# Patient Record
Sex: Male | Born: 1995 | Race: White | Hispanic: No | Marital: Single | Smoking: Former smoker
Health system: Southern US, Community
[De-identification: ages and names within clinical notes are randomized; demographics above are authoritative.]

## PROBLEM LIST (undated history)

## (undated) DIAGNOSIS — J45909 Unspecified asthma, uncomplicated: Secondary | ICD-10-CM

## (undated) HISTORY — PX: LEG SURGERY: SHX1003

---
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
## 2020-12-06 ENCOUNTER — Emergency Department (HOSPITAL_COMMUNITY): Payer: Medicaid Other

## 2020-12-06 ENCOUNTER — Emergency Department (HOSPITAL_COMMUNITY)
Admission: EM | Admit: 2020-12-06 | Discharge: 2020-12-07 | Disposition: A | Discharge status: Home / Self Care | Payer: Medicaid Other | Attending: Emergency Medicine | Admitting: Emergency Medicine

## 2020-12-06 DIAGNOSIS — R52 Pain, unspecified: Secondary | ICD-10-CM

## 2020-12-06 DIAGNOSIS — W1789XA Other fall from one level to another, initial encounter: Secondary | ICD-10-CM | POA: Insufficient documentation

## 2020-12-06 DIAGNOSIS — S93401A Sprain of unspecified ligament of right ankle, initial encounter: Secondary | ICD-10-CM | POA: Insufficient documentation

## 2020-12-06 DIAGNOSIS — S93402A Sprain of unspecified ligament of left ankle, initial encounter: Secondary | ICD-10-CM | POA: Insufficient documentation

## 2020-12-06 MED ORDER — IBUPROFEN 400 MG PO TABS
600.0000 mg | ORAL_TABLET | Freq: Once | ORAL | Status: AC
  Administered 2020-12-06: 600 mg via ORAL

## 2020-12-06 NOTE — ED Provider Notes (Signed)
Emergency Medicine Provider Triage Evaluation Note  [name] , a 25 y.o. male  was evaluated in triage.  Pt complains of who presents with concern for bilateral ankle swelling and pain after he tripped off of the porch and heard "popping sound" from both ankles.  Immediate pain and swelling to both ankles as well as the left foot, denies numbness, tingling, able to wiggle toes.  Not on any medications every day.  Review of Systems  Positive: Ankle swelling bilaterally, pain, left foot pain Negative: Numbness, tingling  Physical Exam  BP 140/90 (BP Location: Right Arm)   Pulse 91   Temp 98.7 F (37.1 C) (Oral)   Resp 18   SpO2 97%  Gen:   Awake, visibly uncomfortable due to ankle pain Resp:  Normal effort  MSK:   Moves extremities without difficulty  Other:  Swelling over bilateral lateral malleoli and left medial malleoli, tenderness palpation of her left distal, lateral metatarsals, normal cap refill in all 10 digits, 2+ pedal pulses bilaterally.  Medical Decision Making  Medically screening exam initiated at 11:28 PM.  Appropriate orders placed.  Christo Hain was informed that the remainder of the evaluation will be completed by another provider, this initial triage assessment does not replace that evaluation, and the importance of remaining in the ED until their evaluation is complete.  This chart was dictated using voice recognition software, Dragon. Despite the best efforts of this provider to proofread and correct errors, errors may still occur which can change documentation meaning.    Sherrilee Gilles 12/06/20 2329    Koleen Distance, MD 12/07/20 1539

## 2020-12-06 NOTE — ED Triage Notes (Signed)
Pt here via [facility], missed step & "twisted both ankles." Pulses, sensation intact distally.

## 2020-12-07 MED ORDER — KETOROLAC TROMETHAMINE 60 MG/2ML IM SOLN
60.0000 mg | Freq: Once | INTRAMUSCULAR | Status: AC
  Administered 2020-12-07: 60 mg via INTRAMUSCULAR
  Filled 2020-12-07: qty 2

## 2020-12-07 NOTE — ED Notes (Signed)
Ortho was called, and is coming

## 2020-12-07 NOTE — Progress Notes (Signed)
Orthopedic Tech Progress Note Patient Details:  [name] [birth year] [id number]  Ortho Devices Type of Ortho Device: ASO Ortho Device/Splint Location: bi-lateral Ortho Device/Splint Interventions: Ordered, Application, Adjustment   Post Interventions Patient Tolerated: Well Instructions Provided: Care of device, Adjustment of device  Trinna Post 12/07/2020, 5:08 AM

## 2020-12-07 NOTE — Discharge Instructions (Addendum)
1. Medications: alternate ibuprofen and tylenol for pain control, usual home medications 2. Treatment: rest, ice, elevate and use brace, drink plenty of fluids, gentle stretching 3. Follow Up: Please followup with orthopedics as directed or your PCP in 1 week if no improvement for discussion of your diagnoses and further evaluation after today's visit; if you do not have a primary care doctor use the resource guide provided to find one; Please return to the ER for worsening symptoms or other concerns  

## 2020-12-07 NOTE — ED Provider Notes (Signed)
[facility] EMERGENCY DEPARTMENT Provider Note   CSN: [id number] Arrival date & time: 12/06/20  2322     History Chief Complaint  Patient presents with   Ankle Pain    [name] is a 25 y.o. male presents to the Emergency Department complaining of acute, persistent bilateral ankle pain.  Patient reports he stumbled and fell off of approximately 1 foot high deck landing on his feet.  He reports that when he landed both feet inverted and he fell.  Denies hitting his head or loss of consciousness.  Reports he heard a popping noise in his ankles and has had pain since that time.  No treatments prior to arrival.  Movement and palpation make his symptoms worse.  Nothing seems to make it better.  Denies previous history of fracture.  The history is provided by the patient and medical records. No language interpreter was used.      No past medical history on file.  There are no problems to display for this patient.    No family history on file.     Home Medications Prior to Admission medications   Not on File    Allergies    Patient has no allergy information on record.  Review of Systems   Review of Systems  Constitutional:  Negative for chills and fever.  Musculoskeletal:  Positive for arthralgias and joint swelling.  Skin:  Negative for wound.   Physical Exam Updated Vital Signs BP 140/90 (BP Location: Right Arm)   Pulse 91   Temp 98.7 F (37.1 C) (Oral)   Resp 18   SpO2 97%   Physical Exam Vitals and nursing note reviewed.  Constitutional:      General: He is not in acute distress.    Appearance: He is well-developed. He is not ill-appearing.  HENT:     Head: Normocephalic.  Eyes:     General: No scleral icterus.    Conjunctiva/sclera: Conjunctivae normal.  Cardiovascular:     Rate and Rhythm: Normal rate.  Pulmonary:     Effort: Pulmonary effort is normal.  Abdominal:     General: There is no distension.  Musculoskeletal:      Cervical back: Normal range of motion.     Right lower leg: Normal.     Left lower leg: Normal.     Right ankle: Swelling present. Tenderness present. Normal range of motion. Normal pulse.     Right Achilles Tendon: No tenderness. Thompson's test negative.     Left ankle: Swelling present. Tenderness present. Normal range of motion. Normal pulse.     Left Achilles Tendon: No tenderness. Thompson's test negative.     Right foot: Normal.     Left foot: Normal.  Skin:    General: Skin is warm and dry.  Neurological:     Mental Status: He is alert.  Psychiatric:        Mood and Affect: Mood normal.    ED Results / Procedures / Treatments    Radiology DG Ankle Complete Left  Result Date: 12/07/2020 CLINICAL DATA:  Fall EXAM: LEFT ANKLE COMPLETE - 3+ VIEW COMPARISON:  None. FINDINGS: There is no evidence of fracture, dislocation, or joint effusion. There is no evidence of arthropathy or other focal bone abnormality. Soft tissues are unremarkable. IMPRESSION: Negative. Electronically Signed   By: Deatra Robinson M.D.   On: 12/07/2020 01:02   DG Ankle Complete Right  Result Date: 12/07/2020 CLINICAL DATA:  Fall EXAM: RIGHT ANKLE -  COMPLETE 3+ VIEW COMPARISON:  None. FINDINGS: There is no evidence of fracture, dislocation, or joint effusion. There is no evidence of arthropathy or other focal bone abnormality. Soft tissues are unremarkable. IMPRESSION: Negative. Electronically Signed   By: Deatra Robinson M.D.   On: 12/07/2020 01:01   DG Hand Complete Right  Result Date: 12/07/2020 CLINICAL DATA:  Fall EXAM: RIGHT HAND - COMPLETE 3+ VIEW COMPARISON:  None. FINDINGS: There is no evidence of fracture or dislocation. There is no evidence of arthropathy or other focal bone abnormality. Soft tissues are unremarkable. IMPRESSION: Negative. Electronically Signed   By: Deatra Robinson M.D.   On: 12/07/2020 01:05   DG Foot Complete Left  Result Date: 12/07/2020 CLINICAL DATA:  Fall EXAM: LEFT FOOT - COMPLETE 3+  VIEW COMPARISON:  None. FINDINGS: There is no evidence of fracture or dislocation. There is no evidence of arthropathy or other focal bone abnormality. Soft tissues are unremarkable. IMPRESSION: Negative. Electronically Signed   By: Deatra Robinson M.D.   On: 12/07/2020 01:04    Procedures Procedures   Medications Ordered in ED Medications  ibuprofen (ADVIL) tablet 600 mg (600 mg Oral Given 12/06/20 2333)  ketorolac (TORADOL) injection 60 mg (60 mg Intramuscular Given 12/07/20 0301)    ED Course  I have reviewed the triage vital signs and the nursing notes.  Pertinent labs & imaging results that were available during my care of the patient were reviewed by me and considered in my medical decision making (see chart for details).    MDM Rules/Calculators/A&P                          Tender to palpation over the bilateral lateral malleoli.  Full range of motion, normal sensation and strength 5/5 on exam.  Patient X-Ray negative for obvious fracture or dislocation. Pain managed in ED. Pt advised to follow up with orthopedics if symptoms persist for possibility of missed fracture diagnosis. Patient given brace while in ED, conservative therapy recommended and discussed. Patient will be dc home & is agreeable with above plan.  Final Clinical Impression(s) / ED Diagnoses Final diagnoses:  Sprain of right ankle, unspecified ligament, initial encounter  Sprain of left ankle, unspecified ligament, initial encounter    Rx / DC Orders ED Discharge Orders     None        Wyn Nettle, Boyd Kerbs 12/07/20 0317    Sabas Sous, MD 12/07/20 [phone]

## 2021-08-01 ENCOUNTER — Other Ambulatory Visit: Payer: Self-pay

## 2021-08-01 ENCOUNTER — Emergency Department (HOSPITAL_BASED_OUTPATIENT_CLINIC_OR_DEPARTMENT_OTHER): Payer: 59

## 2021-08-01 ENCOUNTER — Inpatient Hospital Stay (HOSPITAL_BASED_OUTPATIENT_CLINIC_OR_DEPARTMENT_OTHER)
Admission: EM | Admit: 2021-08-01 | Discharge: 2021-08-04 | DRG: 871 | Disposition: A | Discharge status: Home / Self Care | Payer: 59 | Attending: Student | Admitting: Student

## 2021-08-01 ENCOUNTER — Encounter (HOSPITAL_BASED_OUTPATIENT_CLINIC_OR_DEPARTMENT_OTHER): Payer: Self-pay | Admitting: Emergency Medicine

## 2021-08-01 DIAGNOSIS — J45901 Unspecified asthma with (acute) exacerbation: Secondary | ICD-10-CM | POA: Diagnosis not present

## 2021-08-01 DIAGNOSIS — F17200 Nicotine dependence, unspecified, uncomplicated: Secondary | ICD-10-CM | POA: Diagnosis not present

## 2021-08-01 DIAGNOSIS — A419 Sepsis, unspecified organism: Secondary | ICD-10-CM | POA: Diagnosis present

## 2021-08-01 DIAGNOSIS — E66813 Obesity, class 3: Secondary | ICD-10-CM

## 2021-08-01 DIAGNOSIS — R0602 Shortness of breath: Secondary | ICD-10-CM | POA: Diagnosis not present

## 2021-08-01 DIAGNOSIS — Z20822 Contact with and (suspected) exposure to covid-19: Secondary | ICD-10-CM | POA: Diagnosis present

## 2021-08-01 DIAGNOSIS — J4541 Moderate persistent asthma with (acute) exacerbation: Secondary | ICD-10-CM | POA: Diagnosis present

## 2021-08-01 DIAGNOSIS — J159 Unspecified bacterial pneumonia: Secondary | ICD-10-CM

## 2021-08-01 DIAGNOSIS — F17210 Nicotine dependence, cigarettes, uncomplicated: Secondary | ICD-10-CM | POA: Diagnosis present

## 2021-08-01 DIAGNOSIS — J180 Bronchopneumonia, unspecified organism: Secondary | ICD-10-CM | POA: Diagnosis present

## 2021-08-01 DIAGNOSIS — R0902 Hypoxemia: Secondary | ICD-10-CM

## 2021-08-01 DIAGNOSIS — J9601 Acute respiratory failure with hypoxia: Secondary | ICD-10-CM | POA: Diagnosis present

## 2021-08-01 DIAGNOSIS — E871 Hypo-osmolality and hyponatremia: Secondary | ICD-10-CM | POA: Diagnosis present

## 2021-08-01 DIAGNOSIS — B349 Viral infection, unspecified: Secondary | ICD-10-CM

## 2021-08-01 DIAGNOSIS — R6520 Severe sepsis without septic shock: Secondary | ICD-10-CM | POA: Diagnosis present

## 2021-08-01 DIAGNOSIS — Z6841 Body Mass Index (BMI) 40.0 and over, adult: Secondary | ICD-10-CM

## 2021-08-01 DIAGNOSIS — J189 Pneumonia, unspecified organism: Secondary | ICD-10-CM | POA: Diagnosis present

## 2021-08-01 HISTORY — DX: Unspecified asthma, uncomplicated: J45.909

## 2021-08-01 LAB — GROUP A STREP BY PCR: Group A Strep by PCR: NOT DETECTED

## 2021-08-01 LAB — COMPREHENSIVE METABOLIC PANEL
ALT: 49 U/L — ABNORMAL HIGH (ref 0–44)
AST: 28 U/L (ref 15–41)
Albumin: 4.2 g/dL (ref 3.5–5.0)
Alkaline Phosphatase: 56 U/L (ref 38–126)
Anion gap: 12 (ref 5–15)
BUN: 13 mg/dL (ref 6–20)
CO2: 24 mmol/L (ref 22–32)
Calcium: 9 mg/dL (ref 8.9–10.3)
Chloride: 99 mmol/L (ref 98–111)
Creatinine, Ser: 0.97 mg/dL (ref 0.61–1.24)
GFR, Estimated: >60 mL/min (ref >60)
Glucose, Bld: 115 mg/dL — ABNORMAL HIGH (ref 70–99)
Potassium: 3.7 mmol/L (ref 3.5–5.1)
Sodium: 135 mmol/L (ref 135–145)
Total Bilirubin: 0.5 mg/dL (ref 0.3–1.2)
Total Protein: 7.5 g/dL (ref 6.5–8.1)

## 2021-08-01 LAB — CBC WITH DIFFERENTIAL/PLATELET
Abs Immature Granulocytes: 0.06 10*3/uL (ref 0.00–0.07)
Basophils Absolute: 0 10*3/uL (ref 0.0–0.1)
Basophils Relative: 0 %
Eosinophils Absolute: 0 10*3/uL (ref 0.0–0.5)
Eosinophils Relative: 0 %
HCT: 41 % (ref 39.0–52.0)
Hemoglobin: 13.7 g/dL (ref 13.0–17.0)
Immature Granulocytes: 0 %
Lymphocytes Relative: 7 %
Lymphs Abs: 1 10*3/uL (ref 0.7–4.0)
MCH: 30.7 pg (ref 26.0–34.0)
MCHC: 33.4 g/dL (ref 30.0–36.0)
MCV: 91.9 fL (ref 80.0–100.0)
Monocytes Absolute: 1.7 10*3/uL — ABNORMAL HIGH (ref 0.1–1.0)
Monocytes Relative: 12 %
Neutro Abs: 11.2 10*3/uL — ABNORMAL HIGH (ref 1.7–7.7)
Neutrophils Relative %: 81 %
Platelets: 139 10*3/uL — ABNORMAL LOW (ref 150–400)
RBC: 4.46 MIL/uL (ref 4.22–5.81)
RDW: 12.6 % (ref 11.5–15.5)
WBC: 14 10*3/uL — ABNORMAL HIGH (ref 4.0–10.5)
nRBC: 0 % (ref 0.0–0.2)

## 2021-08-01 LAB — RESP PANEL BY RT-PCR (FLU A&B, COVID) ARPGX2
Influenza A by PCR: NEGATIVE
Influenza B by PCR: NEGATIVE
SARS Coronavirus 2 by RT PCR: NEGATIVE

## 2021-08-01 LAB — TROPONIN I (HIGH SENSITIVITY)
Troponin I (High Sensitivity): 5 ng/L (ref <18)
Troponin I (High Sensitivity): 4 ng/L (ref <18)

## 2021-08-01 LAB — BRAIN NATRIURETIC PEPTIDE: B Natriuretic Peptide: 27.7 pg/mL (ref 0.0–100.0)

## 2021-08-01 LAB — D-DIMER, QUANTITATIVE: D-Dimer, Quant: 1.01 ug/mL-FEU — ABNORMAL HIGH (ref 0.00–0.50)

## 2021-08-01 LAB — PROCALCITONIN: Procalcitonin: 0.11 ng/mL

## 2021-08-01 LAB — LIPASE, BLOOD: Lipase: 12 U/L (ref 11–51)

## 2021-08-01 MED ORDER — METHYLPREDNISOLONE SODIUM SUCC 40 MG IJ SOLR
40.0000 mg | Freq: Two times a day (BID) | INTRAMUSCULAR | Status: DC
  Administered 2021-08-01 – 2021-08-04 (×6): 40 mg via INTRAVENOUS
  Filled 2021-08-01 (×6): qty 1

## 2021-08-01 MED ORDER — METHYLPREDNISOLONE SODIUM SUCC 125 MG IJ SOLR
125.0000 mg | Freq: Once | INTRAMUSCULAR | Status: AC
  Administered 2021-08-01: 125 mg via INTRAVENOUS
  Filled 2021-08-01: qty 2

## 2021-08-01 MED ORDER — SODIUM CHLORIDE 0.9 % IV SOLN
500.0000 mg | INTRAVENOUS | Status: DC
  Administered 2021-08-01: 500 mg via INTRAVENOUS

## 2021-08-01 MED ORDER — SODIUM CHLORIDE 0.9 % IV SOLN: INTRAVENOUS | Status: DC | PRN

## 2021-08-01 MED ORDER — SODIUM CHLORIDE 0.9 % IV BOLUS
1000.0000 mL | Freq: Once | INTRAVENOUS | Status: AC
  Administered 2021-08-01: 1000 mL via INTRAVENOUS

## 2021-08-01 MED ORDER — ACETAMINOPHEN 650 MG RE SUPP: 650.0000 mg | Freq: Four times a day (QID) | RECTAL | Status: DC | PRN

## 2021-08-01 MED ORDER — SODIUM CHLORIDE 0.9 % IV SOLN
2.0000 g | INTRAVENOUS | Status: DC
  Administered 2021-08-02 – 2021-08-03 (×2): 2 g via INTRAVENOUS
  Filled 2021-08-01 (×3): qty 20

## 2021-08-01 MED ORDER — ONDANSETRON HCL 4 MG/2ML IJ SOLN: 4.0000 mg | Freq: Four times a day (QID) | INTRAMUSCULAR | Status: DC | PRN

## 2021-08-01 MED ORDER — AZITHROMYCIN 250 MG PO TABS
500.0000 mg | ORAL_TABLET | Freq: Every day | ORAL | Status: DC
  Administered 2021-08-02 – 2021-08-04 (×3): 500 mg via ORAL
  Filled 2021-08-01 (×3): qty 2

## 2021-08-01 MED ORDER — ONDANSETRON HCL 4 MG PO TABS: 4.0000 mg | ORAL_TABLET | Freq: Four times a day (QID) | ORAL | Status: DC | PRN

## 2021-08-01 MED ORDER — ACETAMINOPHEN 325 MG PO TABS: 650.0000 mg | ORAL_TABLET | Freq: Four times a day (QID) | ORAL | Status: DC | PRN

## 2021-08-01 MED ORDER — SODIUM CHLORIDE 0.9 % IV SOLN
1.0000 g | Freq: Once | INTRAVENOUS | Status: AC
  Administered 2021-08-01: 1 g via INTRAVENOUS
  Filled 2021-08-01: qty 10

## 2021-08-01 MED ORDER — SENNOSIDES-DOCUSATE SODIUM 8.6-50 MG PO TABS
1.0000 | ORAL_TABLET | Freq: Every evening | ORAL | Status: DC | PRN
  Administered 2021-08-02: 1 via ORAL
  Filled 2021-08-01: qty 1

## 2021-08-01 MED ORDER — SODIUM CHLORIDE 0.9 % IV SOLN
500.0000 mg | Freq: Once | INTRAVENOUS | Status: DC
  Filled 2021-08-01: qty 5

## 2021-08-01 MED ORDER — SODIUM CHLORIDE 0.9 % IV SOLN: 1.0000 g | INTRAVENOUS | Status: DC

## 2021-08-01 MED ORDER — ALBUTEROL SULFATE (2.5 MG/3ML) 0.083% IN NEBU: 2.5000 mg | INHALATION_SOLUTION | RESPIRATORY_TRACT | Status: DC | PRN

## 2021-08-01 MED ORDER — HYDROCODONE-ACETAMINOPHEN 5-325 MG PO TABS: 1.0000 | ORAL_TABLET | ORAL | Status: DC | PRN

## 2021-08-01 MED ORDER — MAGNESIUM SULFATE 2 GM/50ML IV SOLN
2.0000 g | Freq: Once | INTRAVENOUS | Status: AC
  Administered 2021-08-01: 2 g via INTRAVENOUS
  Filled 2021-08-01: qty 50

## 2021-08-01 MED ORDER — IPRATROPIUM-ALBUTEROL 0.5-2.5 (3) MG/3ML IN SOLN
3.0000 mL | Freq: Once | RESPIRATORY_TRACT | Status: AC
  Administered 2021-08-01: 3 mL via RESPIRATORY_TRACT
  Filled 2021-08-01: qty 3

## 2021-08-01 MED ORDER — GUAIFENESIN 100 MG/5ML PO LIQD
5.0000 mL | ORAL | Status: DC | PRN
  Administered 2021-08-02 – 2021-08-03 (×2): 5 mL via ORAL
  Filled 2021-08-01 (×2): qty 10

## 2021-08-01 MED ORDER — ACETAMINOPHEN 325 MG PO TABS
650.0000 mg | ORAL_TABLET | Freq: Once | ORAL | Status: AC
  Administered 2021-08-01: 650 mg via ORAL
  Filled 2021-08-01: qty 2

## 2021-08-01 MED ORDER — IOHEXOL 350 MG/ML SOLN
100.0000 mL | Freq: Once | INTRAVENOUS | Status: AC | PRN
  Administered 2021-08-01: 100 mL via INTRAVENOUS

## 2021-08-01 MED ORDER — ENOXAPARIN SODIUM 60 MG/0.6ML IJ SOSY
60.0000 mg | PREFILLED_SYRINGE | INTRAMUSCULAR | Status: DC
  Administered 2021-08-01 – 2021-08-03 (×3): 60 mg via SUBCUTANEOUS
  Filled 2021-08-01 (×3): qty 0.6

## 2021-08-01 NOTE — Progress Notes (Signed)
Contacted by RN Dorothe Pea regarding a patient that has not been admitted with no HPI or plan of care. I informed the RN  that I am the cross covering provider and unable to provide any further guidance without completed HPI/plan of care. I have advised the RN to contact the appropriate provider for further guidance and or plan of care.      Webb Silversmith, DNP, CCRN, FNP-C, AGACNP-BC Acute Care Nurse Practitioner  [facility] Pager: [phone] [facility]

## 2021-08-01 NOTE — ED Provider Notes (Signed)
[facility] EMERGENCY DEPT Provider Note   CSN: [id number] Arrival date & time: 08/01/21  1117     History  Chief Complaint  Patient presents with   Shortness of Breath    Nagi Lisonbee is a 26 y.o. male.  This is a 26 y.o. male  with significant medical history as below, including tobacco abuse (2ppd, asthma) who presents to the ED with complaint of dib, cp, cough  Patient ports 3 days of chest discomfort, tightness, difficulty breathing.  Exertional dyspnea.  Fever, chills.  Productive cough with yellow/green phlegm.  Nausea and intermittent emesis.  Last emesis yesterday evening.  Reduced p.o. intake over the past 2 days secondary to nausea.  No abdominal pain.  No sick contacts.  Recently traveled to the area for work.  He has asthma inhaler for home but does not typically have to use it, denies intubation history, denies prior hospitalization secondary to asthma exacerbation.  Does not follow with asthma specialist or pulmonologist.  Last tobacco use was 3 days ago.  Approx 2 pack/day x4 years or so.  No illicit drug use.  No history of DVT or PE.   Past Medical History: No date: Asthma  History reviewed. No pertinent surgical history.    The history is provided by the patient. No language interpreter was used.  Shortness of Breath Associated symptoms: chest pain, cough, fever and vomiting   Associated symptoms: no abdominal pain, no headaches and no rash       Home Medications Prior to Admission medications   Not on File      Allergies    Patient has no known allergies.    Review of Systems   Review of Systems  Constitutional:  Positive for chills and fever.  HENT:  Negative for facial swelling and trouble swallowing.   Eyes:  Negative for photophobia and visual disturbance.  Respiratory:  Positive for cough and shortness of breath.   Cardiovascular:  Positive for chest pain. Negative for palpitations.  Gastrointestinal:  Positive for nausea  and vomiting. Negative for abdominal pain.  Endocrine: Negative for polydipsia and polyuria.  Genitourinary:  Negative for difficulty urinating and hematuria.  Musculoskeletal:  Negative for gait problem and joint swelling.  Skin:  Negative for pallor and rash.  Neurological:  Negative for syncope and headaches.  Psychiatric/Behavioral:  Negative for agitation and confusion.    Physical Exam Updated Vital Signs BP 122/60 (BP Location: Right Arm)    Pulse (!) 112    Temp (!) 101.4 F (38.6 C)    Resp (!) 23    Wt 122.5 kg    SpO2 94%  Physical Exam Vitals and nursing note reviewed.  Constitutional:      General: He is not in acute distress.    Appearance: He is well-developed. He is obese. He is diaphoretic.  HENT:     Head: Normocephalic and atraumatic.     Right Ear: External ear normal.     Left Ear: External ear normal.     Mouth/Throat:     Mouth: Mucous membranes are moist.     Pharynx: Uvula midline. No oropharyngeal exudate.     Comments: Mild erythema to posterior oropharynx, uvula is midline. Eyes:     General: No scleral icterus. Cardiovascular:     Rate and Rhythm: Regular rhythm. Tachycardia present.     Pulses: Normal pulses.     Heart sounds: Normal heart sounds.  Pulmonary:     Effort: Tachypnea present. No respiratory distress.  Breath sounds: Decreased breath sounds and wheezing present.     Comments: Wheezing right greater than left.  No stridor Abdominal:     General: Abdomen is flat.     Palpations: Abdomen is soft.     Tenderness: There is no abdominal tenderness.  Musculoskeletal:        General: Normal range of motion.     Cervical back: Normal range of motion.     Right lower leg: No edema.     Left lower leg: No edema.  Skin:    General: Skin is warm.     Capillary Refill: Capillary refill takes less than 2 seconds.  Neurological:     Mental Status: He is alert and oriented to person, place, and time.  Psychiatric:        Mood and Affect:  Mood normal.        Behavior: Behavior normal.    ED Results / Procedures / Treatments   Labs (all labs ordered are listed, but only abnormal results are displayed) Labs Reviewed  CBC WITH DIFFERENTIAL/PLATELET - Abnormal; Notable for the following components:      Result Value   WBC 14.0 (*)    Platelets 139 (*)    Neutro Abs 11.2 (*)    Monocytes Absolute 1.7 (*)    All other components within normal limits  D-DIMER, QUANTITATIVE - Abnormal; Notable for the following components:   D-Dimer, Quant 1.01 (*)    All other components within normal limits  COMPREHENSIVE METABOLIC PANEL - Abnormal; Notable for the following components:   Glucose, Bld 115 (*)    ALT 49 (*)    All other components within normal limits  RESP PANEL BY RT-PCR (FLU A&B, COVID) [id number]  GROUP A STREP BY PCR  BRAIN NATRIURETIC PEPTIDE  LIPASE, BLOOD  TROPONIN I (HIGH SENSITIVITY)  TROPONIN I (HIGH SENSITIVITY)    EKG EKG Interpretation  Date/Time:  Sunday August 01 2021 11:32:21 EST Ventricular Rate:  118 PR Interval:  108 QRS Duration: 95 QT Interval:  315 QTC Calculation: 442 R Axis:   82 Text Interpretation: Sinus tachycardia No old tracing to compare Confirmed by Tanda RockersGray, Italo Banton ([id number]) on 08/01/2021 3:31:50 PM  Radiology DG Chest Portable 1 View  Result Date: 08/01/2021 CLINICAL DATA:  Productive cough and fever for 3 days. EXAM: PORTABLE CHEST 1 VIEW COMPARISON:  None. FINDINGS: Normal heart, mediastinum and hila. Clear lungs.  No pleural effusion or pneumothorax. Skeletal structures are grossly intact. IMPRESSION: No active disease. Electronically Signed   By: Amie Portlandavid  Ormond M.D.   On: 08/01/2021 11:57    Procedures .Critical Care Performed by: Sloan LeiterGray, Keiondre Colee A, DO Authorized by: Sloan LeiterGray, Imagine Nest A, DO   Critical care provider statement:    Critical care time (minutes):  49   Critical care time was exclusive of:  Separately billable procedures and treating other patients   Critical care was  necessary to treat or prevent imminent or life-threatening deterioration of the following conditions:  Respiratory failure   Critical care was time spent personally by me on the following activities:  Development of treatment plan with patient or surrogate, discussions with consultants, evaluation of patient's response to treatment, examination of patient, ordering and review of laboratory studies, ordering and review of radiographic studies, ordering and performing treatments and interventions, pulse oximetry, re-evaluation of patient's condition, review of old charts and obtaining history from patient or surrogate    Medications Ordered in ED Medications  0.9 %  sodium chloride infusion (  Intravenous Stopped 08/01/21 1433)  acetaminophen (TYLENOL) tablet 650 mg (650 mg Oral Given 08/01/21 1142)  ipratropium-albuterol (DUONEB) 0.5-2.5 (3) MG/3ML nebulizer solution 3 mL (3 mLs Nebulization Given 08/01/21 1151)  sodium chloride 0.9 % bolus 1,000 mL ( Intravenous Stopped 08/01/21 1433)  magnesium sulfate IVPB 2 g 50 mL (0 g Intravenous Stopped 08/01/21 1427)  ipratropium-albuterol (DUONEB) 0.5-2.5 (3) MG/3ML nebulizer solution 3 mL (3 mLs Nebulization Given 08/01/21 1225)  methylPREDNISolone sodium succinate (SOLU-MEDROL) 125 mg/2 mL injection 125 mg (125 mg Intravenous Given 08/01/21 1313)  iohexol (OMNIPAQUE) 350 MG/ML injection 100 mL (100 mLs Intravenous Contrast Given 08/01/21 1442)    ED Course/ Medical Decision Making/ A&P                           Medical Decision Making  CC: Dyspnea  This patient presents to the Emergency Department for the above complaint. This involves an extensive number of treatment options and is a complaint that carries with it a high risk of complications and morbidity. Vital signs were reviewed. Serious etiologies considered.  Low risk Wells score, patient with hypoxia, dyspnea, tachycardia, works as a Administrator.  Will obtain D-dimer  Record review:  Previous  records obtained and reviewed   Medical and surgical history as noted above.   Work up as above, notable for:   Labs & imaging results that were available during my care of the patient were visualized by me and considered in my medical decision making.   I ordered imaging studies which included chest x-ray and I visualized the imaging and I agree with radiologist interpretation.  No acute process identified  Cardiac monitoring reviewed and interpreted personally which shows sinus tachycardia  Social determinants of health include - N/a  Viral swabs negative.  Chest x-ray without evidence of pneumonia.  Patient febrile.  Given antipyretic.  Will obtain laboratory evaluation, IV access.  Management: Nebulized breathing treatments, IV magnesium, steroids, antipyretic  Labs reviewed, D-dimer was elevated so will order CTPE. He has mild leukocytosis, febrile, tachycardia. Hypoxia. Presume likely viral syndrome that has triggered an acute asthma exacerbation. PE study is pending. CXR shows no pneumonia. He is requiring supplemental oxygen despite nebulized breathing treatments/ steroids. Viral swab neg.   Pt handoff given to incoming EDP Dr Sherry Ruffing pending CTPE. Pt continues to require supplemental oxygen, would likely require admission for acute asthma exacerbation.    Counseled patient for approximately 4 minutes regarding smoking cessation. Discussed risks of smoking and how they applied and affected their visit here today. Patient not ready to quit at this time, however will follow up with their primary doctor when they are.   CPT code: [phone]: intermediate counseling for smoking cessation      This chart was dictated using voice recognition software.  Despite best efforts to proofread,  errors can occur which can change the documentation meaning.    Amount and/or Complexity of Data Reviewed External Data Reviewed: labs, radiology and notes. Labs: ordered. Decision-making details  documented in ED Course. Radiology: ordered. Decision-making details documented in ED Course. ECG/medicine tests: ordered and independent interpretation performed. Decision-making details documented in ED Course.  Risk OTC drugs. Prescription drug management. Decision regarding hospitalization.           Final Clinical Impression(s) / ED Diagnoses Final diagnoses:  Viral syndrome  Moderate persistent asthma with exacerbation    Rx / DC Orders ED Discharge Orders     None  Jeanell Sparrow, DO 08/01/21 1534

## 2021-08-01 NOTE — ED Triage Notes (Signed)
Reports shortness of breath x 3 days  , unable to speak full sentence , dyspnea at rest , Hx asthma , nor  inhaler or treatment .  Productive cough x 3 days . Intermittent fever .

## 2021-08-01 NOTE — Progress Notes (Signed)
26 year old with past medical history of asthma comes into the hospital for 3 days of chest discomfort difficulty breathing exertional dyspnea, fever, productive cough in the ED was found to be hypoxic placed on 2 L of oxygen saturations improved to greater than 90% CT angio of the chest was done to rule out PE which was negative but it did show extensive bilateral atypical pneumonia started empirically on IV Rocephin and azithromycin and steroids and we were consulted for admissions at [facility] long hospital admitted him to [place] bed

## 2021-08-01 NOTE — ED Provider Notes (Signed)
3:10 PM Care assumed from Dr. Pearline Cables.  At time of transfer care, patient is awaiting results of CT PE study and reassessment.  Patient is hypoxic on several liters nasal cannula with suspected asthma exacerbation in the setting of viral infection.  Plan of care will be to reassess but previous team anticipated admission.  4:59 PM I just reassessed patient and is starting to feel a little bit better.  He is still on nasal cannula oxygen supplementation which she does not normally use at home.  His CT scan did not show blood clot but does show bronchopneumonia.  Due to his new oxygen requirement, will order antibiotics and call for admission.  Clinical Impression: 1. Moderate persistent asthma with exacerbation   2. Viral syndrome   3. Hypoxia   4. Community acquired pneumonia, unspecified laterality     Disposition: Admit  This note was prepared with assistance of Systems analyst. Occasional wrong-word or sound-a-like substitutions may have occurred due to the inherent limitations of voice recognition software.     Maurisa Tesmer, Gwenyth Allegra, MD 08/01/21 2041

## 2021-08-01 NOTE — Discharge Instructions (Signed)
It was a pleasure caring for you today in the emergency department. ° °Please return to the emergency department for any worsening or worrisome symptoms. ° ° °

## 2021-08-01 NOTE — H&P (Signed)
History and Physical    Naftula Donahue EQA:[id number] DOB: 1996-03-04 DOA: 08/01/2021  PCP: Pcp, No   Patient coming from: Home   Chief Complaint: SOB, fever, cough, wheeze   HPI: Remo Kirschenmann is a very pleasant 26 y.o. male with medical history significant for asthma and BMI 45, now presenting to the emergency department with 3 days of fever, productive cough, wheezing, and worsening shortness of breath.  Patient reports that he had been in his usual state of health prior to onset of the symptoms, does not know of any sick contacts, denies leg swelling or tenderness, and has not had any significant relief from NyQuil, DayQuil, and ibuprofen.  He has not had an asthma flare in a couple years.  He was smoking 2 packs/day but not in the past 3 days due to the acute illness and plans to quit at this point.  [facility] ED Course: Upon arrival to the ED, patient is found to be febrile to 38.6 C, saturating in the 80s on room air, tachypneic as high as 32, tachycardic to 120, and with stable blood pressure.  EKG features sinus tachycardia with rate 118.  Blood work notable for WBC 14,000 and platelets 139,000.  CTA chest is negative for PE but concerning for extensive bronchopneumonia in the right lower lobe and minimal patchy involvement in the right middle and left lower lobes.  Patient was given a liter of saline, acetaminophen, nebs, IV Solu-Medrol, IV magnesium, Rocephin, and azithromycin in the emergency department.  He was transported to [facility] for admission.  Review of Systems:  All other systems reviewed and apart from HPI, are negative.  Past Medical History:  Diagnosis Date   Asthma     History reviewed. No pertinent surgical history.  Social History:   reports that he has been smoking cigarettes. He has been smoking an average of 2 packs per day. He does not have any smokeless tobacco history on file. No history on file for alcohol use and drug use.  No Known  Allergies  History reviewed. No pertinent family history.   Prior to Admission medications   Not on File    Physical Exam: Vitals:   08/01/21 1804 08/01/21 1900 08/01/21 2041 08/01/21 2046  BP:  128/64  (!) 147/89  Pulse:  (!) 111  (!) 102  Resp:  (!) 24  (!) 23  Temp: 99 F (37.2 C)   99.1 F (37.3 C)  TempSrc: Oral   Oral  SpO2:  96%  93%  Weight:   (!) 136.8 kg   Height:   5\' 9"  (1.753 m)     Constitutional: NAD, calm  Eyes: PERTLA, lids and conjunctivae normal ENMT: Mucous membranes are moist. Posterior pharynx clear of any exudate or lesions.   Neck: supple, no masses  Respiratory: Diminished breath sounds bilaterally with prolonged expiatrory phase and end-expiratory wheezes. Tachypneic. Speaking full sentences.  Cardiovascular: S1 & S2 heard, regular rate and rhythm. No extremity edema. No significant JVD. Abdomen: No distension, no tenderness, soft. Bowel sounds active.  Musculoskeletal: no clubbing / cyanosis. No joint deformity upper and lower extremities.   Skin: no significant rashes, lesions, ulcers. Warm, dry, well-perfused. Neurologic: CN 2-12 grossly intact. Moving all extremities. Alert and oriented.  Psychiatric: Very pleasant. Cooperative.    Labs and Imaging on Admission: I have personally reviewed following labs and imaging studies  CBC: Recent Labs  Lab 08/01/21 1240  WBC 14.0*  NEUTROABS 11.2*  HGB 13.7  HCT 41.0  MCV 91.9  PLT 139*   Basic Metabolic Panel: Recent Labs  Lab 08/01/21 1240  NA 135  K 3.7  CL 99  CO2 24  GLUCOSE 115*  BUN 13  CREATININE 0.97  CALCIUM 9.0   GFR: Estimated Creatinine Clearance: 157.1 mL/min (by C-G formula based on SCr of 0.97 mg/dL). Liver Function Tests: Recent Labs  Lab 08/01/21 1240  AST 28  ALT 49*  ALKPHOS 56  BILITOT 0.5  PROT 7.5  ALBUMIN 4.2   Recent Labs  Lab 08/01/21 1240  LIPASE 12   No results for input(s): AMMONIA in the last 168 hours. Coagulation Profile: No results  for input(s): INR, PROTIME in the last 168 hours. Cardiac Enzymes: No results for input(s): CKTOTAL, CKMB, CKMBINDEX, TROPONINI in the last 168 hours. BNP (last 3 results) No results for input(s): PROBNP in the last 8760 hours. HbA1C: No results for input(s): HGBA1C in the last 72 hours. CBG: No results for input(s): GLUCAP in the last 168 hours. Lipid Profile: No results for input(s): CHOL, HDL, LDLCALC, TRIG, CHOLHDL, LDLDIRECT in the last 72 hours. Thyroid Function Tests: No results for input(s): TSH, T4TOTAL, FREET4, T3FREE, THYROIDAB in the last 72 hours. Anemia Panel: No results for input(s): VITAMINB12, FOLATE, FERRITIN, TIBC, IRON, RETICCTPCT in the last 72 hours. Urine analysis: No results found for: COLORURINE, APPEARANCEUR, LABSPEC, PHURINE, GLUCOSEU, HGBUR, BILIRUBINUR, KETONESUR, PROTEINUR, UROBILINOGEN, NITRITE, LEUKOCYTESUR Sepsis Labs: @LABRCNTIP (procalcitonin:4,lacticidven:4) ) Recent Results (from the past 240 hour(s))  Resp Panel by RT-PCR (Flu A&B, Covid) Nasopharyngeal Swab     Status: None   Collection Time: 08/01/21 11:25 AM   Specimen: Nasopharyngeal Swab; Nasopharyngeal(NP) swabs in vial transport medium  Result Value Ref Range Status   SARS Coronavirus 2 by RT PCR NEGATIVE NEGATIVE Final    Comment: (NOTE) SARS-CoV-2 target nucleic acids are NOT DETECTED.  The SARS-CoV-2 RNA is generally detectable in upper respiratory specimens during the acute phase of infection. The lowest concentration of SARS-CoV-2 viral copies this assay can detect is 138 copies/mL. A negative result does not preclude SARS-Cov-2 infection and should not be used as the sole basis for treatment or other patient management decisions. A negative result may occur with  improper specimen collection/handling, submission of specimen other than nasopharyngeal swab, presence of viral mutation(s) within the areas targeted by this assay, and inadequate number of viral copies(<138 copies/mL).  A negative result must be combined with clinical observations, patient history, and epidemiological information. The expected result is Negative.  Fact Sheet for Patients:  BloggerCourse.com  Fact Sheet for Healthcare Providers:  SeriousBroker.it  This test is no t yet approved or cleared by the [place] FDA and  has been authorized for detection and/or diagnosis of SARS-CoV-2 by FDA under an Emergency Use Authorization (EUA). This EUA will remain  in effect (meaning this test can be used) for the duration of the COVID-19 declaration under Section 564(b)(1) of the Act, 21 U.S.C.section 360bbb-3(b)(1), unless the authorization is terminated  or revoked sooner.       Influenza A by PCR NEGATIVE NEGATIVE Final   Influenza B by PCR NEGATIVE NEGATIVE Final    Comment: (NOTE) The Xpert Xpress SARS-CoV-2/FLU/RSV plus assay is intended as an aid in the diagnosis of influenza from Nasopharyngeal swab specimens and should not be used as a sole basis for treatment. Nasal washings and aspirates are unacceptable for Xpert Xpress SARS-CoV-2/FLU/RSV testing.  Fact Sheet for Patients: BloggerCourse.com  Fact Sheet for Healthcare Providers: SeriousBroker.it  This test is not yet  approved or cleared by the [place] and has been authorized for detection and/or diagnosis of SARS-CoV-2 by FDA under an Emergency Use Authorization (EUA). This EUA will remain in effect (meaning this test can be used) for the duration of the COVID-19 declaration under Section 564(b)(1) of the Act, 21 U.S.C. section 360bbb-3(b)(1), unless the authorization is terminated or revoked.  Performed at [facility], [address] [place], [place] [address]   Group A Strep by PCR     Status: None   Collection Time: 08/01/21 12:19 PM   Specimen: Throat; Sterile Swab  Result Value Ref  Range Status   Group A Strep by PCR NOT DETECTED NOT DETECTED Final    Comment: Performed at [facility], [address], [place], [place] [address]     Radiological Exams on Admission: CT Angio Chest PE W and/or Wo Contrast  Result Date: 08/01/2021 CLINICAL DATA:  Pulmonary embolism suspected. Positive D-dimer. Cough and congestion. EXAM: CT ANGIOGRAPHY CHEST WITH CONTRAST TECHNIQUE: Multidetector CT imaging of the chest was performed using the standard protocol during bolus administration of intravenous contrast. Multiplanar CT image reconstructions and MIPs were obtained to evaluate the vascular anatomy. RADIATION DOSE REDUCTION: This exam was performed according to the departmental dose-optimization program which includes automated exposure control, adjustment of the mA and/or kV according to patient size and/or use of iterative reconstruction technique. CONTRAST:  OMNIPAQUE IOHEXOL 350 MG/ML SOLN COMPARISON:  Chest radiography same day FINDINGS: Cardiovascular: Heart size is normal. No pericardial fluid. No visible coronary artery calcification or aortic atherosclerotic calcification. Pulmonary arterial opacification is moderate. No pulmonary emboli are identified. Mediastinum/Nodes: Mild reactive nodal prominence. Lungs/Pleura: Extensive bronchopneumonia within the right lower lobe with minimal involvement of the right middle lobe. Right upper lobe is clear. Mild patchy infiltrate in the left lower lobe. Left upper lobe is clear. No pleural effusion. Upper Abdomen: Normal Musculoskeletal: Normal Review of the MIP images confirms the above findings. IMPRESSION: No pulmonary emboli. Extensive bronchopneumonia in the right lower lobe. Minimal patchy involvement also of the right middle lobe and left lower lobe. Electronically Signed   By: Paulina Fusi M.D.   On: 08/01/2021 15:50   DG Chest Portable 1 View  Result Date: 08/01/2021 CLINICAL DATA:  Productive cough and fever  for 3 days. EXAM: PORTABLE CHEST 1 VIEW COMPARISON:  None. FINDINGS: Normal heart, mediastinum and hila. Clear lungs.  No pleural effusion or pneumothorax. Skeletal structures are grossly intact. IMPRESSION: No active disease. Electronically Signed   By: Amie Portland M.D.   On: 08/01/2021 11:57    EKG: Independently reviewed. Sinus tachycardia, rate 118.   Assessment/Plan   1. Pneumonia; asthma exacerbation; acute hypoxic respiratory failure  - Pt presents with 3 days of fever and worsening productive cough, wheeze, and SOB - O2 saturation was 80s on rm air  - He was treated with Rocephin, azithromycin, IV Solu-Medrol, IV magnesium, IVF, and nebs in ED  - Check strep pneumo and legionella antigens, continue antibiotics, SABA, systemic steroid, supplemental O2 as needed, and trend procalcitonin    DVT prophylaxis: Lovenox  Code Status: Full  Level of Care: Level of care: Med-Surg Family Communication: none present  Disposition Plan:  Patient is from: home  Anticipated d/c is to: Home  Anticipated d/c date is: 2/29/23  Patient currently: pending improved respiratory status, conversion to oral medications  Consults called: none  Admission status: Inpatient    Briscoe Deutscher, MD [facility]  08/01/2021, 9:20 PM

## 2021-08-01 NOTE — Progress Notes (Signed)
°   08/01/21 2046  Assess: MEWS Score  Temp 99.1 F (37.3 C)  BP (!) 147/89  Pulse Rate (!) 102  Resp (!) 23  SpO2 93 %  O2 Device Nasal Cannula  O2 Flow Rate (L/min) 2 L/min  Assess: MEWS Score  MEWS Temp 0  MEWS Systolic 0  MEWS Pulse 1  MEWS RR 1  MEWS LOC 0  MEWS Score 2  MEWS Score Color Yellow  Assess: if the MEWS score is Yellow or Red  Were vital signs taken at a resting state? Yes  Focused Assessment No change from prior assessment  Does the patient meet 2 or more of the SIRS criteria? No  MEWS guidelines implemented *See Row Information* Yes  Treat  MEWS Interventions Administered scheduled meds/treatments;Administered prn meds/treatments  Take Vital Signs  Increase Vital Sign Frequency  Yellow: Q 2hr X 2 then Q 4hr X 2, if remains yellow, continue Q 4hrs  Escalate  MEWS: Escalate Yellow: discuss with charge nurse/RN and consider discussing with provider and RRT  Notify: Charge Nurse/RN  Name of Charge Nurse/RN Notified Meredith, RN  Date Charge Nurse/RN Notified 08/01/21  Time Charge Nurse/RN Notified 2048  Notify: Provider  Provider Name/Title Ouma, NP  Date Provider Notified 08/01/21  Time Provider Notified 2048  Notification Type Page  Notification Reason Other (Comment) (pt yellow MEWs d/t increased pulse and increased respirations)  Assess: SIRS CRITERIA  SIRS Temperature  0  SIRS Pulse 1  SIRS Respirations  1  SIRS WBC 0  SIRS Score Sum  2   Pt just arrived to floor - yellow MEWs d/t increased pulse and increased respirations d/t asthma exacerbation. Charge RN notified, covering provider and admitting provider notified. All other vital signs are WDL. Yellow MEWs guidelines implemented. Plan of care continues.

## 2021-08-02 ENCOUNTER — Encounter (HOSPITAL_COMMUNITY): Payer: Self-pay | Admitting: Family Medicine

## 2021-08-02 DIAGNOSIS — J9601 Acute respiratory failure with hypoxia: Secondary | ICD-10-CM | POA: Diagnosis not present

## 2021-08-02 LAB — TSH: TSH: 0.498 u[IU]/mL (ref 0.350–4.500)

## 2021-08-02 LAB — CBC
HCT: 40.5 % (ref 39.0–52.0)
Hemoglobin: 13.7 g/dL (ref 13.0–17.0)
MCH: 31.4 pg (ref 26.0–34.0)
MCHC: 33.8 g/dL (ref 30.0–36.0)
MCV: 92.9 fL (ref 80.0–100.0)
Platelets: 165 10*3/uL (ref 150–400)
RBC: 4.36 MIL/uL (ref 4.22–5.81)
RDW: 12.4 % (ref 11.5–15.5)
WBC: 12 10*3/uL — ABNORMAL HIGH (ref 4.0–10.5)
nRBC: 0 % (ref 0.0–0.2)

## 2021-08-02 LAB — BASIC METABOLIC PANEL
Anion gap: 5 (ref 5–15)
BUN: 11 mg/dL (ref 6–20)
CO2: 24 mmol/L (ref 22–32)
Calcium: 8.4 mg/dL — ABNORMAL LOW (ref 8.9–10.3)
Chloride: 105 mmol/L (ref 98–111)
Creatinine, Ser: 0.82 mg/dL (ref 0.61–1.24)
GFR, Estimated: >60 mL/min (ref >60)
Glucose, Bld: 153 mg/dL — ABNORMAL HIGH (ref 70–99)
Potassium: 4.1 mmol/L (ref 3.5–5.1)
Sodium: 134 mmol/L — ABNORMAL LOW (ref 135–145)

## 2021-08-02 LAB — EXPECTORATED SPUTUM ASSESSMENT W GRAM STAIN, RFLX TO RESP C

## 2021-08-02 LAB — BRAIN NATRIURETIC PEPTIDE: B Natriuretic Peptide: 146.1 pg/mL — ABNORMAL HIGH (ref 0.0–100.0)

## 2021-08-02 LAB — HIV ANTIBODY (ROUTINE TESTING W REFLEX): HIV Screen 4th Generation wRfx: NONREACTIVE

## 2021-08-02 LAB — STREP PNEUMONIAE URINARY ANTIGEN: Strep Pneumo Urinary Antigen: NEGATIVE

## 2021-08-02 LAB — HEMOGLOBIN A1C
Hgb A1c MFr Bld: 5.3 % (ref 4.8–5.6)
Mean Plasma Glucose: 105.41 mg/dL

## 2021-08-02 LAB — PROCALCITONIN: Procalcitonin: 0.1 ng/mL

## 2021-08-02 MED ORDER — GUAIFENESIN ER 600 MG PO TB12
600.0000 mg | ORAL_TABLET | Freq: Two times a day (BID) | ORAL | Status: DC
  Administered 2021-08-02 – 2021-08-04 (×5): 600 mg via ORAL
  Filled 2021-08-02 (×5): qty 1

## 2021-08-02 MED ORDER — PANTOPRAZOLE SODIUM 40 MG PO TBEC
40.0000 mg | DELAYED_RELEASE_TABLET | Freq: Every day | ORAL | Status: DC
  Administered 2021-08-02 – 2021-08-04 (×3): 40 mg via ORAL
  Filled 2021-08-02 (×3): qty 1

## 2021-08-02 MED ORDER — CALCIUM CARBONATE ANTACID 500 MG PO CHEW
1.0000 | CHEWABLE_TABLET | Freq: Three times a day (TID) | ORAL | Status: DC | PRN
  Administered 2021-08-02: 200 mg via ORAL
  Filled 2021-08-02: qty 1

## 2021-08-02 MED ORDER — GUAIFENESIN 100 MG/5ML PO LIQD
10.0000 mL | ORAL | Status: DC | PRN
  Administered 2021-08-02 – 2021-08-04 (×5): 10 mL via ORAL
  Filled 2021-08-02 (×5): qty 10

## 2021-08-02 NOTE — Hospital Course (Addendum)
26 year old male with PMH of mild intermittent asthma, morbid obesity and tobacco use disorder presenting with productive cough, wheezing, dyspnea, and admitted for severe sepsis due to bronchopneumonia and asthma exacerbation with hypoxia in 80s on room air.  He was tachycardic to 120s and had leukocytosis of 14,000.  CTA negative for PE but extensive bronchopneumonia in RLL and minimally patchy involvement in RML and LLL.  Serial troponin and BNP negative.  Pro-Cal negative as well.  Patient was started on CAP coverage with CTX and azithromycin, IV Solu-Medrol and breathing treatment with improvement in his symptoms.  Eventually, he was liberated off oxygen.  He completed 3 days course of Zithromax in the house.  Discharged on p.o. Augmentin and p.o. prednisone for 2 more days.  Was given prescription for albuterol inhaler.  He was counseled on smoking cessation and provided with resources.  Transition of care assisted with PCP set up  See individual problem list below for more on hospital course.

## 2021-08-02 NOTE — Progress Notes (Signed)
PROGRESS NOTE [name]  XBD:[id number] DOB: 1995-08-01 DOA: 08/01/2021 PCP: Pcp, No   Brief Narrative/Hospital Course:  26 y.o. male with medical history significant for asthma and morbid obesity with BMI 45 presented with 3 days of fever, productive cough, wheezing dyspnea and shortness of breath.  No recent asthma flareup and not on inhaler regularly but active smoker which he quit with onset of this episode-was smoking 2 packs/day In the ED-vitals are stable with tachypnea hypoxia.  80s on room air tachycardic to 120s EKG with sinus tachycardia , Labs-WBC 14,000 and platelets 139,000.CTA- no PE-extensive bronchopneumonia in the right lower lobe. Minimal patchy involvement also of the right middle lobe and left lower lobe. troponin negative x2, bnp 27 Pro-Cal 0.1. Patient was given IV fluids antibiotics steroids and admitted     Subjective: Seen this morning patient reports he feels some better, having dry cough unable to get phlegm out, had some wheezing chest tightness but overall feels better Leukocytosis downtrending, troponin negative x2, bnp 27 Pro-Cal 0.1  Assessment and Plan: * Acute respiratory failure with hypoxia (HCC)- (present on admission) Acute hypoxic respiratory failure Extensive bronchopneumonia mostly in the right lower lobe Mild intermittent asthma with acute exacerbation: We will continue with IV antibiotics, Solu-Medrol bronchodilator, supplemental oxygen, antitussives.  Wean oxygen as tolerated,OOB incentive spirometry. Tachycardia improving.  Leukocytosis downtrending but could worsen. Monitor respiratory status closely-if does not improve quickly we will obtain pulmonary eval. encouraged tobacco cessation  Pneumonia- (present on admission) See #1  Asthma, chronic, unspecified asthma severity, with acute exacerbation See above  Obesity, Class III, BMI 40-49.9 (morbid obesity) (HCC) BMI 44 will benefit with weight loss healthy lifestyle PCP follow-up,  consider bariatric surgery evaluation outpatient.  Check hemoglobin A1c, LDL  DVT prophylaxis:   Lovenox Code Status:   Code Status: Full Code Family Communication: plan of care discussed with patient at bedside.  Disposition: Currently not medically stable for discharge. Status is: Inpatient Remains inpatient appropriate because: For ongoing management of pneumonia hypoxic respiratory failure and asthma needing IV antibiotics IV steroids supplemental oxygen Objective: Vitals last 24 hrs: Vitals:   08/01/21 2046 08/01/21 2220 08/02/21 0232 08/02/21 0606  BP: (!) 147/89 (!) 146/74 (!) 147/85 (!) 150/95  Pulse: (!) 102 (!) 103 98 99  Resp: (!) 23 20 20 17   Temp: 99.1 F (37.3 C) 98.2 F (36.8 C) 98.4 F (36.9 C) 98.1 F (36.7 C)  TempSrc: Oral Oral Oral Oral  SpO2: 93% 92% 94% 94%  Weight:      Height:       Weight change:   Physical Examination: General exam: AA oriented,older than stated age, weak appearing. HEENT:Oral mucosa moist, Ear/Nose WNL grossly, dentition normal. Respiratory system: bilaterally diminished BS w/ expiratory wheezing, no use of accessory muscle Cardiovascular system: S1 & S2 +, No JVD,. Gastrointestinal system: Abdomen soft,NT,ND, BS+ Nervous System:Alert, awake, moving extremities and grossly nonfocal Extremities: LE edema none,distal peripheral pulses palpable.  Skin: No rashes,no icterus. MSK: Normal muscle bulk,tone, power  Medications reviewed:  Scheduled Meds:  azithromycin  500 mg Oral Daily   enoxaparin (LOVENOX) injection  60 mg Subcutaneous Q24H   methylPREDNISolone (SOLU-MEDROL) injection  40 mg Intravenous Q12H   Continuous Infusions:  sodium chloride Stopped (08/01/21 1930)   cefTRIAXone (ROCEPHIN)  IV        Diet Order             Diet regular Room service appropriate? Yes; Fluid consistency: Thin  Diet effective now  Intake/Output Summary (Last 24 hours) at 08/02/2021 1030 Last data filed at  08/02/2021 0127 Gross per 24 hour  Intake 1406.64 ml  Output --  Net 1406.64 ml   Net IO Since Admission: 1,406.64 mL [08/02/21 1030]  Wt Readings from Last 3 Encounters:  08/01/21 (!) 136.8 kg     Unresulted Labs (From admission, onward)     Start     Ordered   08/08/21 0500  Creatinine, serum  (enoxaparin (LOVENOX)    CrCl >/= 30 ml/min)  Weekly,   R     Comments: while on enoxaparin therapy    08/01/21 2047   08/02/21 0500  Procalcitonin  Daily,   R      08/01/21 2047   08/02/21 0500  Basic metabolic panel  Daily,   R      08/01/21 2047   08/02/21 0500  CBC  Daily,   R      08/01/21 2047   08/01/21 2047  Legionella Pneumophila Serogp 1 Ur Ag  (COPD / Pneumonia / Cellulitis / Lower Extremity Wound)  Once,   R        08/01/21 2047   08/01/21 2047  Strep pneumoniae urinary antigen  (COPD / Pneumonia / Cellulitis / Lower Extremity Wound)  Once,   R        08/01/21 2047   08/01/21 2046  HIV Antibody (routine testing w rflx)  (HIV Antibody (Routine testing w reflex) panel)  Once,   R        08/01/21 2047   Unscheduled  Brain natriuretic peptide  Add-on,   R       Question:  Specimen collection method  Answer:  Lab=Lab collect   08/02/21 1030   Unscheduled  TSH  Add-on,   R       Question:  Specimen collection method  Answer:  Lab=Lab collect   08/02/21 1030   Unscheduled  Lipid panel  Tomorrow morning,   R       Question:  Specimen collection method  Answer:  Lab=Lab collect   08/02/21 1030   Unscheduled  Hemoglobin A1c  Add-on,   R       Question:  Specimen collection method  Answer:  Lab=Lab collect   08/02/21 1030          Data Reviewed: I have personally reviewed following labs and imaging studies CBC: Recent Labs  Lab 08/01/21 1240 08/02/21 0543  WBC 14.0* 12.0*  NEUTROABS 11.2*  --   HGB 13.7 13.7  HCT 41.0 40.5  MCV 91.9 92.9  PLT 139* 165   Basic Metabolic Panel: Recent Labs  Lab 08/01/21 1240 08/02/21 0543  NA 135 134*  K 3.7 4.1  CL 99 105   CO2 24 24  GLUCOSE 115* 153*  BUN 13 11  CREATININE 0.97 0.82  CALCIUM 9.0 8.4*   GFR: Estimated Creatinine Clearance: 185.8 mL/min (by C-G formula based on SCr of 0.82 mg/dL). Liver Function Tests: Recent Labs  Lab 08/01/21 1240  AST 28  ALT 49*  ALKPHOS 56  BILITOT 0.5  PROT 7.5  ALBUMIN 4.2   Recent Labs  Lab 08/01/21 1240  LIPASE 12   No results for input(s): AMMONIA in the last 168 hours. Coagulation Profile: No results for input(s): INR, PROTIME in the last 168 hours. Cardiac Enzymes: No results for input(s): CKTOTAL, CKMB, CKMBINDEX, TROPONINI in the last 168 hours. BNP (last 3 results) No results for input(s): PROBNP in the last 8760 hours. HbA1C:  No results for input(s): HGBA1C in the last 72 hours. CBG: No results for input(s): GLUCAP in the last 168 hours. Lipid Profile: No results for input(s): CHOL, HDL, LDLCALC, TRIG, CHOLHDL, LDLDIRECT in the last 72 hours. Thyroid Function Tests: No results for input(s): TSH, T4TOTAL, FREET4, T3FREE, THYROIDAB in the last 72 hours. Anemia Panel: No results for input(s): VITAMINB12, FOLATE, FERRITIN, TIBC, IRON, RETICCTPCT in the last 72 hours. Sepsis Labs: Recent Labs  Lab 08/01/21 2151 08/02/21 0543  PROCALCITON 0.11 0.10    Recent Results (from the past 240 hour(s))  Expectorated Sputum Assessment w Gram Stain, Rflx to Resp Cult     Status: None   Collection Time: 08/01/21 12:26 AM   Specimen: Sputum  Result Value Ref Range Status   Specimen Description SPUTUM  Final   Special Requests NONE  Final   Sputum evaluation   Final    THIS SPECIMEN IS ACCEPTABLE FOR SPUTUM CULTURE Performed at [facility], [address], [place], [place] [address]    Report Status 08/02/2021 FINAL  Final  Culture, Respiratory w Gram Stain     Status: None (Preliminary result)   Collection Time: 08/01/21 12:26 AM   Specimen: SPU  Result Value Ref Range Status   Specimen Description   Final     SPUTUM Performed at [facility], [address] [place], [place] [address]    Special Requests   Final    NONE Reflexed from [id number] Performed at [facility], [address] [place], [place] [address]    Gram Stain   Final    FEW SQUAMOUS EPITHELIAL CELLS PRESENT MODERATE WBC SEEN FEW GRAM POSITIVE RODS FEW GRAM POSITIVE COCCI Performed at [facility], [address] [place], [place] [address]    Culture PENDING  Incomplete   Report Status PENDING  Incomplete  Resp Panel by RT-PCR (Flu A&B, Covid) Nasopharyngeal Swab     Status: None   Collection Time: 08/01/21 11:25 AM   Specimen: Nasopharyngeal Swab; Nasopharyngeal(NP) swabs in vial transport medium  Result Value Ref Range Status   SARS Coronavirus 2 by RT PCR NEGATIVE NEGATIVE Final    Comment: (NOTE) SARS-CoV-2 target nucleic acids are NOT DETECTED.  The SARS-CoV-2 RNA is generally detectable in upper respiratory specimens during the acute phase of infection. The lowest concentration of SARS-CoV-2 viral copies this assay can detect is 138 copies/mL. A negative result does not preclude SARS-Cov-2 infection and should not be used as the sole basis for treatment or other patient management decisions. A negative result may occur with  improper specimen collection/handling, submission of specimen other than nasopharyngeal swab, presence of viral mutation(s) within the areas targeted by this assay, and inadequate number of viral copies(<138 copies/mL). A negative result must be combined with clinical observations, patient history, and epidemiological information. The expected result is Negative.  Fact Sheet for Patients:  BloggerCourse.com  Fact Sheet for Healthcare Providers:  SeriousBroker.it  This test is no t yet approved or cleared by the [place] FDA and  has been authorized for detection and/or diagnosis of  SARS-CoV-2 by FDA under an Emergency Use Authorization (EUA). This EUA will remain  in effect (meaning this test can be used) for the duration of the COVID-19 declaration under Section 564(b)(1) of the Act, 21 U.S.C.section 360bbb-3(b)(1), unless the authorization is terminated  or revoked sooner.       Influenza A by PCR NEGATIVE NEGATIVE Final   Influenza B by PCR  NEGATIVE NEGATIVE Final    Comment: (NOTE) The Xpert Xpress SARS-CoV-2/FLU/RSV plus assay is intended as an aid in the diagnosis of influenza from Nasopharyngeal swab specimens and should not be used as a sole basis for treatment. Nasal washings and aspirates are unacceptable for Xpert Xpress SARS-CoV-2/FLU/RSV testing.  Fact Sheet for Patients: BloggerCourse.com  Fact Sheet for Healthcare Providers: SeriousBroker.it  This test is not yet approved or cleared by the [place] FDA and has been authorized for detection and/or diagnosis of SARS-CoV-2 by FDA under an Emergency Use Authorization (EUA). This EUA will remain in effect (meaning this test can be used) for the duration of the COVID-19 declaration under Section 564(b)(1) of the Act, 21 U.S.C. section 360bbb-3(b)(1), unless the authorization is terminated or revoked.  Performed at [facility], [address] [place], [place] [address]   Group A Strep by PCR     Status: None   Collection Time: 08/01/21 12:19 PM   Specimen: Throat; Sterile Swab  Result Value Ref Range Status   Group A Strep by PCR NOT DETECTED NOT DETECTED Final    Comment: Performed at [facility], [address] [place], [place] [address]    Antimicrobials: Anti-infectives (From admission, onward)    Start     Dose/Rate Route Frequency Ordered Stop   08/02/21 1800  cefTRIAXone (ROCEPHIN) 2 g in sodium chloride 0.9 % 100 mL IVPB        2 g 200 mL/hr over 30 Minutes Intravenous Every 24 hours  08/01/21 2047 08/06/21 1759   08/02/21 1700  cefTRIAXone (ROCEPHIN) 1 g in sodium chloride 0.9 % 100 mL IVPB  Status:  Discontinued        1 g 200 mL/hr over 30 Minutes Intravenous Every 24 hours 08/01/21 1713 08/01/21 2048   08/02/21 1000  azithromycin (ZITHROMAX) tablet 500 mg        500 mg Oral Daily 08/01/21 2047 08/06/21 0959   08/01/21 1715  cefTRIAXone (ROCEPHIN) 1 g in sodium chloride 0.9 % 100 mL IVPB        1 g 200 mL/hr over 30 Minutes Intravenous  Once 08/01/21 1700 08/01/21 1748   08/01/21 1715  azithromycin (ZITHROMAX) 500 mg in sodium chloride 0.9 % 250 mL IVPB  Status:  Discontinued        500 mg 250 mL/hr over 60 Minutes Intravenous  Once 08/01/21 1700 08/01/21 1713   08/01/21 1715  azithromycin (ZITHROMAX) 500 mg in sodium chloride 0.9 % 250 mL IVPB  Status:  Discontinued        500 mg 250 mL/hr over 60 Minutes Intravenous Every 24 hours 08/01/21 1713 08/01/21 2048      Culture/Microbiology    Component Value Date/Time   SDES SPUTUM 08/01/2021 0026   SDES  08/01/2021 0026    SPUTUM Performed at [facility], [address] [place], [place] [address]    SPECREQUEST NONE 08/01/2021 0026   SPECREQUEST  08/01/2021 0026    NONE Reflexed from [id number] Performed at [facility], [address], [place], [place] [address]    CULT PENDING 08/01/2021 0026   REPTSTATUS 08/02/2021 FINAL 08/01/2021 0026   REPTSTATUS PENDING 08/01/2021 0026  Radiology Studies: CT Angio Chest PE W and/or Wo Contrast  Result Date: 08/01/2021 CLINICAL DATA:  Pulmonary embolism suspected. Positive D-dimer. Cough and congestion. EXAM: CT ANGIOGRAPHY CHEST WITH CONTRAST TECHNIQUE: Multidetector CT imaging of the chest was performed using the standard protocol during bolus administration of intravenous  contrast. Multiplanar CT image reconstructions and MIPs were obtained to evaluate the vascular anatomy. RADIATION DOSE REDUCTION: This exam was performed according  to the departmental dose-optimization program which includes automated exposure control, adjustment of the mA and/or kV according to patient size and/or use of iterative reconstruction technique. CONTRAST:  100mL OMNIPAQUE IOHEXOL 350 MG/ML SOLN COMPARISON:  Chest radiography same day FINDINGS: Cardiovascular: Heart size is normal. No pericardial fluid. No visible coronary artery calcification or aortic atherosclerotic calcification. Pulmonary arterial opacification is moderate. No pulmonary emboli are identified. Mediastinum/Nodes: Mild reactive nodal prominence. Lungs/Pleura: Extensive bronchopneumonia within the right lower lobe with minimal involvement of the right middle lobe. Right upper lobe is clear. Mild patchy infiltrate in the left lower lobe. Left upper lobe is clear. No pleural effusion. Upper Abdomen: Normal Musculoskeletal: Normal Review of the MIP images confirms the above findings. IMPRESSION: No pulmonary emboli. Extensive bronchopneumonia in the right lower lobe. Minimal patchy involvement also of the right middle lobe and left lower lobe. Electronically Signed   By: Paulina FusiMark  Shogry M.D.   On: 08/01/2021 15:50   DG Chest Portable 1 View  Result Date: 08/01/2021 CLINICAL DATA:  Productive cough and fever for 3 days. EXAM: PORTABLE CHEST 1 VIEW COMPARISON:  None. FINDINGS: Normal heart, mediastinum and hila. Clear lungs.  No pleural effusion or pneumothorax. Skeletal structures are grossly intact. IMPRESSION: No active disease. Electronically Signed   By: Amie Portlandavid  Ormond M.D.   On: 08/01/2021 11:57     LOS: 1 day   Lanae Boastamesh Karas Pickerill, MD [facility]  08/02/2021, 10:30 AM

## 2021-08-02 NOTE — Assessment & Plan Note (Deleted)
See #1 °

## 2021-08-02 NOTE — Assessment & Plan Note (Addendum)
Likely due to bronchopneumonia and asthma exacerbation.  Desaturated to 80s on room air on presentation. Maintained appropriate saturation on room air prior to discharge. -See bronchopneumonia and asthma

## 2021-08-02 NOTE — Assessment & Plan Note (Addendum)
Seen pneumonia for antibiotics.  Received Solu-Medrol for 3 days.  Discharged on p.o. prednisone for 2 more days.

## 2021-08-02 NOTE — TOC Initial Note (Signed)
Transition of Care [facility]) - Initial/Assessment Note    Patient Details  Name: [name] MRN: [id number] Date of Birth: [birth year]  [facility] CM/SW Contact:    Lanier Clam, RN Phone Number: 08/02/2021, 3:42 PM  Clinical Narrative: Patient's d/c plan home. !st source assisting w/medicaid. Provided w/[facility] pcp,also [facility] as pcp resource. Noted on 02-monitor.Has own transport home.                  Expected Discharge Plan: Home/Self Care Barriers to Discharge: Continued Medical Work up   Patient Goals and CMS Choice Patient states their goals for this hospitalization and ongoing recovery are:: Home CMS Medicare.gov Compare Post Acute Care list provided to:: Patient Choice offered to / list presented to : Patient  Expected Discharge Plan and Services Expected Discharge Plan: Home/Self Care   Discharge Planning Services: CM Consult                                          Prior Living Arrangements/Services   Lives with:: Spouse Patient language and need for interpreter reviewed:: Yes Do you feel safe going back to the place where you live?: Yes      Need for Family Participation in Patient Care: Yes (Comment) Care giver support system in place?: Yes (comment)   Criminal Activity/Legal Involvement Pertinent to Current Situation/Hospitalization: No - Comment as needed  Activities of Daily Living Home Assistive Devices/Equipment: None ADL Screening (condition at time of admission) Patient's cognitive ability adequate to safely complete daily activities?: Yes Is the patient deaf or have difficulty hearing?: No Does the patient have difficulty seeing, even when wearing glasses/contacts?: No Does the patient have difficulty concentrating, remembering, or making decisions?: No Patient able to express need for assistance with ADLs?: Yes Does the patient have difficulty dressing or bathing?: No Independently performs ADLs?: Yes  (appropriate for developmental age) Does the patient have difficulty walking or climbing stairs?: No Weakness of Legs: None Weakness of Arms/Hands: None  Permission Sought/Granted Permission sought to share information with : Case Manager Permission granted to share information with : Yes, Verbal Permission Granted  Share Information with NAME: Case manager           Emotional Assessment              Admission diagnosis:  Viral syndrome [B34.9] Hypoxia [R09.02] Moderate persistent asthma with exacerbation [J45.41] Acute respiratory failure with hypoxia (HCC) [J96.01] Community acquired pneumonia, unspecified laterality [J18.9] Patient Active Problem List   Diagnosis Date Noted   Obesity, Class III, BMI 40-49.9 (morbid obesity) (HCC) 08/02/2021   Acute respiratory failure with hypoxia (HCC) 08/01/2021   Asthma, chronic, unspecified asthma severity, with acute exacerbation    Pneumonia    ADD (attention deficit disorder) 10/17/2011   PCP:  Oneita Hurt No Pharmacy:   [id number] [address]), [place] - [address] [place] ([place]) [place] [address] Phone: [phone] Fax: [phone]     Social Determinants of Health (SDOH) Interventions    Readmission Risk Interventions No flowsheet data found.

## 2021-08-02 NOTE — Progress Notes (Signed)
Nutrition Brief Note  Patient identified on the Malnutrition Screening Tool (MST) Report  No weight loss noted in weight records. Pt is currently consuming 100% of meals. Appetite had decreased x 2 days PTA d/t acute symptoms. Appetite now improved.  Wt Readings from Last 15 Encounters:  08/01/21 (!) 136.8 kg    Body mass index is 44.54 kg/m. Patient meets criteria for morbid obesity based on current BMI.   Current diet order is regular, patient is consuming approximately 100% of meals at this time. Labs and medications reviewed.   No nutrition interventions warranted at this time. If nutrition issues arise, please consult RD.   Kyle Franco, MS, RD, LDN Inpatient Clinical Dietitian Contact information available via Amion

## 2021-08-02 NOTE — Assessment & Plan Note (Addendum)
Body mass index is 44.54 kg/m. Encourage lifestyle change to lose weight.

## 2021-08-03 LAB — BASIC METABOLIC PANEL
Anion gap: 6 (ref 5–15)
BUN: 17 mg/dL (ref 6–20)
CO2: 25 mmol/L (ref 22–32)
Calcium: 8.4 mg/dL — ABNORMAL LOW (ref 8.9–10.3)
Chloride: 105 mmol/L (ref 98–111)
Creatinine, Ser: 0.81 mg/dL (ref 0.61–1.24)
GFR, Estimated: >60 mL/min (ref >60)
Glucose, Bld: 140 mg/dL — ABNORMAL HIGH (ref 70–99)
Potassium: 4.2 mmol/L (ref 3.5–5.1)
Sodium: 136 mmol/L (ref 135–145)

## 2021-08-03 LAB — LIPID PANEL
Cholesterol: 169 mg/dL (ref 0–200)
HDL: 18 mg/dL — ABNORMAL LOW (ref >40)
LDL Cholesterol: 119 mg/dL — ABNORMAL HIGH (ref 0–99)
Total CHOL/HDL Ratio: 9.4 RATIO
Triglycerides: 158 mg/dL — ABNORMAL HIGH (ref <150)
VLDL: 32 mg/dL (ref 0–40)

## 2021-08-03 LAB — CBC
HCT: 40.4 % (ref 39.0–52.0)
Hemoglobin: 13.4 g/dL (ref 13.0–17.0)
MCH: 30.9 pg (ref 26.0–34.0)
MCHC: 33.2 g/dL (ref 30.0–36.0)
MCV: 93.3 fL (ref 80.0–100.0)
Platelets: 175 10*3/uL (ref 150–400)
RBC: 4.33 MIL/uL (ref 4.22–5.81)
RDW: 12.8 % (ref 11.5–15.5)
WBC: 15.6 10*3/uL — ABNORMAL HIGH (ref 4.0–10.5)
nRBC: 0 % (ref 0.0–0.2)

## 2021-08-03 LAB — LEGIONELLA PNEUMOPHILA SEROGP 1 UR AG: L. pneumophila Serogp 1 Ur Ag: NEGATIVE

## 2021-08-03 LAB — PROCALCITONIN: Procalcitonin: <0.1 ng/mL

## 2021-08-03 NOTE — Progress Notes (Signed)
PROGRESS NOTE [name]  LZJ:[id number] DOB: 03/04/1996 DOA: 08/01/2021 PCP: Pcp, No   Brief Narrative/Hospital Course:  26 y.o. male with medical history significant for asthma and morbid obesity with BMI 45 presented with 3 days of fever, productive cough, wheezing dyspnea and shortness of breath.  No recent asthma flareup and not on inhaler regularly but active smoker which he quit with onset of this episode-was smoking 2 packs/day In the ED-vitals are stable with tachypnea hypoxia.  80s on room air tachycardic to 120s EKG with sinus tachycardia , Labs-WBC 14,000 and platelets 139,000.CTA- no PE-extensive bronchopneumonia in the right lower lobe. Minimal patchy involvement also of the right middle lobe and left lower lobe. troponin negative x2, bnp 27 Pro-Cal 0.1. Patient was given IV fluids antibiotics steroids and admitted. Patient managed with systemic steroids bronchodilators supplemental oxygen and IV antibiotics Respiratory culture- stain- with few gram-positive rods gram-positive cocci    Subjective: Seen and examined this morning.  Patient reports significant improvement Still needing 2 L nasal cannula oxygen Slept well.   Assessment and Plan: * Acute respiratory failure with hypoxia (HCC)- (present on admission) Acute hypoxic respiratory failure Extensive bronchopneumonia mostly in the right lower lobe Mild intermittent asthma with acute exacerbation: Clinically improving.  Still on oxygen.  Respiratory culture Gram stain noted follow-up culture.  Continue with IV ceftriaxone, azithromycin , IV Solu-Medrol along with Protonix, bronchodilator, supplemental oxygen, antitussives. OOB and wean oxygen.  Slight bump in WBC count is likely from steroid.  Pneumonia- (present on admission) See #1  Asthma, chronic, unspecified asthma severity, with acute exacerbation See above  Obesity, Class III, BMI 40-49.9 (morbid obesity) (HCC) BMI 44 will benefit with weight loss healthy  lifestyle PCP follow-up, consider bariatric surgery evaluation outpatient.  Hemoglobin A1c is normal, borderline lipid panel, weight loss should help.  DVT prophylaxis:   Lovenox Code Status:   Code Status: Full Code Family Communication: plan of care discussed with patient at bedside.  Disposition: Currently not medically stable for discharge. Status is: Inpatient Remains inpatient appropriate because: For ongoing management of pneumonia hypoxic respiratory failure and asthma needing IV antibiotics IV steroids supplemental oxygen Objective: Vitals last 24 hrs: Vitals:   08/02/21 0606 08/02/21 1508 08/02/21 2052 08/03/21 0515  BP: (!) 150/95 131/72 (!) 154/89 (!) 149/88  Pulse: 99 96 (!) 105 78  Resp: 17 18 18 14   Temp: 98.1 F (36.7 C) 98.2 F (36.8 C) 98.4 F (36.9 C) 97.6 F (36.4 C)  TempSrc: Oral Oral Oral Oral  SpO2: 94% 93% 94% 97%  Weight:      Height:       Weight change:   Physical Examination: General exam: AA0X3, pleasant, obese HEENT:Oral mucosa moist, Ear/Nose WNL grossly, dentition normal. Respiratory system: bilaterally air entry present better than yesterday with mild expiratory wheezing  Cardiovascular system: S1 & S2 +, No JVD,. Gastrointestinal system: Abdomen soft,NT,ND, BS+ Nervous System:Alert, awake, moving extremities and grossly nonfocal Extremities: edema neg,distal peripheral pulses palpable.  Skin: No rashes,no icterus. MSK: Normal muscle bulk,tone, power  Medications reviewed:  Scheduled Meds:  azithromycin  500 mg Oral Daily   enoxaparin (LOVENOX) injection  60 mg Subcutaneous Q24H   guaiFENesin  600 mg Oral BID   methylPREDNISolone (SOLU-MEDROL) injection  40 mg Intravenous Q12H   pantoprazole  40 mg Oral Daily   Continuous Infusions:  sodium chloride Stopped (08/01/21 1930)   cefTRIAXone (ROCEPHIN)  IV 2 g (08/02/21 1806)      Diet Order  Diet regular Room service appropriate? Yes; Fluid consistency: Thin  Diet  effective now                    Intake/Output Summary (Last 24 hours) at 08/03/2021 1142 Last data filed at 08/03/2021 1011 Gross per 24 hour  Intake 390.49 ml  Output --  Net 390.49 ml   Net IO Since Admission: 1,917.13 mL [08/03/21 1142]  Wt Readings from Last 3 Encounters:  08/01/21 (!) 136.8 kg     Unresulted Labs (From admission, onward)     Start     Ordered   08/08/21 0500  Creatinine, serum  (enoxaparin (LOVENOX)    CrCl >/= 30 ml/min)  Weekly,   R     Comments: while on enoxaparin therapy    08/01/21 2047   08/02/21 0500  Basic metabolic panel  Daily,   R      08/01/21 2047   08/02/21 0500  CBC  Daily,   R      08/01/21 2047   08/01/21 2047  Legionella Pneumophila Serogp 1 Ur Ag  (COPD / Pneumonia / Cellulitis / Lower Extremity Wound)  Once,   R        08/01/21 2047          Data Reviewed: I have personally reviewed following labs and imaging studies CBC: Recent Labs  Lab 08/01/21 1240 08/02/21 0543 08/03/21 0518  WBC 14.0* 12.0* 15.6*  NEUTROABS 11.2*  --   --   HGB 13.7 13.7 13.4  HCT 41.0 40.5 40.4  MCV 91.9 92.9 93.3  PLT 139* 165 175   Basic Metabolic Panel: Recent Labs  Lab 08/01/21 1240 08/02/21 0543 08/03/21 0518  NA 135 134* 136  K 3.7 4.1 4.2  CL 99 105 105  CO2 24 24 25   GLUCOSE 115* 153* 140*  BUN 13 11 17   CREATININE 0.97 0.82 0.81  CALCIUM 9.0 8.4* 8.4*   GFR: Estimated Creatinine Clearance: 188.1 mL/min (by C-G formula based on SCr of 0.81 mg/dL). Liver Function Tests: Recent Labs  Lab 08/01/21 1240  AST 28  ALT 49*  ALKPHOS 56  BILITOT 0.5  PROT 7.5  ALBUMIN 4.2   Recent Labs  Lab 08/01/21 1240  LIPASE 12   No results for input(s): AMMONIA in the last 168 hours. Coagulation Profile: No results for input(s): INR, PROTIME in the last 168 hours. Cardiac Enzymes: No results for input(s): CKTOTAL, CKMB, CKMBINDEX, TROPONINI in the last 168 hours. BNP (last 3 results) No results for input(s): PROBNP in the  last 8760 hours. HbA1C: Recent Labs    08/02/21 1048  HGBA1C 5.3   CBG: No results for input(s): GLUCAP in the last 168 hours. Lipid Profile: Recent Labs    08/03/21 0518  CHOL 169  HDL 18*  LDLCALC 119*  TRIG 158*  CHOLHDL 9.4   Thyroid Function Tests: Recent Labs    08/02/21 1048  TSH 0.498   Anemia Panel: No results for input(s): VITAMINB12, FOLATE, FERRITIN, TIBC, IRON, RETICCTPCT in the last 72 hours. Sepsis Labs: Recent Labs  Lab 08/01/21 2151 08/02/21 0543 08/03/21 0518  PROCALCITON 0.11 0.10 <0.10    Recent Results (from the past 240 hour(s))  Expectorated Sputum Assessment w Gram Stain, Rflx to Resp Cult     Status: None   Collection Time: 08/01/21 12:26 AM   Specimen: Sputum  Result Value Ref Range Status   Specimen Description SPUTUM  Final   Special Requests NONE  Final  Sputum evaluation   Final    THIS SPECIMEN IS ACCEPTABLE FOR SPUTUM CULTURE Performed at [facility], [address], [place], [place] [address]    Report Status 08/02/2021 FINAL  Final  Culture, Respiratory w Gram Stain     Status: None (Preliminary result)   Collection Time: 08/01/21 12:26 AM   Specimen: SPU  Result Value Ref Range Status   Specimen Description   Final    SPUTUM Performed at [facility], [address], [place], [place] [address]    Special Requests   Final    NONE Reflexed from [id number] Performed at [facility], [address], [place], [place] [address]    Gram Stain   Final    FEW SQUAMOUS EPITHELIAL CELLS PRESENT MODERATE WBC SEEN FEW GRAM POSITIVE RODS FEW GRAM POSITIVE COCCI    Culture   Final    CULTURE REINCUBATED FOR BETTER GROWTH Performed at [facility], [address] [place], [place] [address]    Report Status PENDING  Incomplete  Resp Panel by RT-PCR (Flu A&B, Covid) Nasopharyngeal Swab     Status: None   Collection Time: 08/01/21 11:25 AM   Specimen: Nasopharyngeal  Swab; Nasopharyngeal(NP) swabs in vial transport medium  Result Value Ref Range Status   SARS Coronavirus 2 by RT PCR NEGATIVE NEGATIVE Final    Comment: (NOTE) SARS-CoV-2 target nucleic acids are NOT DETECTED.  The SARS-CoV-2 RNA is generally detectable in upper respiratory specimens during the acute phase of infection. The lowest concentration of SARS-CoV-2 viral copies this assay can detect is 138 copies/mL. A negative result does not preclude SARS-Cov-2 infection and should not be used as the sole basis for treatment or other patient management decisions. A negative result may occur with  improper specimen collection/handling, submission of specimen other than nasopharyngeal swab, presence of viral mutation(s) within the areas targeted by this assay, and inadequate number of viral copies(<138 copies/mL). A negative result must be combined with clinical observations, patient history, and epidemiological information. The expected result is Negative.  Fact Sheet for Patients:  BloggerCourse.com  Fact Sheet for Healthcare Providers:  SeriousBroker.it  This test is no t yet approved or cleared by the [place] FDA and  has been authorized for detection and/or diagnosis of SARS-CoV-2 by FDA under an Emergency Use Authorization (EUA). This EUA will remain  in effect (meaning this test can be used) for the duration of the COVID-19 declaration under Section 564(b)(1) of the Act, 21 U.S.C.section 360bbb-3(b)(1), unless the authorization is terminated  or revoked sooner.       Influenza A by PCR NEGATIVE NEGATIVE Final   Influenza B by PCR NEGATIVE NEGATIVE Final    Comment: (NOTE) The Xpert Xpress SARS-CoV-2/FLU/RSV plus assay is intended as an aid in the diagnosis of influenza from Nasopharyngeal swab specimens and should not be used as a sole basis for treatment. Nasal washings and aspirates are unacceptable for Xpert Xpress  SARS-CoV-2/FLU/RSV testing.  Fact Sheet for Patients: BloggerCourse.com  Fact Sheet for Healthcare Providers: SeriousBroker.it  This test is not yet approved or cleared by the [place] FDA and has been authorized for detection and/or diagnosis of SARS-CoV-2 by FDA under an Emergency Use Authorization (EUA). This EUA will remain in effect (meaning this test can be used) for the duration of the COVID-19 declaration under Section 564(b)(1) of the Act, 21 U.S.C. section 360bbb-3(b)(1), unless the authorization is terminated or revoked.  Performed at [facility], [address] [place], [place] [address]   Group A Strep by PCR     Status: None   Collection Time: 08/01/21 12:19 PM   Specimen: Throat; Sterile Swab  Result Value Ref Range Status   Group A Strep by PCR NOT DETECTED NOT DETECTED Final    Comment: Performed at [facility], [address] [place], [place] [address]    Antimicrobials: Anti-infectives (From admission, onward)    Start     Dose/Rate Route Frequency Ordered Stop   08/02/21 1800  cefTRIAXone (ROCEPHIN) 2 g in sodium chloride 0.9 % 100 mL IVPB        2 g 200 mL/hr over 30 Minutes Intravenous Every 24 hours 08/01/21 2047 08/06/21 1759   08/02/21 1700  cefTRIAXone (ROCEPHIN) 1 g in sodium chloride 0.9 % 100 mL IVPB  Status:  Discontinued        1 g 200 mL/hr over 30 Minutes Intravenous Every 24 hours 08/01/21 1713 08/01/21 2048   08/02/21 1000  azithromycin (ZITHROMAX) tablet 500 mg        500 mg Oral Daily 08/01/21 2047 08/06/21 0959   08/01/21 1715  cefTRIAXone (ROCEPHIN) 1 g in sodium chloride 0.9 % 100 mL IVPB        1 g 200 mL/hr over 30 Minutes Intravenous  Once 08/01/21 1700 08/01/21 1748   08/01/21 1715  azithromycin (ZITHROMAX) 500 mg in sodium chloride 0.9 % 250 mL IVPB  Status:  Discontinued        500 mg 250 mL/hr over 60 Minutes Intravenous  Once  08/01/21 1700 08/01/21 1713   08/01/21 1715  azithromycin (ZITHROMAX) 500 mg in sodium chloride 0.9 % 250 mL IVPB  Status:  Discontinued        500 mg 250 mL/hr over 60 Minutes Intravenous Every 24 hours 08/01/21 1713 08/01/21 2048      Culture/Microbiology    Component Value Date/Time   SDES SPUTUM 08/01/2021 0026   SDES  08/01/2021 0026    SPUTUM Performed at [facility], [address] [place], [place] [address]    SPECREQUEST NONE 08/01/2021 0026   SPECREQUEST  08/01/2021 0026    NONE Reflexed from [id number] Performed at [facility], [address] [place], [place] [address]    CULT  08/01/2021 0026    CULTURE REINCUBATED FOR BETTER GROWTH Performed at [facility], [address], [place], [place] [address]    REPTSTATUS 08/02/2021 FINAL 08/01/2021 0026   REPTSTATUS PENDING 08/01/2021 0026  Radiology Studies: CT Angio Chest PE W and/or Wo Contrast  Result Date: 08/01/2021 CLINICAL DATA:  Pulmonary embolism suspected. Positive D-dimer. Cough and congestion. EXAM: CT ANGIOGRAPHY CHEST WITH CONTRAST TECHNIQUE: Multidetector CT imaging of the chest was performed using the standard protocol during bolus administration of intravenous contrast. Multiplanar CT image reconstructions and MIPs were obtained to evaluate the vascular anatomy. RADIATION DOSE REDUCTION: This exam was performed according to the departmental dose-optimization program which includes automated exposure control, adjustment of the mA and/or kV according to patient size and/or use of iterative reconstruction technique. CONTRAST:  OMNIPAQUE IOHEXOL 350 MG/ML SOLN COMPARISON:  Chest radiography same day FINDINGS: Cardiovascular: Heart size is normal. No pericardial fluid. No visible coronary artery calcification or aortic atherosclerotic calcification. Pulmonary arterial opacification is moderate. No pulmonary emboli are identified. Mediastinum/Nodes: Mild reactive nodal  prominence. Lungs/Pleura: Extensive bronchopneumonia within the right lower lobe with minimal involvement of the right middle lobe. Right upper lobe  is clear. Mild patchy infiltrate in the left lower lobe. Left upper lobe is clear. No pleural effusion. Upper Abdomen: Normal Musculoskeletal: Normal Review of the MIP images confirms the above findings. IMPRESSION: No pulmonary emboli. Extensive bronchopneumonia in the right lower lobe. Minimal patchy involvement also of the right middle lobe and left lower lobe. Electronically Signed   By: Paulina FusiMark  Shogry M.D.   On: 08/01/2021 15:50   DG Chest Portable 1 View  Result Date: 08/01/2021 CLINICAL DATA:  Productive cough and fever for 3 days. EXAM: PORTABLE CHEST 1 VIEW COMPARISON:  None. FINDINGS: Normal heart, mediastinum and hila. Clear lungs.  No pleural effusion or pneumothorax. Skeletal structures are grossly intact. IMPRESSION: No active disease. Electronically Signed   By: Amie Portlandavid  Ormond M.D.   On: 08/01/2021 11:57     LOS: 2 days   Lanae Boastamesh Lelia Jons, MD [facility]  08/03/2021, 11:42 AM

## 2021-08-04 DIAGNOSIS — J159 Unspecified bacterial pneumonia: Secondary | ICD-10-CM

## 2021-08-04 DIAGNOSIS — E871 Hypo-osmolality and hyponatremia: Secondary | ICD-10-CM

## 2021-08-04 DIAGNOSIS — A419 Sepsis, unspecified organism: Secondary | ICD-10-CM

## 2021-08-04 DIAGNOSIS — F17200 Nicotine dependence, unspecified, uncomplicated: Secondary | ICD-10-CM

## 2021-08-04 DIAGNOSIS — R6520 Severe sepsis without septic shock: Secondary | ICD-10-CM

## 2021-08-04 HISTORY — DX: Hypo-osmolality and hyponatremia: E87.1

## 2021-08-04 LAB — CBC
HCT: 45.2 % (ref 39.0–52.0)
Hemoglobin: 15.1 g/dL (ref 13.0–17.0)
MCH: 31.3 pg (ref 26.0–34.0)
MCHC: 33.4 g/dL (ref 30.0–36.0)
MCV: 93.6 fL (ref 80.0–100.0)
Platelets: 210 10*3/uL (ref 150–400)
RBC: 4.83 MIL/uL (ref 4.22–5.81)
RDW: 12.9 % (ref 11.5–15.5)
WBC: 17 10*3/uL — ABNORMAL HIGH (ref 4.0–10.5)
nRBC: 0 % (ref 0.0–0.2)

## 2021-08-04 LAB — BASIC METABOLIC PANEL
Anion gap: 8 (ref 5–15)
BUN: 19 mg/dL (ref 6–20)
CO2: 24 mmol/L (ref 22–32)
Calcium: 8.8 mg/dL — ABNORMAL LOW (ref 8.9–10.3)
Chloride: 103 mmol/L (ref 98–111)
Creatinine, Ser: 0.82 mg/dL (ref 0.61–1.24)
GFR, Estimated: >60 mL/min (ref >60)
Glucose, Bld: 125 mg/dL — ABNORMAL HIGH (ref 70–99)
Potassium: 4.1 mmol/L (ref 3.5–5.1)
Sodium: 135 mmol/L (ref 135–145)

## 2021-08-04 LAB — CULTURE, RESPIRATORY W GRAM STAIN: Culture: NORMAL

## 2021-08-04 MED ORDER — DM-GUAIFENESIN ER 30-600 MG PO TB12: 1.0000 | ORAL_TABLET | Freq: Two times a day (BID) | ORAL | 0 refills | Status: AC

## 2021-08-04 MED ORDER — AMOXICILLIN-POT CLAVULANATE 875-125 MG PO TABS: 1.0000 | ORAL_TABLET | Freq: Two times a day (BID) | ORAL | 0 refills | Status: AC

## 2021-08-04 MED ORDER — ALBUTEROL SULFATE HFA 108 (90 BASE) MCG/ACT IN AERS: 2.0000 | INHALATION_SPRAY | Freq: Four times a day (QID) | RESPIRATORY_TRACT | 1 refills | Status: AC | PRN

## 2021-08-04 MED ORDER — PREDNISONE 50 MG PO TABS: 50.0000 mg | ORAL_TABLET | Freq: Every day | ORAL | 0 refills | Status: DC

## 2021-08-04 NOTE — Progress Notes (Signed)
Discharge education completed with pt, all questions answers and medications reviewed. Work excuse note given to pt with discharge paperwork. All belongings returned to pt.  ?

## 2021-08-04 NOTE — Discharge Summary (Signed)
Physician Discharge Summary   Patient: [name] MRN: [id number] DOB: 1995-09-06  Admit date:     08/01/2021  Discharge date: 08/04/21  Discharge Physician: Mercy Riding   PCP: Pcp, No   Recommendations at discharge:   Outpatient follow-up with PCP in 1 to 2 weeks  Discharge Diagnoses: Principal Problem:   Acute respiratory failure with hypoxia (HCC) Active Problems:   Severe sepsis (HCC)   Bronchopneumonia due to bacteria   Asthma, chronic, unspecified asthma severity, with acute exacerbation   Obesity, Class III, BMI 40-49.9 (morbid obesity) ([place])   Tobacco use disorder   Hyponatremia  Resolved Problems:   Pneumonia   Hospital Course:  26 year old male with PMH of mild intermittent asthma, morbid obesity and tobacco use disorder presenting with productive cough, wheezing, dyspnea, and admitted for severe sepsis due to bronchopneumonia and asthma exacerbation with hypoxia in 80s on room air.  He was tachycardic to 120s and had leukocytosis of 14,000.  CTA negative for PE but extensive bronchopneumonia in RLL and minimally patchy involvement in RML and LLL.  Serial troponin and BNP negative.  Pro-Cal negative as well.  Patient was started on CAP coverage with CTX and azithromycin, IV Solu-Medrol and breathing treatment with improvement in his symptoms.  Eventually, he was liberated off oxygen.  He completed 3 days course of Zithromax in the house.  Discharged on p.o. Augmentin and p.o. prednisone for 2 more days.  Was given prescription for albuterol inhaler.  He was counseled on smoking cessation and provided with resources.  Transition of care assisted with PCP set up  See individual problem list below for more on hospital course.  Assessment and Plan: * Acute respiratory failure with hypoxia ([place])- (present on admission) Likely due to bronchopneumonia and asthma exacerbation.  Desaturated to 80s on room air on presentation. Maintained appropriate saturation on room air  prior to discharge. -See bronchopneumonia and asthma  Bronchopneumonia due to bacteria CTA chest concerning for RLL bronchopneumonia with RML and LLL infiltrate. -Received ceftriaxone and azithromycin for 3 days -Discharged on p.o. Augmentin for 2 more days.  Severe sepsis (HCC) Met criteria with tachycardia, leukocytosis, hypoxic respiratory failure and pneumonia.  Sepsis physiology resolved except for leukocytosis which is likely from steroid now.  Hyponatremia Resolved.  Tobacco use disorder Smokes about 2 packs a day. -Encouraged cessation.  He is determined to quit -Gave resources.  Obesity, Class III, BMI 40-49.9 (morbid obesity) (HCC) Body mass index is 44.54 kg/m. Encourage lifestyle change to lose weight.  Asthma, chronic, unspecified asthma severity, with acute exacerbation Seen pneumonia for antibiotics.  Received Solu-Medrol for 3 days.  Discharged on p.o. prednisone for 2 more days.    Procedures performed: None Disposition: Home Diet recommendation:  Discharge Diet Orders (From admission, onward)     Start     Ordered   08/04/21 0000  Diet general        08/04/21 1057           Regular diet  DISCHARGE MEDICATION: Allergies as of 08/04/2021   No Known Allergies      Medication List     STOP taking these medications    ibuprofen 200 MG tablet Commonly known as: ADVIL       TAKE these medications    acetaminophen 500 MG tablet Commonly known as: TYLENOL Take 500 mg by mouth every 6 (six) hours as needed for mild pain.   albuterol 108 (90 Base) MCG/ACT inhaler Commonly known as: VENTOLIN HFA Inhale 2  puffs into the lungs every 6 (six) hours as needed for wheezing or shortness of breath.   amoxicillin-clavulanate 875-125 MG tablet Commonly known as: Augmentin Take 1 tablet by mouth 2 (two) times daily for 2 days. Start taking on: August 05, 2021   dextromethorphan-guaiFENesin 30-600 MG 12hr tablet Commonly known as: MUCINEX DM Take 1  tablet by mouth 2 (two) times daily for 5 days.   NyQuil Severe Cold/Flu 5-6.25-10-325 MG/15ML Liqd Generic drug: Phenyleph-Doxylamine-DM-APAP Take 15 mLs by mouth every 6 (six) hours as needed (cold symptoms).   predniSONE 50 MG tablet Commonly known as: DELTASONE Take 1 tablet (50 mg total) by mouth daily. Start taking on: August 05, 2021        [facility]. Go to .   Specialty: Emergency Medicine Why: As needed, If symptoms worsen Contact information: [place] [address] [phone]        [place]. Schedule an appointment as soon as possible for a visit in 2 days.   Specialty: Family Medicine Why: If you do not have PCP, please see PCP above, Follow up from ER visit Contact information: [place] [address] [phone]                 Discharge Exam: [facility]: Lambert Rhonda W Weights   08/01/21 1122 08/01/21 2041  Weight: 122.5 kg (!) 136.8 kg   Vitals:   08/03/21 1306 08/03/21 2105 08/04/21 0618 08/04/21 1316  BP: (!) 156/81 (!) 144/93 125/71 (!) 142/93  Pulse: 85 89 67 87  Resp: _0 Temp: (!) 97.4 F (36.3 C) 98.6 F (37 C) 98.2 F (36.8 C) 97.7 F (36.5 C)  TempSrc: Oral Oral Oral Oral  SpO2: 95% 95% 98% 92%  Weight:      Height:       GENERAL: No apparent distress.  Nontoxic. HEENT: MMM.  Vision and hearing grossly intact.  NECK: Supple.  No apparent JVD.  RESP:  No IWOB.  Fair aeration bilaterally. CVS:  RRR. Heart sounds normal.  ABD/GI/GU: BS+. Abd soft, NTND.  MSK/EXT:  Moves extremities. No apparent deformity. No edema.  SKIN: no apparent skin lesion or wound NEURO: Awake and alert. Oriented appropriately.  No apparent focal neuro deficit. PSYCH: Calm. Normal affect.   Condition at discharge: good  The results of significant diagnostics from this hospitalization (including imaging,  microbiology, ancillary and laboratory) are listed below for reference.   Imaging Studies: CT Angio Chest PE W and/or Wo Contrast  Result Date: 08/01/2021 CLINICAL DATA:  Pulmonary embolism suspected. Positive D-dimer. Cough and congestion. EXAM: CT ANGIOGRAPHY CHEST WITH CONTRAST TECHNIQUE: Multidetector CT imaging of the chest was performed using the standard protocol during bolus administration of intravenous contrast. Multiplanar CT image reconstructions and MIPs were obtained to evaluate the vascular anatomy. RADIATION DOSE REDUCTION: This exam was performed according to the departmental dose-optimization program which includes automated exposure control, adjustment of the mA and/or kV according to patient size and/or use of iterative reconstruction technique. CONTRAST:  173m OMNIPAQUE IOHEXOL 350 MG/ML SOLN COMPARISON:  Chest radiography same day FINDINGS: Cardiovascular: Heart size is normal. No pericardial fluid. No visible coronary artery calcification or aortic atherosclerotic calcification. Pulmonary arterial opacification is moderate. No pulmonary emboli are identified. Mediastinum/Nodes: Mild reactive nodal prominence. Lungs/Pleura: Extensive bronchopneumonia within the right lower lobe with minimal involvement of the right middle lobe. Right upper lobe is clear.  Mild patchy infiltrate in the left lower lobe. Left upper lobe is clear. No pleural effusion. Upper Abdomen: Normal Musculoskeletal: Normal Review of the MIP images confirms the above findings. IMPRESSION: No pulmonary emboli. Extensive bronchopneumonia in the right lower lobe. Minimal patchy involvement also of the right middle lobe and left lower lobe. Electronically Signed   By: Nelson Chimes M.D.   On: 08/01/2021 15:50   DG Chest Portable 1 View  Result Date: 08/01/2021 CLINICAL DATA:  Productive cough and fever for 3 days. EXAM: PORTABLE CHEST 1 VIEW COMPARISON:  None. FINDINGS: Normal heart, mediastinum and hila. Clear lungs.  No  pleural effusion or pneumothorax. Skeletal structures are grossly intact. IMPRESSION: No active disease. Electronically Signed   By: Lajean Manes M.D.   On: 08/01/2021 11:57    Microbiology: Results for orders placed or performed during the hospital encounter of 08/01/21  Expectorated Sputum Assessment w Gram Stain, Rflx to Resp Cult     Status: None   Collection Time: 08/01/21 12:26 AM   Specimen: Sputum  Result Value Ref Range Status   Specimen Description SPUTUM  Final   Special Requests NONE  Final   Sputum evaluation   Final    THIS SPECIMEN IS ACCEPTABLE FOR SPUTUM CULTURE Performed at [facility], [place] [address], [place], [place] [address]    Report Status 08/02/2021 FINAL  Final  Culture, Respiratory w Gram Stain     Status: None   Collection Time: 08/01/21 12:26 AM   Specimen: SPU  Result Value Ref Range Status   Specimen Description   Final    SPUTUM Performed at [facility] [address] [place], [place] [address]    Special Requests   Final    NONE Reflexed from [phone] Performed at [facility], [place] [address] [place], [place] [address]    Gram Stain   Final    FEW SQUAMOUS EPITHELIAL CELLS PRESENT MODERATE WBC SEEN FEW GRAM POSITIVE RODS FEW GRAM POSITIVE COCCI    Culture   Final    MODERATE Normal respiratory flora-no Staph aureus or Pseudomonas seen Performed at [facility], [address] [place], [place] [address]    Report Status 08/04/2021 FINAL  Final  Resp Panel by RT-PCR (Flu A&B, Covid) Nasopharyngeal Swab     Status: None   Collection Time: 08/01/21 11:25 AM   Specimen: Nasopharyngeal Swab; Nasopharyngeal(NP) swabs in vial transport medium  Result Value Ref Range Status   SARS Coronavirus 2 by RT PCR NEGATIVE NEGATIVE Final    Comment: (NOTE) SARS-CoV-2 target nucleic acids are NOT DETECTED.  The SARS-CoV-2 RNA is generally detectable in upper respiratory specimens during the  acute phase of infection. The lowest concentration of SARS-CoV-2 viral copies this assay can detect is 138 copies/mL. A negative result does not preclude SARS-Cov-2 infection and should not be used as the sole basis for treatment or other patient management decisions. A negative result may occur with  improper specimen collection/handling, submission of specimen other than nasopharyngeal swab, presence of viral mutation(s) within the areas targeted by this assay, and inadequate number of viral copies(<138 copies/mL). A negative result must be combined with clinical observations, patient history, and epidemiological information. The expected result is Negative.  Fact Sheet for Patients:  EntrepreneurPulse.com.au  Fact Sheet for Healthcare Providers:  IncredibleEmployment.be  This test is no t yet approved or cleared by the [place] FDA and  has been authorized for detection and/or diagnosis of  SARS-CoV-2 by FDA under an Emergency Use Authorization (EUA). This EUA will remain  in effect (meaning this test can be used) for the duration of the COVID-19 declaration under Section 564(b)(1) of the Act, 21 U.S.C.section 360bbb-3(b)(1), unless the authorization is terminated  or revoked sooner.       Influenza A by PCR NEGATIVE NEGATIVE Final   Influenza B by PCR NEGATIVE NEGATIVE Final    Comment: (NOTE) The Xpert Xpress SARS-CoV-2/FLU/RSV plus assay is intended as an aid in the diagnosis of influenza from Nasopharyngeal swab specimens and should not be used as a sole basis for treatment. Nasal washings and aspirates are unacceptable for Xpert Xpress SARS-CoV-2/FLU/RSV testing.  Fact Sheet for Patients: EntrepreneurPulse.com.au  Fact Sheet for Healthcare Providers: IncredibleEmployment.be  This test is not yet approved or cleared by the [place] FDA and has been authorized for detection and/or  diagnosis of SARS-CoV-2 by FDA under an Emergency Use Authorization (EUA). This EUA will remain in effect (meaning this test can be used) for the duration of the COVID-19 declaration under Section 564(b)(1) of the Act, 21 U.S.C. section 360bbb-3(b)(1), unless the authorization is terminated or revoked.  Performed at [facility], [address] [place], [place] [address]   Group A Strep by PCR     Status: None   Collection Time: 08/01/21 12:19 PM   Specimen: Throat; Sterile Swab  Result Value Ref Range Status   Group A Strep by PCR NOT DETECTED NOT DETECTED Final    Comment: Performed at [facility], [address], [place], [place] [address]    Labs: CBC: Recent Labs  Lab 08/01/21 1240 08/02/21 0543 08/03/21 0518 08/04/21 0547  WBC 14.0* 12.0* 15.6* 17.0*  NEUTROABS 11.2*  --   --   --   HGB 13.7 13.7 13.4 15.1  HCT 41.0 40.5 40.4 45.2  MCV 91.9 92.9 93.3 93.6  PLT 139* 165 175 725   Basic Metabolic Panel: Recent Labs  Lab 08/01/21 1240 08/02/21 0543 08/03/21 0518 08/04/21 0547  NA 135 134* 136 135  K 3.7 4.1 4.2 4.1  CL 99 105 105 103  CO2 _0 GLUCOSE 115* 153* 140* 125*  BUN _1 CREATININE 0.97 0.82 0.81 0.82  CALCIUM 9.0 8.4* 8.4* 8.8*   Liver Function Tests: Recent Labs  Lab 08/01/21 1240  AST 28  ALT 49*  ALKPHOS 56  BILITOT 0.5  PROT 7.5  ALBUMIN 4.2   CBG: No results for input(s): GLUCAP in the last 168 hours.  Discharge time spent: greater than 30 minutes.  Signed: Mercy Riding, MD [facility] 08/04/2021

## 2021-08-04 NOTE — Progress Notes (Signed)
SATURATION QUALIFICATIONS: (This note is used to comply with regulatory documentation for home oxygen) ? ?Patient Saturations on Room Air at Rest = 94% ? ?Patient Saturations on Room Air while Ambulating = 92% ? ?Patient Saturations on 0 Liters of oxygen while Ambulating = 92% ? ?Please briefly explain why patient needs home oxygen: ? ?Pt does not qualify for home oxygen at this time.  ?

## 2021-08-04 NOTE — Assessment & Plan Note (Signed)
CTA chest concerning for RLL bronchopneumonia with RML and LLL infiltrate. ?-Received ceftriaxone and azithromycin for 3 days ?-Discharged on p.o. Augmentin for 2 more days. ?

## 2021-08-04 NOTE — Assessment & Plan Note (Signed)
Smokes about 2 packs a day. ?-Encouraged cessation.  He is determined to quit ?-Gave resources. ?

## 2021-08-04 NOTE — Assessment & Plan Note (Signed)
Met criteria with tachycardia, leukocytosis, hypoxic respiratory failure and pneumonia.  Sepsis physiology resolved except for leukocytosis which is likely from steroid now. ?

## 2021-08-04 NOTE — Assessment & Plan Note (Signed)
Resolved

## 2021-09-10 ENCOUNTER — Encounter (HOSPITAL_BASED_OUTPATIENT_CLINIC_OR_DEPARTMENT_OTHER): Payer: Self-pay | Admitting: Urology

## 2021-09-10 ENCOUNTER — Other Ambulatory Visit: Payer: Self-pay

## 2021-09-10 ENCOUNTER — Emergency Department (HOSPITAL_BASED_OUTPATIENT_CLINIC_OR_DEPARTMENT_OTHER): Payer: Self-pay

## 2021-09-10 ENCOUNTER — Emergency Department (HOSPITAL_BASED_OUTPATIENT_CLINIC_OR_DEPARTMENT_OTHER)
Admission: EM | Admit: 2021-09-10 | Discharge: 2021-09-11 | Disposition: A | Discharge status: Home / Self Care | Payer: Self-pay | Attending: Emergency Medicine | Admitting: Emergency Medicine

## 2021-09-10 DIAGNOSIS — B349 Viral infection, unspecified: Secondary | ICD-10-CM

## 2021-09-10 DIAGNOSIS — J45901 Unspecified asthma with (acute) exacerbation: Secondary | ICD-10-CM | POA: Insufficient documentation

## 2021-09-10 DIAGNOSIS — R0602 Shortness of breath: Secondary | ICD-10-CM | POA: Insufficient documentation

## 2021-09-10 DIAGNOSIS — Z20822 Contact with and (suspected) exposure to covid-19: Secondary | ICD-10-CM | POA: Insufficient documentation

## 2021-09-10 LAB — RESP PANEL BY RT-PCR (FLU A&B, COVID) ARPGX2
Influenza A by PCR: NEGATIVE
Influenza B by PCR: NEGATIVE
SARS Coronavirus 2 by RT PCR: NEGATIVE

## 2021-09-10 LAB — GROUP A STREP BY PCR: Group A Strep by PCR: NOT DETECTED

## 2021-09-10 MED ORDER — ALBUTEROL SULFATE HFA 108 (90 BASE) MCG/ACT IN AERS
2.0000 | INHALATION_SPRAY | Freq: Once | RESPIRATORY_TRACT | Status: AC
  Administered 2021-09-10: 2 via RESPIRATORY_TRACT
  Filled 2021-09-10: qty 6.7

## 2021-09-10 NOTE — ED Triage Notes (Signed)
Pt states sore throat that started today, daughter has strep at home ?States diarrhea that started this am  ?State SOB but had pneumonia 3 weeks ago  ?

## 2021-09-10 NOTE — ED Notes (Signed)
CT after labs, please ?

## 2021-09-11 ENCOUNTER — Emergency Department (HOSPITAL_BASED_OUTPATIENT_CLINIC_OR_DEPARTMENT_OTHER): Payer: Self-pay

## 2021-09-11 MED ORDER — IBUPROFEN 800 MG PO TABS
800.0000 mg | ORAL_TABLET | Freq: Once | ORAL | Status: AC
  Administered 2021-09-11: 800 mg via ORAL
  Filled 2021-09-11: qty 1

## 2021-09-11 NOTE — ED Notes (Signed)
Patient transported to CT 

## 2021-09-11 NOTE — ED Provider Notes (Signed)
Care of the patient assumed at the change of shift. Here for URI symptoms. Has had neg CXR with occult PNA on CT in the past and feels similar. CT is pending.  ?Physical Exam  ?BP 123/81 (BP Location: Left Arm)   Pulse (!) 128   Temp (!) 100.9 ?F (38.3 ?C) (Oral)   Resp 20   Ht 5\' 9"  (1.753 m)   Wt (!) 136.8 kg   SpO2 96%   BMI 44.54 kg/m?  ? ?Physical Exam ?Awake alert ?No distress ?Resp even and unlabored ?Procedures  ?Procedures ? ?ED Course / MDM  ? ?Clinical Course as of 09/11/21 0035  ?Sat Sep 11, 2021  ?Sep 13, 2021 I personally viewed the images from radiology studies and agree with radiologist interpretation: CT is neg for acute process ? [CS]  ?8921 Discussed results with patient. Recommend supportive OTC care at home. RTED for any other concerns.  [CS]  ?  ?Clinical Course User Index ?[CS] 1941, MD  ? ?Medical Decision Making ?Problems Addressed: ?Viral syndrome: acute illness or injury ? ?Amount and/or Complexity of Data Reviewed ?Radiology: ordered and independent interpretation performed. Decision-making details documented in ED Course. ? ?Risk ?OTC drugs. ?Prescription drug management. ? ? ? ? ? ? ? ?  ?Pollyann Savoy, MD ?09/11/21 [phone] ? ?

## 2021-09-11 NOTE — ED Notes (Signed)
Patient returned to room from CT. 

## 2021-09-27 NOTE — ED Provider Notes (Signed)
?[facility] EMERGENCY DEPARTMENT ?Provider Note ? ? ?CSN: [id number] ?Arrival date & time: 09/10/21  2119 ? ?  ? ?History ? ?Chief Complaint  ?Patient presents with  ? Sore Throat  ? ? ?Luigi Stuckey is a 26 y.o. male who presents to the ED for evaluation of sore throat and URI symptoms.  He states that his daughter has strep throat at home and is concerned that he has also developed strep throat.  Additionally he feels short of breath and is concerned that he also had pneumonia 3 weeks ago.  He was treated with antibiotics outpatient, however he notes that his pneumonia was not detectable on x-ray but rather required CT.  He also endorses fever, chills, diarrhea. ? ? ?Sore Throat ? ? ?  ? ?Home Medications ?Prior to Admission medications   ?Medication Sig Start Date End Date Taking? Authorizing Provider  ?acetaminophen (TYLENOL) 500 MG tablet Take 500 mg by mouth every 6 (six) hours as needed for mild pain.    [provider]  ?albuterol (VENTOLIN HFA) 108 (90 Base) MCG/ACT inhaler Inhale 2 puffs into the lungs every 6 (six) hours as needed for wheezing or shortness of breath. 08/04/21   Almon Hercules, MD  ?Phenyleph-Doxylamine-DM-APAP (NYQUIL SEVERE COLD/FLU) 5-6.25-10-325 MG/15ML LIQD Take 15 mLs by mouth every 6 (six) hours as needed (cold symptoms).    [provider]  ?predniSONE (DELTASONE) 50 MG tablet Take 1 tablet (50 mg total) by mouth daily. 08/05/21   Almon Hercules, MD  ?   ? ?Allergies    ?Patient has no known allergies.   ? ?Review of Systems   ?Review of Systems ? ?Physical Exam ?Updated Vital Signs ?BP 130/80 (BP Location: Right Arm)   Pulse (!) 112   Temp 100.2 ?F (37.9 ?C) (Oral)   Resp (!) 22   Ht 5\' 9"  (1.753 m)   Wt (!) 136.8 kg   SpO2 97%   BMI 44.54 kg/m?  ?Physical Exam ?Vitals and nursing note reviewed.  ?Constitutional:   ?   General: He is not in acute distress. ?   Appearance: He is ill-appearing.  ?HENT:  ?   Head: Atraumatic.  ?Eyes:  ?    Conjunctiva/sclera: Conjunctivae normal.  ?Cardiovascular:  ?   Rate and Rhythm: Normal rate and regular rhythm.  ?   Pulses: Normal pulses.  ?   Heart sounds: No murmur heard. ?Pulmonary:  ?   Effort: Pulmonary effort is normal. No respiratory distress.  ?   Breath sounds: Normal breath sounds.  ?Abdominal:  ?   General: Abdomen is flat. There is no distension.  ?   Palpations: Abdomen is soft.  ?   Tenderness: There is no abdominal tenderness.  ?Musculoskeletal:     ?   General: Normal range of motion.  ?   Cervical back: Normal range of motion.  ?Skin: ?   General: Skin is warm and dry.  ?   Capillary Refill: Capillary refill takes less than 2 seconds.  ?Neurological:  ?   General: No focal deficit present.  ?   Mental Status: He is alert.  ?Psychiatric:     ?   Mood and Affect: Mood normal.  ? ? ?ED Results / Procedures / Treatments   ?Labs ?(all labs ordered are listed, but only abnormal results are displayed) ?Labs Reviewed  ?RESP PANEL BY RT-PCR (FLU A&B, COVID) [id number]  ?GROUP A STREP BY PCR  ? ? ?EKG ?None ? ?Radiology ?No results found. ? ?  Procedures ?Procedures  ? ? ?Medications Ordered in ED ?Medications  ?albuterol (VENTOLIN HFA) 108 (90 Base) MCG/ACT inhaler 2 puff (2 puffs Inhalation Given 09/10/21 2302)  ?ibuprofen (ADVIL) tablet 800 mg (800 mg Oral Given 09/11/21 0050)  ? ? ?ED Course/ Medical Decision Making/ A&P ?Clinical Course as of 09/27/21 1528  ?Sat Sep 11, 2021  ?5974 I personally viewed the images from radiology studies and agree with radiologist interpretation: CT is neg for acute process ? [CS]  ?1638 Discussed results with patient. Recommend supportive OTC care at home. RTED for any other concerns.  [CS]  ?  ?Clinical Course User Index ?[CS] Pollyann Savoy, MD  ? ?                        ?Medical Decision Making ?Amount and/or Complexity of Data Reviewed ?Radiology: ordered. ? ?Risk ?Prescription drug management. ? ? ?This is 27 year old male in no acute distress, nontoxic-appearing  presenting to the ED for URI symptoms, strep throat and concern for pneumonia.  On exam, patient is tachycardic and febrile to 100.9 ?F, however he is walking about the room with oxygen at 92% and room air.  Pulmonary exam without wheezes rales or rhonchi.  I ordered and interpreted upper respiratory panel and strep test which were negative.  I also ordered and viewed chest x-ray which was negative for pneumonia or infection.  Given that patient has recent history of pneumonia and expresses concern that the chest x-ray missed it, we will proceed with CT of the chest out of abundance of caution.  Care handed off to [facility] MD at shift change she will await CT results and determine treatment and disposition. Likely discharge with outpatient management. ? ?Final Clinical Impression(s) / ED Diagnoses ?Final diagnoses:  ?Viral syndrome  ? ? ?Rx / DC Orders ?ED Discharge Orders   ? ? None  ? ?  ? ? ?  ?Janell Quiet, [place] ?09/27/21 1534 ? ?  ?Milagros Loll, MD ?09/28/21 1216 ? ?

## 2022-01-28 ENCOUNTER — Encounter (HOSPITAL_BASED_OUTPATIENT_CLINIC_OR_DEPARTMENT_OTHER): Payer: 59 | Attending: General Surgery | Admitting: General Surgery

## 2022-01-28 DIAGNOSIS — Z87891 Personal history of nicotine dependence: Secondary | ICD-10-CM | POA: Diagnosis not present

## 2022-01-28 DIAGNOSIS — L97822 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sed: Secondary | ICD-10-CM | POA: Insufficient documentation

## 2022-01-28 DIAGNOSIS — M726 Necrotizing fasciitis: Secondary | ICD-10-CM | POA: Insufficient documentation

## 2022-01-28 NOTE — Progress Notes (Signed)
TARREN, VELARDI ([id number]) Visit Report for 01/28/2022 Problem List Details Patient Name: Date of Service: [name], [place] BERT 01/28/2022 8:00 A M Medical Record Number: [id number] Patient Account Number: [id number] Date of Birth/Sex: Treating RN: 12-09-1995 (26 y.o. Dianna Limbo Primary Care Provider: Oralia Manis Other Clinician: Referring Provider: Treating Provider/Extender: Levi Aland in Treatment: 0 Active Problems ICD-10 Encounter Code Description Active Date MDM Diagnosis E66.01 Morbid (severe) obesity due to excess calories 01/28/2022 No Yes Inactive Problems Resolved Problems Electronic Signature(s) Signed: 01/28/2022 8:33:29 AM By: Duanne Guess MD FACS Entered By: Duanne Guess on 01/28/2022 08:33:29

## 2022-01-28 NOTE — Progress Notes (Signed)
[name], [name] ([id number]) Visit Report for 01/28/2022 Abuse Risk Screen Details Patient Name: Date of Service: [name], [name] 01/28/2022 8:00 Kyle M Medical Record Number: [id number] Patient Account Number: [id number] Date of Birth/Sex: Treating RN: September 15, 1995 (26 y.o. [name] Primary Care [name]: [name] Other Clinician: Referring [name]: Treating [name]/Extender: [name] in Treatment: 0 Abuse Risk Screen Items Answer ABUSE RISK SCREEN: Has anyone close to you tried to hurt or harm you recentlyo No Do you feel uncomfortable with anyone in your familyo No Has anyone forced you do things that you didnt want to doo No Electronic Signature(s) Signed: 01/28/2022 5:37:53 PM By: Kyle Schwalbe RN Entered By: [name] on 01/28/2022 08:42:50 -------------------------------------------------------------------------------- Activities of Daily Living Details Patient Name: Date of Service: [name] 01/28/2022 8:00 Kyle M Medical Record Number: [id number] Patient Account Number: [id number] Date of Birth/Sex: Treating RN: 1995-09-10 (26 y.o. [name] Primary Care [name]: [name] Other Clinician: Referring Sohan Potvin: Treating [name]/Extender: [name] in Treatment: 0 Activities of Daily Living Items Answer Activities of Daily Living (Please select one for each item) Drive Automobile Need Assistance T Medications ake Completely Able Use T elephone Completely Able Care for Appearance Completely Able Use T oilet Completely Able Bath / Shower Completely Able Dress Self Completely Able Feed Self Completely Able Walk Completely Able Get In / Out Bed Completely Able Housework Need Assistance Prepare Meals Completely Able Handle Money Completely Able Shop for Self Completely Able Electronic Signature(s) Signed: 01/28/2022 5:37:53 PM By: Kyle Schwalbe RN Entered  By: [name] on 01/28/2022 08:44:03 -------------------------------------------------------------------------------- Education Screening Details Patient Name: Date of Service: [name], [name] 01/28/2022 8:00 Kyle M Medical Record Number: [id number] Patient Account Number: [id number] Date of Birth/Sex: Treating RN: 29-Nov-1995 (26 y.o. [name] Primary Care [name]: [name] Other Clinician: Referring Kashlynn Kundert: Treating [name]/Extender: [name] in Treatment: 0 Primary Learner Assessed: Patient Learning Preferences/Education Level/Primary Language Learning Preference: Explanation, Demonstration, Printed Material Preferred Language: English Cognitive Barrier Language Barrier: No Translator Needed: No Memory Deficit: No Emotional Barrier: No Cultural/Religious Beliefs Affecting Medical Care: No Physical Barrier Impaired Vision: No Impaired Hearing: No Decreased Hand dexterity: No Knowledge/Comprehension Knowledge Level: High Comprehension Level: High Ability to understand written instructions: High Ability to understand verbal instructions: High Motivation Anxiety Level: Calm Cooperation: Cooperative Education Importance: Acknowledges Need Interest in Health Problems: Asks Questions Perception: Coherent Willingness to Engage in Self-Management High Activities: Readiness to Engage in Self-Management High Activities: Electronic Signature(s) Signed: 01/28/2022 5:37:53 PM By: Kyle Schwalbe RN Entered By: [name] on 01/28/2022 08:44:58 -------------------------------------------------------------------------------- Fall Risk Assessment Details Patient Name: Date of Service: [name], [name] 01/28/2022 8:00 Kyle M Medical Record Number: [id number] Patient Account Number: [id number] Date of Birth/Sex: Treating RN: 1995-10-12 (26 y.o. [name] Primary Care [name]: [name] Other  Clinician: Referring [name]: Treating [name]/Extender: [name] in Treatment: 0 Fall Risk Assessment Items Have you had 2 or more falls in the last 12 monthso 0 No Have you had any fall that resulted in injury in the last 12 monthso 0 No FALLS RISK SCREEN History of falling - immediate or within 3 months 0 No Secondary diagnosis (Do you have 2 or more medical diagnoseso) 0 No Ambulatory aid None/bed rest/wheelchair/nurse 0 No Crutches/cane/walker 0 No Furniture 0 No Intravenous therapy Access/Saline/Heparin Lock 0 No Gait/Transferring Normal/ bed rest/ wheelchair 0 No Weak (short steps with or without shuffle, stooped but  able to lift head while walking, may seek 0 No support from furniture) Impaired (short steps with shuffle, may have difficulty arising from chair, head down, impaired 0 No balance) Mental Status Oriented to own ability 0 No Electronic Signature(s) Signed: 01/28/2022 5:37:53 PM By: Kyle Schwalbe RN Entered By: [name] on 01/28/2022 08:45:05 -------------------------------------------------------------------------------- Foot Assessment Details Patient Name: Date of Service: [name], [name] 01/28/2022 8:00 Kyle M Medical Record Number: [id number] Patient Account Number: [id number] Date of Birth/Sex: Treating RN: 07/17/1995 (26 y.o. [name] Primary Care [name]: [name] Other Clinician: Referring [name]: Treating [name]/Extender: [name] in Treatment: 0 Foot Assessment Items Site Locations + = Sensation present, - = Sensation absent, C = Callus, U = Ulcer R = Redness, W = Warmth, M = Maceration, PU = Pre-ulcerative lesion F = Fissure, S = Swelling, D = Dryness Assessment Right: Left: Other Deformity: No No Prior Foot Ulcer: No No Prior Amputation: No No Charcot Joint: No No Ambulatory Status: Ambulatory With Help Assistance Device: Cane Gait:  Steady Electronic Signature(s) Signed: 01/28/2022 5:37:53 PM By: Kyle Schwalbe RN Entered By: [name] on 01/28/2022 08:50:09 -------------------------------------------------------------------------------- Nutrition Risk Screening Details Patient Name: Date of Service: [name], [name] 01/28/2022 8:00 Kyle M Medical Record Number: [id number] Patient Account Number: [id number] Date of Birth/Sex: Treating RN: 1995-09-02 (26 y.o. [name] Primary Care Aarush Stukey: [name] Other Clinician: Referring Kerim Statzer: Treating Kalii Chesmore/Extender: [name] in Treatment: 0 Height (in): Weight (lbs): Body Mass Index (BMI): Nutrition Risk Screening Items Score Screening NUTRITION RISK SCREEN: I have an illness or condition that made me change the kind and/or amount of food I eat 0 No I eat fewer than two meals per day 0 No I eat few fruits and vegetables, or milk products 0 No I have three or more drinks of beer, liquor or wine almost every day 0 No I have tooth or mouth problems that make it hard for me to eat 0 No I don't always have enough money to buy the food I need 0 No I eat alone most of the time 0 No I take three or more different prescribed or over-the-counter drugs Kyle day 0 No Without wanting to, I have lost or gained 10 pounds in the last six months 0 No I am not always physically able to shop, cook and/or feed myself 0 No Nutrition Protocols Good Risk Protocol 0 No interventions needed Moderate Risk Protocol High Risk Proctocol Risk Level: Good Risk Score: 0 Electronic Signature(s) Signed: 01/28/2022 5:37:53 PM By: Kyle Schwalbe RN Entered By: [name] on 01/28/2022 08:46:26

## 2022-01-28 NOTE — Progress Notes (Signed)
[name] ([id number]) Visit Report for 01/28/2022 Allergy List Details Patient Name: Date of Service: [name] 01/28/2022 8:00 A M Medical Record Number: [id number] Patient Account Number: [id number] Date of Birth/Sex: Treating RN: [birth year] (26 y.o. Dianna Limbo Primary Care Ying Blankenhorn: Oralia Manis Other Clinician: Referring Brytni Dray: Treating Zoie Sarin/Extender: Levi Aland in Treatment: 0 Allergies Active Allergies No Known Allergies Allergy Notes Electronic Signature(s) Signed: 01/28/2022 5:37:53 PM By: Karie Schwalbe RN Entered By: Karie Schwalbe on 01/28/2022 08:35:05 -------------------------------------------------------------------------------- Arrival Information Details Patient Name: Date of Service: [name] 01/28/2022 8:00 A M Medical Record Number: [id number] Patient Account Number: [id number] Date of Birth/Sex: Treating RN: 05-Aug-1995 (26 y.o. Dianna Limbo Primary Care Novalee Horsfall: Oralia Manis Other Clinician: Referring Abdulahad Mederos: Treating Charlotte Brafford/Extender: Levi Aland in Treatment: 0 Visit Information Patient Arrived: Ambulatory Arrival Time: 08:33 Accompanied By: friend Transfer Assistance: None Patient Identification Verified: Yes Electronic Signature(s) Signed: 01/28/2022 5:37:53 PM By: Karie Schwalbe RN Entered By: Karie Schwalbe on 01/28/2022 08:33:24 -------------------------------------------------------------------------------- Clinic Level of Care Assessment Details Patient Name: Date of Service: [name] 01/28/2022 8:00 A M Medical Record Number: [id number] Patient Account Number: [id number] Date of Birth/Sex: Treating RN: Nov 28, 1995 (26 y.o. Dianna Limbo Primary Care Kysean Sweet: Oralia Manis Other Clinician: Referring Luisfernando Brightwell: Treating Mitchael Luckey/Extender: Levi Aland in Treatment: 0 Clinic Level of  Care Assessment Items TOOL 1 Quantity Score X- 1 0 Use when EandM and Procedure is performed on INITIAL visit ASSESSMENTS - Nursing Assessment / Reassessment X- 1 20 General Physical Exam (combine w/ comprehensive assessment (listed just below) when performed on new pt. evals) X- 1 25 Comprehensive Assessment (HX, ROS, Risk Assessments, Wounds Hx, etc.) ASSESSMENTS - Wound and Skin Assessment / Reassessment []  - 0 Dermatologic / Skin Assessment (not related to wound area) ASSESSMENTS - Ostomy and/or Continence Assessment and Care []  - 0 Incontinence Assessment and Management []  - 0 Ostomy Care Assessment and Management (repouching, etc.) PROCESS - Coordination of Care X - Simple Patient / Family Education for ongoing care 1 15 []  - 0 Complex (extensive) Patient / Family Education for ongoing care X- 1 10 Staff obtains , Records, T Results / Process Orders est X- 1 10 Staff telephones HHA, Nursing Homes / Clarify orders / etc []  - 0 Routine Transfer to another Facility (non-emergent condition) []  - 0 Routine Hospital Admission (non-emergent condition) X- 1 15 New Admissions / / Ordering NPWT Apligraf, etc. , []  - 0 Emergency Hospital Admission (emergent condition) PROCESS - Special Needs []  - 0 Pediatric / Minor Patient Management []  - 0 Isolation Patient Management []  - 0 Hearing / Language / Visual special needs []  - 0 Assessment of Community assistance (transportation, D/C planning, etc.) []  - 0 Additional assistance / Altered mentation []  - 0 Support Surface(s) Assessment (bed, cushion, seat, etc.) INTERVENTIONS - Miscellaneous []  - 0 External ear exam []  - 0 Patient Transfer (multiple staff / / Similar devices) []  - 0 Simple Staple / Suture removal (25 or less) []  - 0 Complex Staple / Suture removal (26 or more) []  - 0 Hypo/Hyperglycemic Management (do not check if billed separately) []  - 0 Ankle / Brachial  Index (ABI) - do not check if billed separately Has the patient been seen at the hospital within the last three years: Yes Total Score: 95 Level Of Care: New/Established - Level 3 Electronic Signature(s) Signed: 01/28/2022 5:37:53 PM By: Chiropractor RN Entered By: ,  Randa Evens on 01/28/2022 17:28:15 -------------------------------------------------------------------------------- Compression Therapy Details Patient Name: Date of Service: [name] 01/28/2022 8:00 A M Medical Record Number: [id number] Patient Account Number: [id number] Date of Birth/Sex: Treating RN: 02-Dec-1995 (26 y.o. Dianna Limbo Primary Care Danyella Mcginty: Oralia Manis Other Clinician: Referring Fabrizzio Marcella: Treating Ravin Denardo/Extender: Levi Aland in Treatment: 0 Compression Therapy Performed for Wound Assessment: Wound #1 Left,Anterior Lower Leg Performed By: Clinician Karie Schwalbe, RN Compression Type: Three Layer Post Procedure Diagnosis Same as Pre-procedure Electronic Signature(s) Signed: 01/28/2022 5:37:53 PM By: Karie Schwalbe RN Entered By: Karie Schwalbe on 01/28/2022 17:29:00 -------------------------------------------------------------------------------- Compression Therapy Details Patient Name: Date of Service: [name] 01/28/2022 8:00 A M Medical Record Number: [id number] Patient Account Number: [id number] Date of Birth/Sex: Treating RN: 1995/09/02 (26 y.o. Dianna Limbo Primary Care Janus Vlcek: Oralia Manis Other Clinician: Referring Steph Cheadle: Treating Jessilynn Taft/Extender: Levi Aland in Treatment: 0 Compression Therapy Performed for Wound Assessment: Wound #3 Left,Lateral Lower Leg Performed By: Clinician Karie Schwalbe, RN Compression Type: Three Layer Post Procedure Diagnosis Same as Pre-procedure Electronic Signature(s) Signed: 01/28/2022 5:37:53 PM By: Karie Schwalbe RN Entered By: Karie Schwalbe on 01/28/2022 17:29:00 -------------------------------------------------------------------------------- Compression Therapy Details Patient Name: Date of Service: [name] 01/28/2022 8:00 A M Medical Record Number: [id number] Patient Account Number: [id number] Date of Birth/Sex: Treating RN: [birth year] (26 y.o. Dianna Limbo Primary Care Atreus Hasz: Oralia Manis Other Clinician: Referring Analeigh Aries: Treating Clarence Dunsmore/Extender: Levi Aland in Treatment: 0 Compression Therapy Performed for Wound Assessment: Wound #2 Left,Lateral Knee Performed By: Clinician Karie Schwalbe, RN Compression Type: Three Layer Post Procedure Diagnosis Same as Pre-procedure Electronic Signature(s) Signed: 01/28/2022 5:37:53 PM By: Karie Schwalbe RN Entered By: Karie Schwalbe on 01/28/2022 17:29:00 -------------------------------------------------------------------------------- Encounter Discharge Information Details Patient Name: Date of Service: [name] 01/28/2022 8:00 A M Medical Record Number: [id number] Patient Account Number: [id number] Date of Birth/Sex: Treating RN: 01/25/1996 (26 y.o. Dianna Limbo Primary Care Nubia Ziesmer: Oralia Manis Other Clinician: Referring Akili Cuda: Treating Jaida Basurto/Extender: Levi Aland in Treatment: 0 Encounter Discharge Information Items Post Procedure Vitals Discharge Condition: Stable Temperature (F): 98.7 Ambulatory Status: Cane Pulse (bpm): 91 Discharge Destination: Home Respiratory Rate (breaths/min): 16 Transportation: Private Auto Blood Pressure (mmHg): 134/85 Accompanied By: sig other Schedule Follow-up Appointment: Yes Clinical Summary of Care: Patient Declined Electronic Signature(s) Signed: 01/28/2022 5:37:53 PM By: Karie Schwalbe RN Entered By: Karie Schwalbe on 01/28/2022  17:29:58 -------------------------------------------------------------------------------- Lower Extremity Assessment Details Patient Name: Date of Service: Kyle LLENDER, [place] 01/28/2022 8:00 A M Medical Record Number: [id number] Patient Account Number: [id number] Date of Birth/Sex: Treating RN: Aug 07, 1995 (26 y.o. Dianna Limbo Primary Care Eleftherios Dudenhoeffer: Oralia Manis Other Clinician: Referring Laurieann Friddle: Treating Lino Wickliff/Extender: Levi Aland in Treatment: 0 Electronic Signature(s) Signed: 01/28/2022 5:37:53 PM By: Karie Schwalbe RN Entered By: Karie Schwalbe on 01/28/2022 08:50:14 -------------------------------------------------------------------------------- Multi Wound Chart Details Patient Name: Date of Service: [name] 01/28/2022 8:00 A M Medical Record Number: [id number] Patient Account Number: [id number] Date of Birth/Sex: Treating RN: 1995/06/23 (26 y.o. Dianna Limbo Primary Care Mackenzie Lia: Oralia Manis Other Clinician: Referring Marce Schartz: Treating Joletta Manner/Extender: Levi Aland in Treatment: 0 Vital Signs Height(in): Pulse(bpm): 91 Weight(lbs): Blood Pressure(mmHg): 134/85 Body Mass Index(BMI): Temperature(F): 98.7 Respiratory Rate(breaths/min): 16 Photos: Left, Anterior Lower Leg Left, Lateral Knee Left, Lateral Lower Leg Wound Location: Surgical Injury Surgical Injury Surgical Injury Wounding Event: Lesion Lesion Lesion Primary Etiology: 01/04/2022 01/04/2022 01/04/2022 Date A  cquired: 0 0 0 Weeks of Treatment: Open Open Open Wound Status: No No No Wound Recurrence: 7.8x4.3x1.5 2x1x0.3 5x2x0.7 Measurements L x W x D (cm) 26.342 1.571 7.854 A (cm) : rea 39.513 0.471 5.498 Volume (cm) : Partial Thickness Partial Thickness Partial Thickness Classification: Debridement - Excisional Debridement - Selective/Open Wound Debridement - Selective/Open Wound Debridement: 09:01  09:01 09:01 Pre-procedure Verification/Time Out Taken: Lidocaine 5% topical ointment Lidocaine 5% topical ointment Lidocaine 5% topical ointment Pain Control: Subcutaneous, [facility] Tissue Debrided: Skin/Subcutaneous Tissue Non-Viable Tissue Non-Viable Tissue Level: 34.32 2 10 Debridement A (sq cm): rea Curette Curette Curette Instrument: Minimum Minimum Minimum Bleeding: Pressure Pressure Pressure Hemostasis A chieved: 0 0 0 Procedural Pain: 0 0 0 Post Procedural Pain: Procedure was tolerated well Procedure was tolerated well Procedure was tolerated well Debridement Treatment Response: 7.8x4.3x1.5 2x1x0.3 5x2x0.7 Post Debridement Measurements L x W x D (cm) 39.513 0.471 5.498 Post Debridement Volume: (cm) Debridement Debridement Debridement Procedures Performed: Treatment Notes Electronic Signature(s) Signed: 01/28/2022 9:29:21 AM By: [name] MD FACS Signed: 01/28/2022 5:37:53 PM By: [name] RN Entered By: [name] on 01/28/2022 09:29:20 -------------------------------------------------------------------------------- Morrison Details Patient Name: Date of Service: [facility], [place] 01/28/2022 8:00 A M Medical Record Number: [id number] Patient Account Number: [id number] Date of Birth/Sex: Treating RN: 07-May-1996 (26 y.o. Collene Gobble Primary Care Aleysha Meckler: Tera Partridge Other Clinician: Referring Quenna Doepke: Treating Francys Bolin/Extender: Edwena Blow in Treatment: 0 Active Inactive Wound/Skin Impairment Nursing Diagnoses: Knowledge deficit related to ulceration/compromised skin integrity Goals: Patient/caregiver will verbalize understanding of skin care regimen Date Initiated: 01/28/2022 Target Resolution Date: 06/05/2022 Goal Status: Active Interventions: Assess ulceration(s) every visit Treatment Activities: Skin care regimen initiated : 01/28/2022 Notes: Electronic  Signature(s) Signed: 01/28/2022 5:37:53 PM By: [name] RN Entered By: [name] on 01/28/2022 17:24:43 -------------------------------------------------------------------------------- Pain Assessment Details Patient Name: Date of Service: [name], [place] 01/28/2022 8:00 A M Medical Record Number: [id number] Patient Account Number: [id number] Date of Birth/Sex: Treating RN: Jan 08, 1996 (26 y.o. Collene Gobble Primary Care Oree Hislop: Tera Partridge Other Clinician: Referring Inetta Dicke: Treating Arelis Neumeier/Extender: Edwena Blow in Treatment: 0 Active Problems Location of Pain Severity and Description of Pain Patient Has Paino No Site Locations Pain Management and Medication Current Pain Management: Electronic Signature(s) Signed: 01/28/2022 5:37:53 PM By: [name] RN Entered By: [name] on 01/28/2022 08:54:41 -------------------------------------------------------------------------------- Patient/Caregiver Education Details Patient Name: Date of Service: [name], [place] 8/25/2023andnbsp8:00 A M Medical Record Number: [id number] Patient Account Number: [id number] Date of Birth/Gender: Treating RN: 27-Sep-1995 (26 y.o. Collene Gobble Primary Care Physician: Tera Partridge Other Clinician: Referring Physician: Treating Physician/Extender: Edwena Blow in Treatment: 0 Education Assessment Education Provided To: Patient Education Topics Provided Wound/Skin Impairment: Methods: Explain/Verbal Responses: Return demonstration correctly Electronic Signature(s) Signed: 01/28/2022 5:37:53 PM By: [name] RN Entered By: [name] on 01/28/2022 17:24:57 -------------------------------------------------------------------------------- Wound Assessment Details Patient Name: Date of Service: Kyle Vela Prose, [place] 01/28/2022 8:00 A M Medical Record Number: [id number] Patient Account  Number: [id number] Date of Birth/Sex: Treating RN: [birth year] (26 y.o. Collene Gobble Primary Care Cadience Bradfield: Tera Partridge Other Clinician: Referring Lucie Friedlander: Treating Wilder Kurowski/Extender: Edwena Blow in Treatment: 0 Wound Status Wound Number: 1 Primary Etiology: Lesion Wound Location: Left, Anterior Lower Leg Wound Status: Open Wounding Event: Surgical Injury Date Acquired: 01/04/2022 Weeks Of Treatment: 0 Clustered Wound: No Photos Wound Measurements Length: (cm) 7.8 Width: (cm) 4.3 Depth: (cm) 1.5 Area: (cm) 26.342 Volume: (  cm) 39.513 % Reduction in Area: 0% % Reduction in Volume: 0% Epithelialization: None Tunneling: No Undermining: No Wound Description Classification: Full Thickness Without Exposed Support Structures Exudate Amount: Medium Exudate Type: Serosanguineous Exudate Color: red, brown Foul Odor After Cleansing: No Slough/Fibrino Yes Wound Bed Granulation Amount: Medium (34-66%) Exposed Structure Granulation Quality: Red Fascia Exposed: No Necrotic Amount: Medium (34-66%) Fat Layer (Subcutaneous Tissue) Exposed: Yes Necrotic Quality: Adherent Slough Tendon Exposed: No Muscle Exposed: No Joint Exposed: No Bone Exposed: No Treatment Notes Wound #1 (Lower Leg) Wound Laterality: Left, Anterior Cleanser Soap and Water Discharge Instruction: May shower and wash wound with dial antibacterial soap and water prior to dressing change. Wound Cleanser Discharge Instruction: Cleanse the wound with wound cleanser prior to applying a clean dressing using gauze sponges, not tissue or cotton balls. Peri-Wound Care Topical Primary Dressing Santyl Ointment Discharge Instruction: Apply nickel thick amount to wound bed as instructed Secondary Dressing ABD Pad, 8x10 Discharge Instruction: Apply over primary dressing as directed. Woven Gauze Sponge, Non-Sterile 4x4 in Discharge Instruction: Apply over primary dressing as  directed. Secured With Paper Tape, 2x10 (in/yd) Discharge Instruction: Secure dressing with tape as directed. Compression Wrap ThreePress (3 layer compression wrap) Discharge Instruction: Apply three layer compression as directed. Netting Tubular Discharge Instruction: on left leg Compression Stockings Add-Ons Electronic Signature(s) Signed: 01/28/2022 5:37:53 PM By: [name] RN Entered By: [name] on 01/28/2022 09:30:20 -------------------------------------------------------------------------------- Wound Assessment Details Patient Name: Date of Service: Kyle Vela Prose, [place] 01/28/2022 8:00 A M Medical Record Number: [id number] Patient Account Number: [id number] Date of Birth/Sex: Treating RN: 06-Oct-1995 (26 y.o. Collene Gobble Primary Care Srihitha Tagliaferri: Tera Partridge Other Clinician: Referring Tranisha Tissue: Treating Nema Oatley/Extender: Edwena Blow in Treatment: 0 Wound Status Wound Number: 2 Primary Etiology: Lesion Wound Location: Left, Lateral Knee Wound Status: Open Wounding Event: Surgical Injury Date Acquired: 01/04/2022 Weeks Of Treatment: 0 Clustered Wound: No Photos Wound Measurements Length: (cm) 2 Width: (cm) 1 Depth: (cm) 0.3 Area: (cm) 1.571 Volume: (cm) 0.471 % Reduction in Area: 0% % Reduction in Volume: 0% Epithelialization: Large (67-100%) Tunneling: No Undermining: No Wound Description Classification: Full Thickness Without Exposed Support Structures Wound Margin: Distinct, outline attached Exudate Amount: Medium Exudate Type: Serosanguineous Exudate Color: red, brown Foul Odor After Cleansing: No Slough/Fibrino Yes Wound Bed Granulation Amount: Medium (34-66%) Exposed Structure Granulation Quality: Red Fascia Exposed: No Necrotic Amount: Medium (34-66%) Fat Layer (Subcutaneous Tissue) Exposed: Yes Necrotic Quality: Adherent Slough Tendon Exposed: No Muscle Exposed: No Joint Exposed: No Bone  Exposed: No Treatment Notes Wound #2 (Knee) Wound Laterality: Left, Lateral Cleanser Soap and Water Discharge Instruction: May shower and wash wound with dial antibacterial soap and water prior to dressing change. Wound Cleanser Discharge Instruction: Cleanse the wound with wound cleanser prior to applying a clean dressing using gauze sponges, not tissue or cotton balls. Peri-Wound Care Topical Primary Dressing Santyl Ointment Discharge Instruction: Apply nickel thick amount to wound bed as instructed Secondary Dressing ABD Pad, 8x10 Discharge Instruction: Apply over primary dressing as directed. Woven Gauze Sponge, Non-Sterile 4x4 in Discharge Instruction: Apply over primary dressing as directed. Secured With Paper Tape, 2x10 (in/yd) Discharge Instruction: Secure dressing with tape as directed. Compression Wrap Netting Tubular Discharge Instruction: on left leg Compression Stockings Add-Ons Electronic Signature(s) Signed: 01/28/2022 5:37:53 PM By: [name] RN Entered By: [name] on 01/28/2022 09:30:48 -------------------------------------------------------------------------------- Wound Assessment Details Patient Name: Date of Service: Kyle Vela Prose, [place] 01/28/2022 8:00 A M Medical Record Number: [id number] Patient Account Number: [id number]  Date of Birth/Sex: Treating RN: 1995/07/30 (26 y.o. Collene Gobble Primary Care Shelbe Haglund: Tera Partridge Other Clinician: Referring Kaleya Douse: Treating Rissa Turley/Extender: Edwena Blow in Treatment: 0 Wound Status Wound Number: 3 Primary Etiology: Lesion Wound Location: Left, Lateral Lower Leg Wound Status: Open Wounding Event: Surgical Injury Date Acquired: 01/04/2022 Weeks Of Treatment: 0 Clustered Wound: No Photos Wound Measurements Length: (cm) 5 Width: (cm) 2 Depth: (cm) 0.7 Area: (cm) 7.854 Volume: (cm) 5.498 % Reduction in Area: 0% % Reduction in Volume: 0% Wound  Description Classification: Full Thickness Without Exposed Support Structures Exudate Amount: Medium Exudate Type: Serosanguineous Exudate Color: red, brown Foul Odor After Cleansing: No Slough/Fibrino Yes Wound Bed Granulation Amount: Medium (34-66%) Exposed Structure Granulation Quality: Red Fascia Exposed: No Necrotic Amount: Medium (34-66%) Fat Layer (Subcutaneous Tissue) Exposed: Yes Necrotic Quality: Adherent Slough Tendon Exposed: No Muscle Exposed: No Joint Exposed: No Bone Exposed: No Treatment Notes Wound #3 (Lower Leg) Wound Laterality: Left, Lateral Cleanser Soap and Water Discharge Instruction: May shower and wash wound with dial antibacterial soap and water prior to dressing change. Wound Cleanser Discharge Instruction: Cleanse the wound with wound cleanser prior to applying a clean dressing using gauze sponges, not tissue or cotton balls. Peri-Wound Care Topical Primary Dressing Santyl Ointment Discharge Instruction: Apply nickel thick amount to wound bed as instructed Secondary Dressing Woven Gauze Sponge, Non-Sterile 4x4 in Discharge Instruction: Apply over primary dressing as directed. Secured With Paper Tape, 2x10 (in/yd) Discharge Instruction: Secure dressing with tape as directed. Compression Wrap ThreePress (3 layer compression wrap) Discharge Instruction: Apply three layer compression as directed. Compression Stockings Add-Ons Electronic Signature(s) Signed: 01/28/2022 5:37:53 PM By: [name] RN Entered By: [name] on 01/28/2022 09:31:19 -------------------------------------------------------------------------------- Vitals Details Patient Name: Date of Service: Kyle LLENDER, [place] 01/28/2022 8:00 A M Medical Record Number: [id number] Patient Account Number: [id number] Date of Birth/Sex: Treating RN: 03-Mar-1996 (26 y.o. Collene Gobble Primary Care Tiarrah Saville: Tera Partridge Other Clinician: Referring Keri Veale: Treating  Inaki Vantine/Extender: Edwena Blow in Treatment: 0 Vital Signs Time Taken: 08:33 Temperature (F): 98.7 Pulse (bpm): 91 Respiratory Rate (breaths/min): 16 Blood Pressure (mmHg): 134/85 Reference Range: 80 - 120 mg / dl Electronic Signature(s) Signed: 01/28/2022 5:37:53 PM By: [name] RN Entered By: [name] on 01/28/2022 08:33:53

## 2022-02-04 ENCOUNTER — Encounter (HOSPITAL_BASED_OUTPATIENT_CLINIC_OR_DEPARTMENT_OTHER): Payer: MEDICAID | Admitting: General Surgery

## 2022-02-08 ENCOUNTER — Encounter (HOSPITAL_BASED_OUTPATIENT_CLINIC_OR_DEPARTMENT_OTHER): Payer: 59 | Attending: General Surgery | Admitting: General Surgery

## 2022-02-08 DIAGNOSIS — Z6841 Body Mass Index (BMI) 40.0 and over, adult: Secondary | ICD-10-CM | POA: Diagnosis not present

## 2022-02-08 DIAGNOSIS — I10 Essential (primary) hypertension: Secondary | ICD-10-CM | POA: Insufficient documentation

## 2022-02-08 DIAGNOSIS — L97822 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sed: Secondary | ICD-10-CM | POA: Insufficient documentation

## 2022-02-08 DIAGNOSIS — Z87891 Personal history of nicotine dependence: Secondary | ICD-10-CM | POA: Insufficient documentation

## 2022-02-08 NOTE — Progress Notes (Signed)
LONIE, NEWSHAM ([id number]) Visit Report for 02/08/2022 Arrival Information Details Patient Name: Date of Service: Rosiland Oz 02/08/2022 3:00 PM Medical Record Number: [id number] Patient Account Number: [id number] Date of Birth/Sex: Treating RN: August 18, 1995 (27 y.o. [name] Primary Care Collie Wernick: Oralia Manis Other Clinician: Referring Lucilla Petrenko: Treating Lathon Adan/Extender: Levi Aland in Treatment: 1 Visit Information History Since Last Visit Added or deleted any medications: No Patient Arrived: Gilmer Mor Any new allergies or adverse reactions: No Arrival Time: 15:28 Had a fall or experienced change in No Accompanied By: self activities of daily living that may affect Transfer Assistance: None risk of falls: Patient Identification Verified: Yes Signs or symptoms of abuse/neglect since last visito No Secondary Verification Process Completed: Yes Hospitalized since last visit: No Implantable device outside of the clinic excluding No cellular tissue based products placed in the center since last visit: Has Dressing in Place as Prescribed: Yes Has Compression in Place as Prescribed: No Pain Present Now: No Electronic Signature(s) Signed: 02/08/2022 5:49:01 PM By: Samuella Bruin Entered By: Samuella Bruin on 02/08/2022 15:28:49 -------------------------------------------------------------------------------- Compression Therapy Details Patient Name: Date of Service: HO Reuben Likes, RO BERT 02/08/2022 3:00 PM Medical Record Number: [id number] Patient Account Number: [id number] Date of Birth/Sex: Treating RN: 03/25/1996 (26 y.o. [name] Primary Care Shelie Lansing: Oralia Manis Other Clinician: Referring Kathlyn Leachman: Treating Bertrum Helmstetter/Extender: Levi Aland in Treatment: 1 Compression Therapy Performed for Wound Assessment: Wound #1 Left,Anterior Lower Leg Performed By: Clinician Samuella Bruin,  RN Compression Type: Three Layer Post Procedure Diagnosis Same as Pre-procedure Electronic Signature(s) Signed: 02/08/2022 5:49:50 PM By: Samuella Bruin Entered By: Samuella Bruin on 02/08/2022 17:49:36 -------------------------------------------------------------------------------- Encounter Discharge Information Details Patient Name: Date of Service: HO Reuben Likes, RO BERT 02/08/2022 3:00 PM Medical Record Number: [id number] Patient Account Number: [id number] Date of Birth/Sex: Treating RN: 1995/09/14 (26 y.o. [name] Primary Care Deondre Marinaro: Oralia Manis Other Clinician: Referring Wentworth Edelen: Treating Tanique Matney/Extender: Levi Aland in Treatment: 1 Encounter Discharge Information Items Post Procedure Vitals Discharge Condition: Stable Temperature (F): 98 Ambulatory Status: Cane Pulse (bpm): 93 Discharge Destination: Home Respiratory Rate (breaths/min): 16 Transportation: Private Auto Blood Pressure (mmHg): 117/82 Accompanied By: self Schedule Follow-up Appointment: Yes Clinical Summary of Care: Patient Declined Electronic Signature(s) Signed: 02/08/2022 5:49:01 PM By: Samuella Bruin Entered By: Samuella Bruin on 02/08/2022 16:16:47 -------------------------------------------------------------------------------- Lower Extremity Assessment Details Patient Name: Date of Service: HO LLENDER, RO BERT 02/08/2022 3:00 PM Medical Record Number: [id number] Patient Account Number: [id number] Date of Birth/Sex: Treating RN: Apr 07, 1996 (26 y.o. [name] Primary Care Brinkley Peet: Oralia Manis Other Clinician: Referring Even Budlong: Treating Shelie Lansing/Extender: Levi Aland in Treatment: 1 Edema Assessment Assessed: Kyra Searles: No] Franne Forts: No] E[Left: dema] [Right: :] Calf Left: Right: Point of Measurement: From Medial Instep 48 cm Ankle Left: Right: Point of Measurement: From Medial Instep 28.5  cm Vascular Assessment Pulses: Dorsalis Pedis Palpable: [Left:Yes] Electronic Signature(s) Signed: 02/08/2022 5:49:01 PM By: Samuella Bruin Entered By: Samuella Bruin on 02/08/2022 15:33:22 -------------------------------------------------------------------------------- Multi Wound Chart Details Patient Name: Date of Service: HO Reuben Likes, RO BERT 02/08/2022 3:00 PM Medical Record Number: [id number] Patient Account Number: [id number] Date of Birth/Sex: Treating RN: March 18, 1996 (26 y.o. [name] Primary Care Laith Antonelli: Oralia Manis Other Clinician: Referring Aidaly Cordner: Treating Teriyah Purington/Extender: Levi Aland in Treatment: 1 Vital Signs Height(in): 71 Pulse(bpm): 93 Weight(lbs): 300 Blood Pressure(mmHg): 117/82 Body Mass Index(BMI): 41.8 Temperature(F): 98 Respiratory Rate(breaths/min): 16 Photos: Left, Anterior Lower Leg Left, Lateral Knee Left, Lateral Lower Leg Wound  Location: Surgical Injury Surgical Injury Surgical Injury Wounding Event: Lesion Lesion Lesion Primary Etiology: 01/04/2022 01/04/2022 01/04/2022 Date Acquired: 1 1 1  Weeks of Treatment: Open Open Open Wound Status: No No No Wound Recurrence: 7.4x4x0.4 1.9x0.7x0.1 3.6x1.5x0.4 Measurements L x W x D (cm) 23.248 1.045 4.241 A (cm) : rea 9.299 0.104 1.696 Volume (cm) : 11.70% 33.50% 46.00% % Reduction in A rea: 76.50% 77.90% 69.20% % Reduction in Volume: Full Thickness Without Exposed Full Thickness Without Exposed Full Thickness Without Exposed Classification: Support Structures Support Structures Support Structures Medium Medium Medium Exudate A mount: Serosanguineous Serosanguineous Serosanguineous Exudate Type: red, brown red, brown red, brown Exudate Color: Distinct, outline attached Distinct, outline attached Distinct, outline attached Wound Margin: Medium (34-66%) Medium (34-66%) Medium (34-66%) Granulation A mount: Red Red Red Granulation  Quality: Medium (34-66%) Medium (34-66%) Medium (34-66%) Necrotic A mount: Adherent Slough Eschar, Adherent Slough Adherent Slough Necrotic Tissue: Fat Layer (Subcutaneous Tissue): Yes Fat Layer (Subcutaneous Tissue): Yes Fat Layer (Subcutaneous Tissue): Yes Exposed Structures: Fascia: No Fascia: No Fascia: No Tendon: No Tendon: No Tendon: No Muscle: No Muscle: No Muscle: No Joint: No Joint: No Joint: No Bone: No Bone: No Bone: No Small (1-33%) Large (67-100%) Small (1-33%) Epithelialization: Debridement - Excisional Debridement - Excisional Debridement - Excisional Debridement: Pre-procedure Verification/Time Out 15:44 15:44 15:44 Taken: Lidocaine 5% topical ointment Lidocaine 5% topical ointment Lidocaine 5% topical ointment Pain Control: Subcutaneous, Slough Subcutaneous, Slough Subcutaneous, Slough Tissue Debrided: Skin/Subcutaneous Tissue Skin/Subcutaneous Tissue Skin/Subcutaneous Tissue Level: 29.6 1.33 5.4 Debridement A (sq cm): rea Curette Curette Curette Instrument: Minimum Minimum Minimum Bleeding: Pressure Pressure Pressure Hemostasis A chieved: 0 0 0 Procedural Pain: 0 0 0 Post Procedural Pain: Procedure was tolerated well Procedure was tolerated well Procedure was tolerated well Debridement Treatment Response: 7.4x4x0.4 1.9x0.7x0.1 3.6x1.5x0.4 Post Debridement Measurements L x W x D (cm) 9.299 0.104 1.696 Post Debridement Volume: (cm) Debridement Debridement Debridement Procedures Performed: Treatment Notes Electronic Signature(s) Signed: 02/08/2022 3:52:45 PM By: 04/10/2022 MD FACS Signed: 02/08/2022 5:49:01 PM By: 04/10/2022 By: Gelene Mink on 02/08/2022 15:52:45 -------------------------------------------------------------------------------- Multi-Disciplinary Care Plan Details Patient Name: Date of Service: HO LLENDER, RO BERT 02/08/2022 3:00 PM Medical Record Number: 04/10/2022 Patient Account Number:  [id number] Date of Birth/Sex: Treating RN: [birth year] (26 y.o. 34 Primary Care Micheala Morissette: [name] Other Clinician: Referring Harlym Gehling: Treating Dierra Riesgo/Extender: Oralia Manis in Treatment: 1 Active Inactive Wound/Skin Impairment Nursing Diagnoses: Knowledge deficit related to ulceration/compromised skin integrity Goals: Patient/caregiver will verbalize understanding of skin care regimen Date Initiated: 01/28/2022 Target Resolution Date: 06/05/2022 Goal Status: Active Interventions: Assess ulceration(s) every visit Treatment Activities: Skin care regimen initiated : 01/28/2022 Notes: Electronic Signature(s) Signed: 02/08/2022 5:49:01 PM By: 04/10/2022 Entered By: Samuella Bruin on 02/08/2022 15:43:23 -------------------------------------------------------------------------------- Pain Assessment Details Patient Name: Date of Service: HO 04/10/2022, RO BERT 02/08/2022 3:00 PM Medical Record Number: 04/10/2022 Patient Account Number: [id number] Date of Birth/Sex: Treating RN: 09/05/1995 (26 y.o. 33 Primary Care Eliaz Fout: [name] Other Clinician: Referring Laquasia Pincus: Treating Veretta Sabourin/Extender: Oralia Manis in Treatment: 1 Active Problems Location of Pain Severity and Description of Pain Patient Has Paino No Site Locations Rate the pain. Rate the pain. Current Pain Level: 0 Pain Management and Medication Current Pain Management: Electronic Signature(s) Signed: 02/08/2022 5:49:01 PM By: 04/10/2022 Entered By: Samuella Bruin on 02/08/2022 15:29:44 -------------------------------------------------------------------------------- Patient/Caregiver Education Details Patient Name: Date of Service: HO 04/10/2022, RO BERT 9/5/2023andnbsp3:00 PM Medical Record Number: 04/10/2022 Patient Account Number: [id number] Date of  Birth/Gender: Treating RN: 11/27/1995  (26 y.o. Marlan PalauM) Herrington, Taylor Primary Care Physician: Oralia ManisKashddan, Daniel Other Clinician: Referring Physician: Treating Physician/Extender: Levi Alandannon, Jennifer Kashddan, Daniel Weeks in Treatment: 1 Education Assessment Education Provided To: Patient Education Topics Provided Wound/Skin Impairment: Methods: Explain/Verbal Responses: Reinforcements needed, State content correctly Electronic Signature(s) Signed: 02/08/2022 5:49:01 PM By: Samuella BruinHerrington, Taylor Entered By: Samuella BruinHerrington, Taylor on 02/08/2022 15:43:32 -------------------------------------------------------------------------------- Wound Assessment Details Patient Name: Date of Service: HO Reuben LikesLLENDER, RO BERT 02/08/2022 3:00 PM Medical Record Number: [id number] Patient Account Number: [id number] Date of Birth/Sex: Treating RN: 03/25/1996 (26 y.o. Marlan PalauM) Herrington, Taylor Primary Care Berkley Wrightsman: Oralia ManisKashddan, Daniel Other Clinician: Referring Augusta Mirkin: Treating Jennise Both/Extender: Levi Alandannon, Jennifer Kashddan, Daniel Weeks in Treatment: 1 Wound Status Wound Number: 1 Primary Etiology: Lesion Wound Location: Left, Anterior Lower Leg Wound Status: Open Wounding Event: Surgical Injury Date Acquired: 01/04/2022 Weeks Of Treatment: 1 Clustered Wound: No Photos Wound Measurements Length: (cm) 7.4 Width: (cm) 4 Depth: (cm) 0.4 Area: (cm) 23.248 Volume: (cm) 9.299 % Reduction in Area: 11.7% % Reduction in Volume: 76.5% Epithelialization: Small (1-33%) Tunneling: No Undermining: No Wound Description Classification: Full Thickness Without Exposed Support Structures Wound Margin: Distinct, outline attached Exudate Amount: Medium Exudate Type: Serosanguineous Exudate Color: red, brown Foul Odor After Cleansing: No Slough/Fibrino Yes Wound Bed Granulation Amount: Medium (34-66%) Exposed Structure Granulation Quality: Red Fascia Exposed: No Necrotic Amount: Medium (34-66%) Fat Layer (Subcutaneous Tissue) Exposed: Yes Necrotic Quality:  Adherent Slough Tendon Exposed: No Muscle Exposed: No Joint Exposed: No Bone Exposed: No Treatment Notes Wound #1 (Lower Leg) Wound Laterality: Left, Anterior Cleanser Soap and Water Discharge Instruction: May shower and wash wound with dial antibacterial soap and water prior to dressing change. Wound Cleanser Discharge Instruction: Cleanse the wound with wound cleanser prior to applying a clean dressing using gauze sponges, not tissue or cotton balls. Peri-Wound Care Topical Primary Dressing Santyl Ointment Discharge Instruction: Apply nickel thick amount to wound bed as instructed Secondary Dressing ABD Pad, 8x10 Discharge Instruction: Apply over primary dressing as directed. Woven Gauze Sponge, Non-Sterile 4x4 in Discharge Instruction: Apply over primary dressing as directed. Secured With Paper Tape, 2x10 (in/yd) Discharge Instruction: Secure dressing with tape as directed. Compression Wrap ThreePress (3 layer compression wrap) Discharge Instruction: Apply three layer compression as directed. Unnaboot w/Calamine, 4x10 (in/yd) Discharge Instruction: Apply Unnaboot only below the knee, Netting Tubular Discharge Instruction: on left leg Compression Stockings Add-Ons Electronic Signature(s) Signed: 02/08/2022 5:49:01 PM By: Samuella BruinHerrington, Taylor Entered By: Samuella BruinHerrington, Taylor on 02/08/2022 15:37:44 -------------------------------------------------------------------------------- Wound Assessment Details Patient Name: Date of Service: HO Reuben LikesLLENDER, RO BERT 02/08/2022 3:00 PM Medical Record Number: [id number] Patient Account Number: [id number] Date of Birth/Sex: Treating RN: 07/24/1995 (26 y.o. Marlan PalauM) Herrington, Taylor Primary Care Janeese Mcgloin: Oralia ManisKashddan, Daniel Other Clinician: Referring Neliah Cuyler: Treating Malisha Mabey/Extender: Levi Alandannon, Jennifer Kashddan, Daniel Weeks in Treatment: 1 Wound Status Wound Number: 2 Primary Etiology: Lesion Wound Location: Left, Lateral Knee Wound Status:  Open Wounding Event: Surgical Injury Date Acquired: 01/04/2022 Weeks Of Treatment: 1 Clustered Wound: No Photos Wound Measurements Length: (cm) 1.9 Width: (cm) 0.7 Depth: (cm) 0.1 Area: (cm) 1.045 Volume: (cm) 0.104 % Reduction in Area: 33.5% % Reduction in Volume: 77.9% Epithelialization: Large (67-100%) Tunneling: No Undermining: No Wound Description Classification: Full Thickness Without Exposed Support Structures Wound Margin: Distinct, outline attached Exudate Amount: Medium Exudate Type: Serosanguineous Exudate Color: red, brown Foul Odor After Cleansing: No Slough/Fibrino Yes Wound Bed Granulation Amount: Medium (34-66%) Exposed Structure Granulation Quality: Red Fascia Exposed: No Necrotic Amount: Medium (34-66%) Fat Layer (Subcutaneous Tissue) Exposed: Yes Necrotic Quality:  Eschar, Adherent Slough Tendon Exposed: No Muscle Exposed: No Joint Exposed: No Bone Exposed: No Treatment Notes Wound #2 (Knee) Wound Laterality: Left, Lateral Cleanser Soap and Water Discharge Instruction: May shower and wash wound with dial antibacterial soap and water prior to dressing change. Wound Cleanser Discharge Instruction: Cleanse the wound with wound cleanser prior to applying a clean dressing using gauze sponges, not tissue or cotton balls. Peri-Wound Care Topical Primary Dressing Santyl Ointment Discharge Instruction: Apply nickel thick amount to wound bed as instructed Secondary Dressing ABD Pad, 8x10 Discharge Instruction: Apply over primary dressing as directed. Woven Gauze Sponge, Non-Sterile 4x4 in Discharge Instruction: Apply over primary dressing as directed. Secured With Paper Tape, 2x10 (in/yd) Discharge Instruction: Secure dressing with tape as directed. Compression Wrap Netting Tubular Discharge Instruction: on left leg Compression Stockings Add-Ons Electronic Signature(s) Signed: 02/08/2022 5:49:01 PM By: Samuella Bruin Entered By: Samuella Bruin on 02/08/2022 15:38:00 -------------------------------------------------------------------------------- Wound Assessment Details Patient Name: Date of Service: HO Reuben Likes, RO BERT 02/08/2022 3:00 PM Medical Record Number: [id number] Patient Account Number: [id number] Date of Birth/Sex: Treating RN: Oct 10, 1995 (26 y.o. [name] Primary Care Ivanna Kocak: Oralia Manis Other Clinician: Referring Aadit Hagood: Treating Cariana Karge/Extender: Levi Aland in Treatment: 1 Wound Status Wound Number: 3 Primary Etiology: Lesion Wound Location: Left, Lateral Lower Leg Wound Status: Open Wounding Event: Surgical Injury Date Acquired: 01/04/2022 Weeks Of Treatment: 1 Clustered Wound: No Photos Wound Measurements Length: (cm) 3.6 Width: (cm) 1.5 Depth: (cm) 0.4 Area: (cm) 4.241 Volume: (cm) 1.696 % Reduction in Area: 46% % Reduction in Volume: 69.2% Epithelialization: Small (1-33%) Tunneling: No Undermining: No Wound Description Classification: Full Thickness Without Exposed Support Structures Wound Margin: Distinct, outline attached Exudate Amount: Medium Exudate Type: Serosanguineous Exudate Color: red, brown Foul Odor After Cleansing: No Slough/Fibrino Yes Wound Bed Granulation Amount: Medium (34-66%) Exposed Structure Granulation Quality: Red Fascia Exposed: No Necrotic Amount: Medium (34-66%) Fat Layer (Subcutaneous Tissue) Exposed: Yes Necrotic Quality: Adherent Slough Tendon Exposed: No Muscle Exposed: No Joint Exposed: No Bone Exposed: No Treatment Notes Wound #3 (Lower Leg) Wound Laterality: Left, Lateral Cleanser Soap and Water Discharge Instruction: May shower and wash wound with dial antibacterial soap and water prior to dressing change. Wound Cleanser Discharge Instruction: Cleanse the wound with wound cleanser prior to applying a clean dressing using gauze sponges, not tissue or cotton balls. Peri-Wound  Care Topical Primary Dressing Santyl Ointment Discharge Instruction: Apply nickel thick amount to wound bed as instructed Secondary Dressing Woven Gauze Sponge, Non-Sterile 4x4 in Discharge Instruction: Apply over primary dressing as directed. Secured With Paper Tape, 2x10 (in/yd) Discharge Instruction: Secure dressing with tape as directed. Compression Wrap ThreePress (3 layer compression wrap) Discharge Instruction: Apply three layer compression as directed. Unnaboot w/Calamine, 4x10 (in/yd) Discharge Instruction: Apply Unnaboot only below the knee. Compression Stockings Add-Ons Electronic Signature(s) Signed: 02/08/2022 5:49:01 PM By: Samuella Bruin Signed: 02/08/2022 5:49:01 PM By: Gelene Mink By: Samuella Bruin on 02/08/2022 15:38:22 -------------------------------------------------------------------------------- Vitals Details Patient Name: Date of Service: HO LLENDER, RO BERT 02/08/2022 3:00 PM Medical Record Number: [id number] Patient Account Number: [id number] Date of Birth/Sex: Treating RN: 1996/05/18 (26 y.o. [name] Primary Care Verlean Allport: Oralia Manis Other Clinician: Referring Brynja Marker: Treating Malissa Slay/Extender: Levi Aland in Treatment: 1 Vital Signs Time Taken: 15:28 Temperature (F): 98 Height (in): 71 Pulse (bpm): 93 Source: Stated Respiratory Rate (breaths/min): 16 Weight (lbs): 300 Blood Pressure (mmHg): 117/82 Source: Stated Reference Range: 80 - 120 mg / dl Body Mass Index (BMI): 41.8  Electronic Signature(s) Signed: 02/08/2022 5:49:01 PM By: Samuella Bruin Entered By: Samuella Bruin on 02/08/2022 15:29:39

## 2022-02-08 NOTE — Progress Notes (Addendum)
LAVANCE, BEAZER ([id number]) Visit Report for 02/08/2022 Chief Complaint Document Details Patient Name: Date of Service: [name], [place] BERT 02/08/2022 3:00 PM Medical Record Number: [id number] Patient Account Number: [id number] Date of Birth/Sex: Treating RN: [birth year] (26 y.o. [name] Primary Care Provider: Oralia Manis Other Clinician: Referring Provider: Treating Provider/Extender: Levi Aland in Treatment: 1 Information Obtained from: Patient Chief Complaint Patient presents to the wound care center with open non-healing surgical wound(s) Electronic Signature(s) Signed: 02/08/2022 3:52:53 PM By: Duanne Guess MD FACS Entered By: Duanne Guess on 02/08/2022 15:52:53 -------------------------------------------------------------------------------- Debridement Details Patient Name: Date of Service: HO Reuben Likes, RO BERT 02/08/2022 3:00 PM Medical Record Number: [id number] Patient Account Number: [id number] Date of Birth/Sex: Treating RN: 03-29-1996 (26 y.o. [name] Primary Care Provider: Oralia Manis Other Clinician: Referring Provider: Treating Provider/Extender: Levi Aland in Treatment: 1 Debridement Performed for Assessment: Wound #2 Left,Lateral Knee Performed By: Physician Duanne Guess, MD Debridement Type: Debridement Level of Consciousness (Pre-procedure): Awake and Alert Pre-procedure Verification/Time Out Yes - 15:44 Taken: Start Time: 15:44 Pain Control: Lidocaine 5% topical ointment T Area Debrided (L x W): otal 1.9 (cm) x 0.7 (cm) = 1.33 (cm) Tissue and other material debrided: Non-Viable, Slough, Subcutaneous, Slough Level: Skin/Subcutaneous Tissue Debridement Description: Excisional Instrument: Curette Bleeding: Minimum Hemostasis Achieved: Pressure Procedural Pain: 0 Post Procedural Pain: 0 Response to Treatment: Procedure was tolerated well Level of  Consciousness (Post- Awake and Alert procedure): Post Debridement Measurements of Total Wound Length: (cm) 1.9 Width: (cm) 0.7 Depth: (cm) 0.1 Volume: (cm) 0.104 Character of Wound/Ulcer Post Debridement: Improved Post Procedure Diagnosis Same as Pre-procedure Electronic Signature(s) Signed: 02/08/2022 4:12:06 PM By: Duanne Guess MD FACS Signed: 02/08/2022 5:49:01 PM By: Samuella Bruin Entered By: Samuella Bruin on 02/08/2022 15:46:03 -------------------------------------------------------------------------------- Debridement Details Patient Name: Date of Service: HO LLENDER, RO BERT 02/08/2022 3:00 PM Medical Record Number: [id number] Patient Account Number: [id number] Date of Birth/Sex: Treating RN: 1995/06/11 (26 y.o. [name] Primary Care Provider: Oralia Manis Other Clinician: Referring Provider: Treating Provider/Extender: Levi Aland in Treatment: 1 Debridement Performed for Assessment: Wound #3 Left,Lateral Lower Leg Performed By: Physician Duanne Guess, MD Debridement Type: Debridement Level of Consciousness (Pre-procedure): Awake and Alert Pre-procedure Verification/Time Out Yes - 15:44 Taken: Start Time: 15:44 Pain Control: Lidocaine 5% topical ointment T Area Debrided (L x W): otal 3.6 (cm) x 1.5 (cm) = 5.4 (cm) Tissue and other material debrided: Non-Viable, Slough, Subcutaneous, Slough Level: Skin/Subcutaneous Tissue Debridement Description: Excisional Instrument: Curette Bleeding: Minimum Hemostasis Achieved: Pressure Procedural Pain: 0 Post Procedural Pain: 0 Response to Treatment: Procedure was tolerated well Level of Consciousness (Post- Awake and Alert procedure): Post Debridement Measurements of Total Wound Length: (cm) 3.6 Width: (cm) 1.5 Depth: (cm) 0.4 Volume: (cm) 1.696 Character of Wound/Ulcer Post Debridement: Improved Post Procedure Diagnosis Same as Pre-procedure Electronic  Signature(s) Signed: 02/08/2022 4:12:06 PM By: Duanne Guess MD FACS Signed: 02/08/2022 5:49:01 PM By: Samuella Bruin Entered By: Samuella Bruin on 02/08/2022 15:46:24 -------------------------------------------------------------------------------- Debridement Details Patient Name: Date of Service: HO LLENDER, RO BERT 02/08/2022 3:00 PM Medical Record Number: [id number] Patient Account Number: [id number] Date of Birth/Sex: Treating RN: 07-02-1995 (26 y.o. [name] Primary Care Provider: Oralia Manis Other Clinician: Referring Provider: Treating Provider/Extender: Levi Aland in Treatment: 1 Debridement Performed for Assessment: Wound #1 Left,Anterior Lower Leg Performed By: Physician Duanne Guess, MD Debridement Type: Debridement Level of Consciousness (Pre-procedure): Awake and Alert Pre-procedure Verification/Time Out Yes - 15:44 Taken: Start  Time: 15:44 Pain Control: Lidocaine 5% topical ointment T Area Debrided (L x W): otal 7.4 (cm) x 4 (cm) = 29.6 (cm) Tissue and other material debrided: Non-Viable, Slough, Subcutaneous, Slough Level: Skin/Subcutaneous Tissue Debridement Description: Excisional Instrument: Curette Bleeding: Minimum Hemostasis Achieved: Pressure Procedural Pain: 0 Post Procedural Pain: 0 Response to Treatment: Procedure was tolerated well Level of Consciousness (Post- Awake and Alert procedure): Post Debridement Measurements of Total Wound Length: (cm) 7.4 Width: (cm) 4 Depth: (cm) 0.4 Volume: (cm) 9.299 Character of Wound/Ulcer Post Debridement: Improved Post Procedure Diagnosis Same as Pre-procedure Electronic Signature(s) Signed: 02/08/2022 4:12:06 PM By: Duanne Guess MD FACS Signed: 02/08/2022 5:49:01 PM By: Samuella Bruin Entered By: Samuella Bruin on 02/08/2022 15:48:10 -------------------------------------------------------------------------------- HPI Details Patient Name:  Date of Service: HO LLENDER, RO BERT 02/08/2022 3:00 PM Medical Record Number: [id number] Patient Account Number: [id number] Date of Birth/Sex: Treating RN: [birth year] (26 y.o. 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 He was in [place] and apparently was hospitalized for dehydration and possible sepsis. We do not have any records from his hospitalization, but at some point he underwent surgery and has 3 open wounds. He reports that they did this to "take cultures." Again, no records are available to know if this was done, what those results were, or what antibiotics he was given. He is now here today for further evaluation and management of 3 left lower extremity wounds. He has a wound just caudal to his knee on the lateral aspect of his leg, another on his lateral calf, and a third on his anterior tibial surface. All of them have slough. The anterior tibial surface wound is the largest and also has some necrotic fat present. There is periwound erythema and tenderness. The placement of the incisions does not correlate with 4 compartment fasciotomy so I am not entirely sure what the thought process of the surgeons in [place] was or what specifically they were operating for. Hopefully we will receive those documents before his next visit. 02/08/2022: We have not received any information from [place]. His wounds are little bit smaller today, but still have a fair amount of slough as well as some nonviable subcutaneous tissue present. The culture that I took last week returned positive only for very low levels of methicillin sensitive Staph aureus, nonpathologic levels. Electronic Signature(s) Signed: 02/08/2022 3:54:01 PM  By: Duanne Guess MD FACS Entered By: Duanne Guess on 02/08/2022 15:54:01 -------------------------------------------------------------------------------- Physical Exam Details Patient Name: Date of Service: HO Reuben Likes, RO BERT 02/08/2022 3:00 PM Medical Record Number: [id number] Patient Account Number: [id number] Date of Birth/Sex: Treating RN: 04-Mar-1996 (26 y.o. [name] Primary Care Provider: Oralia Manis Other Clinician: Referring Provider: Treating Provider/Extender: Levi Aland in Treatment: 1 Constitutional . . . . No acute distress.Marland Kitchen Respiratory Normal work of breathing on room air.. Notes 02/08/2022: His wounds are little bit smaller today, but still have a fair amount of slough as well as some nonviable subcutaneous tissue present. Electronic Signature(s) Signed: 02/08/2022 3:55:12 PM By: Duanne Guess MD FACS Entered By: Duanne Guess on 02/08/2022 15:55:12 -------------------------------------------------------------------------------- Physician Orders Details Patient Name: Date of Service: HO Reuben Likes, RO BERT 02/08/2022 3:00 PM Medical Record Number: [id number] Patient Account Number: [id number] Date of Birth/Sex: Treating RN: Jun 18, 1995 (26 y.o. 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 - Dr Lady Garyannon - room 3 - 9/15 at 3:15 PM Return A Anesthetic Wound #1 Left,Anterior Lower Leg (In clinic) Topical Lidocaine 5% applied to wound bed Wound #2 Left,Lateral Knee (In clinic) Topical Lidocaine 5% applied to wound  bed Wound #3 Left,Lateral Lower Leg (In clinic) Topical Lidocaine 5% applied to wound bed Bathing/ Shower/ Hygiene May shower with protection but do not get wound dressing(s) wet. - Please do not get wraps on left leg wet-use a cast protector to cover leg when bathing Edema Control - Lymphedema / SCD / Other Elevate legs to the level of the heart or above for 30 minutes daily and/or when sitting, a frequency of: Avoid standing for long periods of time. Wound Treatment Wound #1 - Lower Leg Wound Laterality: Left, Anterior Cleanser: Soap and Water 1 x Per Week/30 Days Discharge Instructions: May shower and wash wound with dial antibacterial soap and water prior to dressing change. Cleanser: Wound Cleanser (Generic) 1 x Per Week/30 Days Discharge Instructions: Cleanse the wound with wound cleanser prior to applying a clean dressing using gauze sponges, not tissue or cotton balls. Prim Dressing: Santyl Ointment (Generic) 1 x Per Week/30 Days ary Discharge Instructions: Apply nickel thick amount to wound bed as instructed Secondary Dressing: ABD Pad, 8x10 1 x Per Week/30 Days Discharge Instructions: Apply over primary dressing as directed. Secondary Dressing: Woven Gauze Sponge, Non-Sterile 4x4 in 1 x Per Week/30 Days Discharge Instructions: Apply over primary dressing as directed. Secured With: Paper Tape, 2x10 (in/yd) 1 x Per Week/30 Days Discharge Instructions: Secure dressing with tape as directed. Compression Wrap: ThreePress (3 layer compression wrap) 1 x Per Week/30 Days Discharge Instructions: Apply three layer compression as directed. Compression Wrap: Unnaboot w/Calamine, 4x10 (in/yd) 1 x Per Week/30 Days Discharge Instructions: Apply Unnaboot only below the knee, Compression Wrap: Netting Tubular 1 x Per Week/30 Days Discharge Instructions: on left leg Wound #2 - Knee Wound Laterality: Left, Lateral Cleanser: Soap and Water 1 x Per Week/30 Days Discharge Instructions: May shower  and wash wound with dial antibacterial soap and water prior to dressing change. Cleanser: Wound Cleanser (Generic) 1 x Per Week/30 Days Discharge Instructions: Cleanse the wound with wound cleanser prior to applying a clean dressing using gauze sponges, not tissue or cotton balls. Prim Dressing: Santyl Ointment (Generic) 1 x Per Week/30 Days ary Discharge Instructions: Apply nickel thick amount to wound bed as instructed Secondary Dressing: ABD Pad, 8x10 1 x Per Week/30 Days Discharge Instructions: Apply over primary dressing as directed. Secondary Dressing: Woven Gauze Sponge, Non-Sterile 4x4 in 1 x Per Week/30 Days Discharge Instructions: Apply over primary dressing as directed. Secured With: Paper Tape, 2x10 (in/yd) (Generic) 1 x Per Week/30 Days Discharge Instructions: Secure dressing with tape as directed. Compression Wrap: Netting Tubular 1 x Per Week/30 Days Discharge Instructions: on left leg Wound #3 - Lower Leg Wound Laterality: Left, Lateral Cleanser: Soap and Water 1 x Per Day/30 Days Discharge Instructions: May shower and wash wound with dial antibacterial soap and water prior to dressing change. Cleanser: Wound Cleanser (Generic) 1 x Per Day/30 Days Discharge Instructions: Cleanse the wound with wound cleanser prior to applying a  clean dressing using gauze sponges, not tissue or cotton balls. Prim Dressing: Santyl Ointment (Generic) 1 x Per Day/30 Days ary Discharge Instructions: Apply nickel thick amount to wound bed as instructed Secondary Dressing: Woven Gauze Sponge, Non-Sterile 4x4 in (Generic) 1 x Per Day/30 Days Discharge Instructions: Apply over primary dressing as directed. Secured With: Paper Tape, 2x10 (in/yd) (Generic) 1 x Per Day/30 Days Discharge Instructions: Secure dressing with tape as directed. Compression Wrap: ThreePress (3 layer compression wrap) 1 x Per Day/30 Days Discharge Instructions: Apply three layer compression as directed. Compression Wrap:  Unnaboot w/Calamine, 4x10 (in/yd) 1 x Per Day/30 Days Discharge Instructions: Apply Unnaboot only below the knee. Patient Medications llergies: No Known Allergies A Notifications Medication Indication Start End 02/08/2022 lidocaine DOSE topical 5 % ointment - ointment topical Electronic Signature(s) Signed: 02/08/2022 4:12:06 PM By: Duanne Guess MD FACS Entered By: Duanne Guess on 02/08/2022 15:57:54 -------------------------------------------------------------------------------- Problem List Details Patient Name: Date of Service: HO LLENDER, RO BERT 02/08/2022 3:00 PM Medical Record Number: [id number] Patient Account Number: [id number] Date of Birth/Sex: Treating RN: 06-Apr-1996 (26 y.o. [name] Primary Care Provider: Oralia Manis Other Clinician: Referring Provider: Treating Provider/Extender: Levi Aland in Treatment: 1 Active Problems ICD-10 Encounter Code Description Active Date MDM Diagnosis [phone] 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sed8/25/2023 No Yes E66.01 Morbid (severe) obesity due to excess calories 01/28/2022 No Yes Inactive Problems Resolved Problems Electronic Signature(s) Signed: 02/08/2022 3:52:38 PM By: Duanne Guess MD FACS Entered By: Duanne Guess on 02/08/2022 15:52:38 -------------------------------------------------------------------------------- Progress Note Details Patient Name: Date of Service: HO LLENDER, RO BERT 02/08/2022 3:00 PM Medical Record Number: [id number] Patient Account Number: [id number] Date of Birth/Sex: Treating RN: 03/13/1996 (26 y.o. 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 He was in [place] and apparently was hospitalized for dehydration and possible sepsis. We do not have any records from his hospitalization, but at some point he underwent surgery and has 3 open wounds. He reports that they did this to "take cultures." Again, no records are available to know if this was done, what those results were, or what antibiotics he was given. He is now here today for further evaluation and management of 3 left lower extremity wounds. He has a wound just caudal to his knee on the lateral aspect of his leg, another on his lateral calf, and a third on his anterior tibial surface. All of them have slough. The anterior tibial surface wound is the largest and also has some necrotic fat present. There is periwound erythema and tenderness. The placement of the incisions does not correlate with 4 compartment fasciotomy so I am not entirely sure what the thought process of the surgeons in [place] was or what specifically they were operating for. Hopefully we will receive those documents before his next visit. 02/08/2022: We have not received any information from [place]. His wounds are little bit smaller today, but still have a fair amount of slough as well as some nonviable subcutaneous tissue present. The culture that I took last week returned positive only for very low levels of methicillin sensitive Staph aureus, nonpathologic levels. Patient History  Information obtained from Patient. Social History Former smoker, Alcohol Use - Rarely, Drug Use - No History, Caffeine Use - Moderate. Objective Constitutional No acute distress.. Vitals Time Taken: 3:28 PM, Height: 71 in, Source: Stated, Weight: 300 lbs, Source: Stated, BMI: 41.8, Temperature: 98 F, Pulse: 93 bpm, Respiratory Rate: 16 breaths/min, Blood Pressure:  117/82 mmHg. Respiratory Normal work of breathing on room air.. General Notes: 02/08/2022: His wounds are little bit smaller today, but still have a fair amount of slough as well as some nonviable subcutaneous tissue present. Integumentary (Hair, Skin) Wound #1 status is Open. Original cause of wound was Surgical Injury. The date acquired was: 01/04/2022. The wound has been in treatment 1 weeks. The wound is located on the Left,Anterior Lower Leg. The wound measures 7.4cm length x 4cm width x 0.4cm depth; 23.248cm^2 area and 9.299cm^3 volume. There is Fat Layer (Subcutaneous Tissue) exposed. There is no tunneling or undermining noted. There is a medium amount of serosanguineous drainage noted. The wound margin is distinct with the outline attached to the wound base. There is medium (34-66%) red granulation within the wound bed. There is a medium (34-66%) amount of necrotic tissue within the wound bed including Adherent Slough. Wound #2 status is Open. Original cause of wound was Surgical Injury. The date acquired was: 01/04/2022. The wound has been in treatment 1 weeks. The wound is located on the Left,Lateral Knee. The wound measures 1.9cm length x 0.7cm width x 0.1cm depth; 1.045cm^2 area and 0.104cm^3 volume. There is Fat Layer (Subcutaneous Tissue) exposed. There is no tunneling or undermining noted. There is a medium amount of serosanguineous drainage noted. The wound margin is distinct with the outline attached to the wound base. There is medium (34-66%) red granulation within the wound bed. There is a medium (34-66%) amount of necrotic tissue within the wound bed including Eschar and Adherent Slough. Wound #3 status is Open. Original cause of wound was Surgical Injury. The date acquired was: 01/04/2022. The wound has been in treatment 1 weeks. The wound is located on the Left,Lateral Lower Leg. The wound measures 3.6cm length x 1.5cm width x 0.4cm depth; 4.241cm^2 area and 1.696cm^3 volume. There is  Fat Layer (Subcutaneous Tissue) exposed. There is no tunneling or undermining noted. There is a medium amount of serosanguineous drainage noted. The wound margin is distinct with the outline attached to the wound base. There is medium (34-66%) red granulation within the wound bed. There is a medium (34-66%) amount of necrotic tissue within the wound bed including Adherent Slough. Assessment Active Problems ICD-10 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sed Morbid (severe) obesity due to excess calories Procedures Wound #1 Pre-procedure diagnosis of Wound #1 is a Lesion located on the Left,Anterior Lower Leg . There was a Excisional Skin/Subcutaneous Tissue Debridement with a total area of 29.6 sq cm performed by Duanne Guess, MD. With the following instrument(s): Curette to remove Non-Viable tissue/material. Material removed includes Subcutaneous Tissue and Slough and after achieving pain control using Lidocaine 5% topical ointment. No specimens were taken. A time out was conducted at 15:44, prior to the start of the procedure. A Minimum amount of bleeding was controlled with Pressure. The procedure was tolerated well with a pain level of 0 throughout and a pain level of 0 following the procedure. Post Debridement Measurements: 7.4cm length x 4cm width x 0.4cm depth; 9.299cm^3 volume. Character of Wound/Ulcer Post Debridement is improved. Post procedure Diagnosis Wound #1: Same as Pre-Procedure Wound #2  Pre-procedure diagnosis of Wound #2 is a Lesion located on the Left,Lateral Knee . There was a Excisional Skin/Subcutaneous Tissue Debridement with a total area of 1.33 sq cm performed by Duanne Guess, MD. With the following instrument(s): Curette to remove Non-Viable tissue/material. Material removed includes Subcutaneous Tissue and Slough and after achieving pain control using Lidocaine 5% topical ointment. No specimens were taken. A time out  was conducted at 15:44, prior to the start of the procedure. A Minimum amount of bleeding was controlled with Pressure. The procedure was tolerated well with a pain level of 0 throughout and a pain level of 0 following the procedure. Post Debridement Measurements: 1.9cm length x 0.7cm width x 0.1cm depth; 0.104cm^3 volume. Character of Wound/Ulcer Post Debridement is improved. Post procedure Diagnosis Wound #2: Same as Pre-Procedure Wound #3 Pre-procedure diagnosis of Wound #3 is a Lesion located on the Left,Lateral Lower Leg . There was a Excisional Skin/Subcutaneous Tissue Debridement with a total area of 5.4 sq cm performed by Duanne Guess, MD. With the following instrument(s): Curette to remove Non-Viable tissue/material. Material removed includes Subcutaneous Tissue and Slough and after achieving pain control using Lidocaine 5% topical ointment. No specimens were taken. A time out was conducted at 15:44, prior to the start of the procedure. A Minimum amount of bleeding was controlled with Pressure. The procedure was tolerated well with a pain level of 0 throughout and a pain level of 0 following the procedure. Post Debridement Measurements: 3.6cm length x 1.5cm width x 0.4cm depth; 1.696cm^3 volume. Character of Wound/Ulcer Post Debridement is improved. Post procedure Diagnosis Wound #3: Same as Pre-Procedure Plan Follow-up Appointments: Return Appointment in 1 week. - Dr Lady Gary - room 3 - 9/15 at 3:15 PM Anesthetic: Wound #1 Left,Anterior Lower Leg: (In clinic) Topical Lidocaine 5% applied to wound bed Wound #2 Left,Lateral Knee: (In clinic) Topical Lidocaine 5% applied to wound bed Wound #3 Left,Lateral Lower Leg: (In clinic) Topical Lidocaine 5% applied to wound bed Bathing/ Shower/ Hygiene: May shower with protection but do not get wound dressing(s) wet. - Please do not get wraps on left leg wet-use a cast protector to cover leg when bathing Edema Control - Lymphedema / SCD /  Other: Elevate legs to the level of the heart or above for 30 minutes daily and/or when sitting, a frequency of: Avoid standing for long periods of time. The following medication(s) was prescribed: lidocaine topical 5 % ointment ointment topical was prescribed at facility WOUND #1: - Lower Leg Wound Laterality: Left, Anterior Cleanser: Soap and Water 1 x Per Week/30 Days Discharge Instructions: May shower and wash wound with dial antibacterial soap and water prior to dressing change. Cleanser: Wound Cleanser (Generic) 1 x Per Week/30 Days Discharge Instructions: Cleanse the wound with wound cleanser prior to applying a clean dressing using gauze sponges, not tissue or cotton balls. Prim Dressing: Santyl Ointment (Generic) 1 x Per Week/30 Days ary Discharge Instructions: Apply nickel thick amount to wound bed as instructed Secondary Dressing: ABD Pad, 8x10 1 x Per Week/30 Days Discharge Instructions: Apply over primary dressing as directed. Secondary Dressing: Woven Gauze Sponge, Non-Sterile 4x4 in 1 x Per Week/30 Days Discharge Instructions: Apply over primary dressing as directed. Secured With: Paper T ape, 2x10 (in/yd) 1 x Per Week/30 Days Discharge Instructions: Secure dressing with tape as directed. Com pression Wrap: ThreePress (3 layer compression wrap) 1 x Per Week/30 Days Discharge Instructions: Apply three layer compression as directed. Com pression Wrap: Unnaboot w/Calamine, 4x10 (in/yd) 1 x Per Week/30  Days Discharge Instructions: Apply Unnaboot only below the knee, Com pression Wrap: Netting Tubular 1 x Per Week/30 Days Discharge Instructions: on left leg WOUND #2: - Knee Wound Laterality: Left, Lateral Cleanser: Soap and Water 1 x Per Week/30 Days Discharge Instructions: May shower and wash wound with dial antibacterial soap and water prior to dressing change. Cleanser: Wound Cleanser (Generic) 1 x Per Week/30 Days Discharge Instructions: Cleanse the wound with wound cleanser  prior to applying a clean dressing using gauze sponges, not tissue or cotton balls. Prim Dressing: Santyl Ointment (Generic) 1 x Per Week/30 Days ary Discharge Instructions: Apply nickel thick amount to wound bed as instructed Secondary Dressing: ABD Pad, 8x10 1 x Per Week/30 Days Discharge Instructions: Apply over primary dressing as directed. Secondary Dressing: Woven Gauze Sponge, Non-Sterile 4x4 in 1 x Per Week/30 Days Discharge Instructions: Apply over primary dressing as directed. Secured With: Paper T ape, 2x10 (in/yd) (Generic) 1 x Per Week/30 Days Discharge Instructions: Secure dressing with tape as directed. Com pression Wrap: Netting Tubular 1 x Per Week/30 Days Discharge Instructions: on left leg WOUND #3: - Lower Leg Wound Laterality: Left, Lateral Cleanser: Soap and Water 1 x Per Day/30 Days Discharge Instructions: May shower and wash wound with dial antibacterial soap and water prior to dressing change. Cleanser: Wound Cleanser (Generic) 1 x Per Day/30 Days Discharge Instructions: Cleanse the wound with wound cleanser prior to applying a clean dressing using gauze sponges, not tissue or cotton balls. Prim Dressing: Santyl Ointment (Generic) 1 x Per Day/30 Days ary Discharge Instructions: Apply nickel thick amount to wound bed as instructed Secondary Dressing: Woven Gauze Sponge, Non-Sterile 4x4 in (Generic) 1 x Per Day/30 Days Discharge Instructions: Apply over primary dressing as directed. Secured With: Paper T ape, 2x10 (in/yd) (Generic) 1 x Per Day/30 Days Discharge Instructions: Secure dressing with tape as directed. Com pression Wrap: ThreePress (3 layer compression wrap) 1 x Per Day/30 Days Discharge Instructions: Apply three layer compression as directed. Com pression Wrap: Unnaboot w/Calamine, 4x10 (in/yd) 1 x Per Day/30 Days Discharge Instructions: Apply Unnaboot only below the knee. 02/08/2022: His wounds are little bit smaller today, but still have a fair amount of  slough as well as some nonviable subcutaneous tissue present. I used a curette to debride slough and subcutaneous tissue from his wounds. We will continue to use the Santyl for ongoing enzymatic debridement backs with saline moistened gauze. As his wrap slipped quite a bit this week, we will try using the first layer of Unna boot at the top to secure it better. Follow-up in 1 week. Electronic Signature(s) Signed: 02/08/2022 4:04:43 PM By: Duanne Guess MD FACS Entered By: Duanne Guess on 02/08/2022 16:04:43 -------------------------------------------------------------------------------- HxROS Details Patient Name: Date of Service: HO LLENDER, RO BERT 02/08/2022 3:00 PM Medical Record Number: [id number] Patient Account Number: [id number] Date of Birth/Sex: Treating RN: May 31, 1996 (26 y.o. [name] Primary Care Provider: Oralia Manis Other Clinician: Referring Provider: Treating Provider/Extender: Levi Aland in Treatment: 1 Information Obtained From Patient Immunizations Pneumococcal Vaccine: Received Pneumococcal Vaccination: No Implantable Devices None Family and Social History Former smoker; Alcohol Use: Rarely; Drug Use: No History; Caffeine Use: Moderate; Financial Concerns: No; Food, Clothing or Shelter Needs: No; Support System Lacking: No; Transportation Concerns: No Electronic Signature(s) Signed: 02/08/2022 4:12:06 PM By: Duanne Guess MD FACS Signed: 02/08/2022 5:49:01 PM By: Gelene Mink By: Duanne Guess on 02/08/2022 15:54:15 -------------------------------------------------------------------------------- SuperBill Details Patient Name: Date of Service: HO LLENDER, RO BERT 02/08/2022 Medical Record  Number: [id number] Patient Account Number: [id number] Date of Birth/Sex: Treating RN: Jun 10, 1995 (26 y.o. 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 Modifier: sed Quantity: 1 CPT4 Code: [id number] Description: 11045 - DEB SUBQ TISS EA ADDL 20CM ICD-10 Diagnosis Description L97.822 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 Modifier: sed Quantity: 1 Physician Procedures : CPT4 Code Description Modifier [id number] - WC PHYS LEVEL 4 - EST PT 25 ICD-10 Diagnosis Description 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d E66.01 Morbid (severe) obesity due to excess calories Quantity: 1 : [id number] 11042 - WC PHYS SUBQ TISS 20 SQ CM ICD-10 Diagnosis Description 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d Quantity: 1 : [id number] 11045 - WC PHYS SUBQ TISS EA ADDL 20 CM ICD-10 Diagnosis Description 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d Quantity: 1 Electronic Signature(s) Signed: 02/11/2022 9:32:52 AM By: Sloan Leiter Signed: 02/11/2022 9:44:05 AM By: Duanne Guess MD FACS Previous Signature: 02/08/2022 4:05:03 PM Version By: Duanne Guess MD FACS Entered By: Sloan Leiter on 02/11/2022 09:32:51

## 2022-02-18 ENCOUNTER — Encounter (HOSPITAL_BASED_OUTPATIENT_CLINIC_OR_DEPARTMENT_OTHER): Payer: 59 | Admitting: General Surgery

## 2022-02-18 DIAGNOSIS — L97822 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sed: Secondary | ICD-10-CM | POA: Diagnosis not present

## 2022-02-18 NOTE — Progress Notes (Signed)
[name], [name] ([id number]) Visit Report for 02/18/2022 Chief Complaint Document Details Patient Name: Date of Service: [name] 02/18/2022 3:15 PM Medical Record Number: [id number] Patient Account Number: [id number] Date of Birth/Sex: Treating RN: 18-Jul-1995 (26 y.o. M) Primary Care Provider: Oralia Manis Other Clinician: Referring Provider: Treating Provider/Extender: Levi Aland in Treatment: 3 Information Obtained from: Patient Chief Complaint Patient presents to the wound care center with open non-healing surgical wound(s) Electronic Signature(s) Signed: 02/18/2022 4:06:47 PM By: Duanne Guess MD FACS Entered By: Duanne Guess on 02/18/2022 16:06:47 -------------------------------------------------------------------------------- Debridement Details Patient Name: Date of Service: Kyle [name], [name] 02/18/2022 3:15 PM Medical Record Number: [id number] Patient Account Number: [id number] Date of Birth/Sex: Treating RN: 1995-11-11 (26 y.o. Dianna Limbo Primary Care Provider: Oralia Manis Other Clinician: Referring Provider: Treating Provider/Extender: Levi Aland in Treatment: 3 Debridement Performed for Assessment: Wound #1 Left,Anterior Lower Leg Performed By: Physician Duanne Guess, MD Debridement Type: Debridement Level of Consciousness (Pre-procedure): Awake and Alert Pre-procedure Verification/Time Out Yes - 15:46 Taken: Start Time: 15:46 Pain Control: Lidocaine 4% T opical Solution T Area Debrided (L x W): otal 6.7 (cm) x 3.5 (cm) = 23.45 (cm) Tissue and other material debrided: Non-Viable, Eschar Level: Non-Viable Tissue Debridement Description: Selective/Open Wound Instrument: Curette Bleeding: Minimum Hemostasis Achieved: Pressure End Time: 15:48 Procedural Pain: 0 Post Procedural Pain: 0 Response to Treatment: Procedure was tolerated well Level of Consciousness (Post-  Awake and Alert procedure): Post Debridement Measurements of Total Wound Length: (cm) 6.7 Width: (cm) 3.5 Depth: (cm) 0.3 Volume: (cm) 5.525 Character of Wound/Ulcer Post Debridement: Improved Post Procedure Diagnosis Same as Pre-procedure Notes Scribed for Dr Lady Gary by J.Scotton Electronic Signature(s) Signed: 02/18/2022 4:25:24 PM By: Duanne Guess MD FACS Signed: 02/18/2022 4:53:14 PM By: Karie Schwalbe RN Previous Signature: 02/18/2022 4:10:30 PM Version By: Duanne Guess MD FACS Entered By: Karie Schwalbe on 02/18/2022 16:22:09 -------------------------------------------------------------------------------- Debridement Details Patient Name: Date of Service: [name], [name] 02/18/2022 3:15 PM Medical Record Number: [id number] Patient Account Number: [id number] Date of Birth/Sex: Treating RN: [birth year] (26 y.o. Dianna Limbo Primary Care Provider: Oralia Manis Other Clinician: Referring Provider: Treating Provider/Extender: Levi Aland in Treatment: 3 Debridement Performed for Assessment: Wound #2 Left,Lateral Knee Performed By: Physician Duanne Guess, MD Debridement Type: Debridement Level of Consciousness (Pre-procedure): Awake and Alert Pre-procedure Verification/Time Out Yes - 15:46 Taken: Start Time: 15:46 Pain Control: Lidocaine 4% T opical Solution T Area Debrided (L x W): otal 0.5 (cm) x 0.7 (cm) = 0.35 (cm) Tissue and other material debrided: Non-Viable, Eschar Level: Non-Viable Tissue Debridement Description: Selective/Open Wound Instrument: Curette Bleeding: Minimum Hemostasis Achieved: Pressure End Time: 15:48 Procedural Pain: 0 Post Procedural Pain: 0 Response to Treatment: Procedure was tolerated well Level of Consciousness (Post- Awake and Alert procedure): Post Debridement Measurements of Total Wound Length: (cm) 0.5 Width: (cm) 0.7 Depth: (cm) 0.1 Volume: (cm) 0.027 Character of  Wound/Ulcer Post Debridement: Improved Post Procedure Diagnosis Same as Pre-procedure Notes Scribed for Dr Lady Gary by J.Scotton Electronic Signature(s) Signed: 02/18/2022 4:25:24 PM By: Duanne Guess MD FACS Signed: 02/18/2022 4:53:14 PM By: Karie Schwalbe RN Previous Signature: 02/18/2022 4:10:30 PM Version By: Duanne Guess MD FACS Entered By: Karie Schwalbe on 02/18/2022 16:22:30 -------------------------------------------------------------------------------- Debridement Details Patient Name: Date of Service: Kyle [name], [name] 02/18/2022 3:15 PM Medical Record Number: [id number] Patient Account Number: [id number] Date of Birth/Sex: Treating RN: [birth year] (26 y.o. Dianna Limbo Primary Care Provider: Oralia Manis Other Clinician: Referring Provider: Treating  Provider/Extender: Levi Aland in Treatment: 3 Debridement Performed for Assessment: Wound #3 Left,Lateral Lower Leg Performed By: Physician Duanne Guess, MD Debridement Type: Debridement Level of Consciousness (Pre-procedure): Awake and Alert Pre-procedure Verification/Time Out Yes - 15:46 Taken: Start Time: 15:46 Pain Control: Lidocaine 4% T opical Solution T Area Debrided (L x W): otal 2.5 (cm) x 1.5 (cm) = 3.75 (cm) Tissue and other material debrided: Non-Viable, Eschar Level: Non-Viable Tissue Debridement Description: Selective/Open Wound Instrument: Curette Bleeding: Minimum Hemostasis Achieved: Pressure End Time: 15:48 Procedural Pain: 0 Post Procedural Pain: 0 Response to Treatment: Procedure was tolerated well Level of Consciousness (Post- Awake and Alert procedure): Post Debridement Measurements of Total Wound Length: (cm) 2.5 Width: (cm) 1.5 Depth: (cm) 0.1 Volume: (cm) 0.295 Character of Wound/Ulcer Post Debridement: Improved Post Procedure Diagnosis Same as Pre-procedure Notes Scribed for Dr Lady Gary by J.Scotton Electronic Signature(s) Signed:  02/18/2022 4:25:24 PM By: Duanne Guess MD FACS Signed: 02/18/2022 4:53:14 PM By: Karie Schwalbe RN Previous Signature: 02/18/2022 4:10:30 PM Version By: Duanne Guess MD FACS Entered By: Karie Schwalbe on 02/18/2022 16:22:49 -------------------------------------------------------------------------------- HPI Details Patient Name: Date of Service: [name], [name] 02/18/2022 3:15 PM Medical Record Number: [id number] Patient Account Number: [id number] Date of Birth/Sex: Treating RN: [birth year] (26 y.o. 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 He was in [place] and apparently was hospitalized for dehydration and possible sepsis. We do not have any records from his hospitalization, but at some point he underwent surgery and has 3 open wounds. He reports that they did this to "take cultures." Again, no records are available to know if this was done, what those results were, or what antibiotics he was given. He is now here today for further evaluation and management of 3 left lower extremity wounds. He has a wound just caudal to his knee on the lateral aspect of his leg, another on his lateral calf, and a third on his anterior tibial surface. All of them have slough. The anterior tibial surface wound is the largest and also has some necrotic fat present. There is periwound erythema and tenderness. The placement of the incisions does not correlate with 4 compartment fasciotomy so I am not entirely sure what the thought process of the surgeons in [place] was or what specifically they were operating for. Hopefully we will receive those documents before his next visit. 02/08/2022: We have not received any information from [place].  His wounds are little bit smaller today, but still have a fair amount of slough as well as some nonviable subcutaneous tissue present. The culture that I took last week returned positive only for very low levels of methicillin sensitive Staph aureus, nonpathologic levels. 1523: The left lateral knee wound is healed. There is just a bit of eschar on the surface that once removed, revealed complete epithelialization. The more proximal and lateral lower leg wound is quite a bit smaller with just a little bit of slough and some good robust looking granulation tissue. The largest of the wounds on the anterior tibia has a fair amount of slough buildup but has contracted quite a bit since our last visit. Electronic Signature(s) Signed: 02/18/2022 4:07:51 PM By: Duanne Guess MD FACS Entered By: Duanne Guess on 02/18/2022 16:07:51 -------------------------------------------------------------------------------- Physical Exam Details Patient Name: Date of Service:  [name], [name] 02/18/2022 3:15 PM Medical Record Number: [id number] Patient Account Number: [id number] Date of Birth/Sex: Treating RN: February 23, 1996 (26 y.o. M) Primary Care Provider: Tera Partridge Other Clinician: Referring Provider: Treating Provider/Extender: Edwena Blow in Treatment: 3 Constitutional No acute distress.Marland Kitchen Respiratory Normal work of breathing on room air.. Notes 1523: The left lateral knee wound is healed. There is just a bit of eschar on the surface that once removed, revealed complete epithelialization. The more proximal and lateral lower leg wound is quite a bit smaller with just a little bit of slough and some good robust looking granulation tissue. The largest of the wounds on the anterior tibia has a fair amount of slough buildup but has contracted quite a bit since our last visit. Electronic Signature(s) Signed: 02/18/2022 4:08:57 PM By: [name] MD FACS Entered By:  [name] on 02/18/2022 16:08:57 -------------------------------------------------------------------------------- Physician Orders Details Patient Name: Date of Service: [name], [name] 02/18/2022 3:15 PM Medical Record Number: [id number] Patient Account Number: [id number] Date of Birth/Sex: Treating RN: [birth year] (26 y.o. Waldron Session Primary Care Provider: Tera Partridge Other Clinician: Referring Provider: Treating Provider/Extender: Edwena Blow in Treatment: 3 Verbal / Phone Orders: No Diagnosis Coding ICD-10 Coding Code Description [phone] 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sed E66.01 Morbid (severe) obesity due to excess calories Follow-up Appointments ppointment in 1 week. - Dr [name] - Room 3 - Friday 02/25/22 at 3:15pm Return A Anesthetic Wound #1 Left,Anterior Lower Leg (In clinic) Topical Lidocaine 5% applied to wound bed - In Clinic Wound #3 Left,Lateral Lower Leg (In clinic) Topical Lidocaine 5% applied to wound bed Bathing/ Shower/ Hygiene May shower with protection but do not get wound dressing(s) wet. - Please do not get wraps on left leg wet-use a cast protector to cover leg when bathing Edema Control - Lymphedema / SCD / Other Elevate legs to the level of the heart or above for 30 minutes daily and/or when sitting, a frequency of: Avoid standing for long periods of time. Wound Treatment Wound #1 - Lower Leg Wound Laterality: Left, Anterior Cleanser: Soap and Water 1 x Per Week/30 Days Discharge Instructions: May shower and wash wound with dial antibacterial soap and water prior to dressing change. Cleanser: Wound Cleanser (Generic) 1 x Per Week/30 Days Discharge Instructions: Cleanse the wound with wound cleanser prior to applying a clean dressing using gauze sponges, not tissue or cotton balls. Prim Dressing: Santyl Ointment (Generic) 1 x Per Week/30 Days ary Discharge  Instructions: Apply nickel thick amount to wound bed as instructed Secondary Dressing: ABD Pad, 8x10 1 x Per Week/30 Days Discharge Instructions: Apply over primary dressing as directed. Secondary Dressing: Woven Gauze Sponge, Non-Sterile 4x4 in 1 x Per Week/30 Days Discharge Instructions: Apply over primary dressing as directed. Secured With: Paper Tape, 2x10 (in/yd) 1 x Per Week/30 Days Discharge Instructions: Secure dressing with tape as directed. Compression Wrap: ThreePress (3 layer compression wrap) 1 x Per Week/30 Days Discharge Instructions: Apply three layer compression as directed. Compression Wrap: Unnaboot w/Calamine, 4x10 (in/yd) 1 x Per Week/30 Days Discharge Instructions: Apply Unnaboot only below the knee, Compression Wrap: Netting Tubular 1 x Per Week/30 Days Discharge Instructions: on left leg Wound #3 - Lower Leg Wound Laterality: Left, Lateral Cleanser: Soap and Water 1 x Per Day/30 Days Discharge Instructions: May shower and wash wound with dial antibacterial soap and water prior to dressing change. Cleanser: Wound Cleanser (Generic) 1  x Per Day/30 Days Discharge Instructions: Cleanse the wound with wound cleanser prior to applying a clean dressing using gauze sponges, not tissue or cotton balls. Prim Dressing: KerraCel Ag Gelling Fiber Dressing, 2x2 in (silver alginate) 1 x Per Day/30 Days ary Discharge Instructions: Apply silver alginate to wound bed as instructed Secondary Dressing: Woven Gauze Sponge, Non-Sterile 4x4 in (Generic) 1 x Per Day/30 Days Discharge Instructions: Apply over primary dressing as directed. Secured With: Paper Tape, 2x10 (in/yd) (Generic) 1 x Per Day/30 Days Discharge Instructions: Secure dressing with tape as directed. Compression Wrap: ThreePress (3 layer compression wrap) 1 x Per Day/30 Days Discharge Instructions: Apply three layer compression as directed. Compression Wrap: Unnaboot w/Calamine, 4x10 (in/yd) 1 x Per Day/30 Days Discharge  Instructions: Apply Unnaboot only below the knee. Electronic Signature(s) Signed: 02/18/2022 4:10:30 PM By: [name] MD FACS Entered By: [name] on 02/18/2022 16:09:15 -------------------------------------------------------------------------------- Problem List Details Patient Name: Date of Service: [name], [name] 02/18/2022 3:15 PM Medical Record Number: [id number] Patient Account Number: [id number] Date of Birth/Sex: Treating RN: 11/24/1995 (26 y.o. M) Primary Care Provider: Tera Partridge Other Clinician: Referring Provider: Treating Provider/Extender: Edwena Blow in Treatment: 3 Active Problems ICD-10 Encounter Code Description Active Date MDM Diagnosis [phone] 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sed8/25/2023 No Yes E66.01 Morbid (severe) obesity due to excess calories 01/28/2022 No Yes Inactive Problems Resolved Problems Electronic Signature(s) Signed: 02/18/2022 4:06:31 PM By: [name] MD FACS Entered By: [name] on 02/18/2022 16:06:31 -------------------------------------------------------------------------------- Progress Note Details Patient Name: Date of Service: [name], [name] 02/18/2022 3:15 PM Medical Record Number: [id number] Patient Account Number: [id number] Date of Birth/Sex: Treating RN: [birth year] (26 y.o. 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 He was in [place] and apparently was hospitalized for dehydration and possible sepsis. We do not have any records  from his hospitalization, but at some point he underwent surgery and has 3 open wounds. He reports that they did this to "take cultures." Again, no records are available to know if this was done, what those results were, or what antibiotics he was given. He is now here today for further evaluation and management of 3 left lower extremity wounds. He has a wound just caudal to his knee on the lateral aspect of his leg, another on his lateral calf, and a third on his anterior tibial surface. All of them have slough. The anterior tibial surface wound is the largest and also has some necrotic fat present. There is periwound erythema and tenderness. The placement of the incisions does not correlate with 4 compartment fasciotomy so I am not entirely sure what the thought process of the surgeons in [place] was or what specifically they were operating for. Hopefully we will receive those documents before his next visit. 02/08/2022: We have not received any information from [place]. His wounds are little bit smaller today, but still have a fair amount of slough as well as some nonviable subcutaneous tissue present. The culture that I took last week returned positive only for very low levels of methicillin sensitive Staph aureus, nonpathologic levels. 1523: The left lateral knee wound is  healed. There is just a bit of eschar on the surface that once removed, revealed complete epithelialization. The more proximal and lateral lower leg wound is quite a bit smaller with just a little bit of slough and some good robust looking granulation tissue. The largest of the wounds on the anterior tibia has a fair amount of slough buildup but has contracted quite a bit since our last visit. Patient History Information obtained from Patient. Social History Former smoker, Alcohol Use - Rarely, Drug Use - No History, Caffeine Use - Moderate. Objective Constitutional No acute distress.Marland Kitchen Respiratory Normal work of  breathing on room air.. General Notes: [id number]: The left lateral knee wound is healed. There is just a bit of eschar on the surface that once removed, revealed complete epithelialization. The more proximal and lateral lower leg wound is quite a bit smaller with just a little bit of slough and some good robust looking granulation tissue. The largest of the wounds on the anterior tibia has a fair amount of slough buildup but has contracted quite a bit since our last visit. Integumentary (Hair, Skin) Wound #1 status is Open. Original cause of wound was Surgical Injury. The date acquired was: 01/04/2022. The wound has been in treatment 3 weeks. The wound is located on the Left,Anterior Lower Leg. The wound measures 6.7cm length x 3.5cm width x 0.3cm depth; 18.418cm^2 area and 5.525cm^3 volume. There is a medium amount of serosanguineous drainage noted. The wound margin is distinct with the outline attached to the wound base. There is medium (34-66%) red, hyper - granulation within the wound bed. There is a medium (34-66%) amount of necrotic tissue within the wound bed including Adherent Slough. Wound #2 status is Healed - Epithelialized. Original cause of wound was Surgical Injury. The date acquired was: 01/04/2022. The wound has been in treatment 3 weeks. The wound is located on the Left,Lateral Knee. The wound measures 0cm length x 0cm width x 0cm depth; 0cm^2 area and 0cm^3 volume. There is no tunneling or undermining noted. There is a none present amount of drainage noted. The wound margin is distinct with the outline attached to the wound base. There is no granulation within the wound bed. There is no necrotic tissue within the wound bed. Wound #3 status is Open. Original cause of wound was Surgical Injury. The date acquired was: 01/04/2022. The wound has been in treatment 3 weeks. The wound is located on the Left,Lateral Lower Leg. The wound measures 2.5cm length x 1.5cm width x 0.1cm depth; 2.945cm^2 area  and 0.295cm^3 volume. There is Fat Layer (Subcutaneous Tissue) exposed. There is no tunneling or undermining noted. There is a medium amount of serosanguineous drainage noted. The wound margin is distinct with the outline attached to the wound base. There is small (1-33%) red granulation within the wound bed. There is a large (67-100%) amount of necrotic tissue within the wound bed including Eschar and Adherent Slough. Assessment Active Problems ICD-10 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sed Morbid (severe) obesity due to excess calories Procedures Wound #1 Pre-procedure diagnosis of Wound #1 is a Lesion located on the Left,Anterior Lower Leg . There was a Selective/Open Wound Non-Viable Tissue Debridement with a total area of 23.45 sq cm performed by [name], MD. With the following instrument(s): Curette to remove Non-Viable tissue/material. Material removed includes Eschar after achieving pain control using Lidocaine 4% T opical Solution. No specimens were taken. A time out was conducted at 15:46, prior  to the start of the procedure. A Minimum amount of bleeding was controlled with Pressure. The procedure was tolerated well with a pain level of 0 throughout and a pain level of 0 following the procedure. Post Debridement Measurements: 6.7cm length x 3.5cm width x 0.3cm depth; 5.525cm^3 volume. Character of Wound/Ulcer Post Debridement is improved. Post procedure Diagnosis Wound #1: Same as Pre-Procedure General Notes: Scribed for Dr [name] by J.Scotton. Pre-procedure diagnosis of Wound #1 is a Lesion located on the Left,Anterior Lower Leg . There was a Three Layer Compression Therapy Procedure by [name], RN. Post procedure Diagnosis Wound #1: Same as Pre-Procedure Wound #2 Pre-procedure diagnosis of Wound #2 is a Lesion located on the Left,Lateral Knee . There was a Selective/Open Wound Non-Viable Tissue Debridement with a total  area of 0.35 sq cm performed by [name], MD. With the following instrument(s): Curette to remove Non-Viable tissue/material. Material removed includes Eschar after achieving pain control using Lidocaine 4% Topical Solution. No specimens were taken. A time out was conducted at 15:46, prior to the start of the procedure. A Minimum amount of bleeding was controlled with Pressure. The procedure was tolerated well with a pain level of 0 throughout and a pain level of 0 following the procedure. Post Debridement Measurements: 0.5cm length x 0.7cm width x 0.1cm depth; 0.027cm^3 volume. Character of Wound/Ulcer Post Debridement is improved. Post procedure Diagnosis Wound #2: Same as Pre-Procedure General Notes: Scribed for Dr [name] by J.Scotton. Pre-procedure diagnosis of Wound #2 is a Lesion located on the Left,Lateral Knee . There was a Three Layer Compression Therapy Procedure by [name], RN. Post procedure Diagnosis Wound #2: Same as Pre-Procedure Wound #3 Pre-procedure diagnosis of Wound #3 is a Lesion located on the Left,Lateral Lower Leg . There was a Selective/Open Wound Non-Viable Tissue Debridement with a total area of 3.75 sq cm performed by [name], MD. With the following instrument(s): Curette to remove Non-Viable tissue/material. Material removed includes Eschar after achieving pain control using Lidocaine 4% T opical Solution. No specimens were taken. A time out was conducted at 15:46, prior to the start of the procedure. A Minimum amount of bleeding was controlled with Pressure. The procedure was tolerated well with a pain level of 0 throughout and a pain level of 0 following the procedure. Post Debridement Measurements: 2.5cm length x 1.5cm width x 0.1cm depth; 0.295cm^3 volume. Character of Wound/Ulcer Post Debridement is improved. Post procedure Diagnosis Wound #3: Same as Pre-Procedure General Notes: Scribed for Dr [name] by J.Scotton. Pre-procedure diagnosis  of Wound #3 is a Lesion located on the Left,Lateral Lower Leg . There was a Three Layer Compression Therapy Procedure by [name], RN. Post procedure Diagnosis Wound #3: Same as Pre-Procedure Plan Follow-up Appointments: Return Appointment in 1 week. - Dr [name] - Room 3 - Friday 02/25/22 at 3:15pm Anesthetic: Wound #1 Left,Anterior Lower Leg: (In clinic) Topical Lidocaine 5% applied to wound bed - In Clinic Wound #3 Left,Lateral Lower Leg: (In clinic) Topical Lidocaine 5% applied to wound bed Bathing/ Shower/ Hygiene: May shower with protection but do not get wound dressing(s) wet. - Please do not get wraps on left leg wet-use a cast protector to cover leg when bathing Edema Control - Lymphedema / SCD / Other: Elevate legs to the level of the heart or above for 30 minutes daily and/or when sitting, a frequency of: Avoid standing for long periods of time. WOUND #1: - Lower Leg Wound Laterality: Left, Anterior Cleanser: Soap and Water 1 x Per  Week/30 Days Discharge Instructions: May shower and wash wound with dial antibacterial soap and water prior to dressing change. Cleanser: Wound Cleanser (Generic) 1 x Per Week/30 Days Discharge Instructions: Cleanse the wound with wound cleanser prior to applying a clean dressing using gauze sponges, not tissue or cotton balls. Prim Dressing: Santyl Ointment (Generic) 1 x Per Week/30 Days ary Discharge Instructions: Apply nickel thick amount to wound bed as instructed Secondary Dressing: ABD Pad, 8x10 1 x Per Week/30 Days Discharge Instructions: Apply over primary dressing as directed. Secondary Dressing: Woven Gauze Sponge, Non-Sterile 4x4 in 1 x Per Week/30 Days Discharge Instructions: Apply over primary dressing as directed. Secured With: Paper T ape, 2x10 (in/yd) 1 x Per Week/30 Days Discharge Instructions: Secure dressing with tape as directed. Com pression Wrap: ThreePress (3 layer compression wrap) 1 x Per Week/30 Days Discharge  Instructions: Apply three layer compression as directed. Com pression Wrap: Unnaboot w/Calamine, 4x10 (in/yd) 1 x Per Week/30 Days Discharge Instructions: Apply Unnaboot only below the knee, Com pression Wrap: Netting Tubular 1 x Per Week/30 Days Discharge Instructions: on left leg WOUND #3: - Lower Leg Wound Laterality: Left, Lateral Cleanser: Soap and Water 1 x Per Day/30 Days Discharge Instructions: May shower and wash wound with dial antibacterial soap and water prior to dressing change. Cleanser: Wound Cleanser (Generic) 1 x Per Day/30 Days Discharge Instructions: Cleanse the wound with wound cleanser prior to applying a clean dressing using gauze sponges, not tissue or cotton balls. Prim Dressing: KerraCel Ag Gelling Fiber Dressing, 2x2 in (silver alginate) 1 x Per Day/30 Days ary Discharge Instructions: Apply silver alginate to wound bed as instructed Secondary Dressing: Woven Gauze Sponge, Non-Sterile 4x4 in (Generic) 1 x Per Day/30 Days Discharge Instructions: Apply over primary dressing as directed. Secured With: Paper T ape, 2x10 (in/yd) (Generic) 1 x Per Day/30 Days Discharge Instructions: Secure dressing with tape as directed. Com pression Wrap: ThreePress (3 layer compression wrap) 1 x Per Day/30 Days Discharge Instructions: Apply three layer compression as directed. Com pression Wrap: Unnaboot w/Calamine, 4x10 (in/yd) 1 x Per Day/30 Days Discharge Instructions: Apply Unnaboot only below the knee. 1523: The left lateral knee wound is healed. There is just a bit of eschar on the surface that once removed, revealed complete epithelialization. The more proximal and lateral lower leg wound is quite a bit smaller with just a little bit of slough and some good robust looking granulation tissue. The largest of the wounds on the anterior tibia has a fair amount of slough buildup but has contracted quite a bit since our last visit. Used a curette to debride the eschar of the left lateral  knee wound, slough off of both of the lower leg wounds. We will continue to use Santyl with saline moistened gauze to the largest anterior tibial wound. We will use silver alginate to the more lateral wound. Continue 3 layer compression. Follow-up in 1 week. Electronic Signature(s) Signed: 02/18/2022 4:25:24 PM By: [name] MD FACS Signed: 02/18/2022 4:53:14 PM By: [name] RN Previous Signature: 02/18/2022 4:09:55 PM Version By: [name] MD FACS Entered By: [name] on 02/18/2022 16:23:07 -------------------------------------------------------------------------------- HxROS Details Patient Name: Date of Service: [name], [name] 02/18/2022 3:15 PM Medical Record Number: [id number] Patient Account Number: [id number] Date of Birth/Sex: Treating RN: 01/15/1996 (26 y.o. M) Primary Care Provider: Tera Partridge Other Clinician: Referring Provider: Treating Provider/Extender: Edwena Blow in Treatment: 3 Information Obtained From Patient Immunizations Pneumococcal Vaccine: Received Pneumococcal Vaccination: No Implantable Devices  None Family and Social History Former smoker; Alcohol Use: Rarely; Drug Use: No History; Caffeine Use: Moderate; Financial Concerns: No; Food, Clothing or Shelter Needs: No; Support System Lacking: No; Transportation Concerns: No Electronic Signature(s) Signed: 02/18/2022 4:10:30 PM By: Duanne Guess MD FACS Entered By: Duanne Guess on 02/18/2022 16:08:37 -------------------------------------------------------------------------------- SuperBill Details Patient Name: Date of Service: [name], [name] 02/18/2022 Medical Record Number: [id number] Patient Account Number: [id number] Date of Birth/Sex: Treating RN: [birth year] (26 y.o. 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 Morbid (severe) obesity due to excess calories Facility Procedures CPT4 Code: [id number] Description: [phone] - DEBRIDE WOUND 1ST 20 SQ CM OR < ICD-10 Diagnosis Description 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d Modifier: Quantity: 1 CPT4 Code: [id number] Description: 97598 - DEBRIDE WOUND EA ADDL 20 SQ CM ICD-10 Diagnosis Description 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d Modifier: Quantity: 1 Physician Procedures : CPT4 Code Description Modifier [id number] - WC PHYS LEVEL 4 - EST PT ICD-10 Diagnosis Description 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d E66.01 Morbid (severe) obesity due to excess calories Quantity: 1 : [id number] 97597 - WC PHYS DEBR WO ANESTH 20 SQ CM ICD-10 Diagnosis Description 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d Quantity: 1 : [id number] 97598 - WC PHYS DEBR WO ANESTH EA ADD 20 CM ICD-10 Diagnosis Description 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d Quantity: 1 Electronic Signature(s) Signed: 02/18/2022 4:10:09 PM By: Duanne Guess MD FACS Entered By: Duanne Guess on 02/18/2022 16:10:08

## 2022-02-18 NOTE — Progress Notes (Signed)
ZACKARY, MCKEONE ([id number]) Visit Report for 02/18/2022 Arrival Information Details Patient Name: Date of Service: [name] 02/18/2022 3:15 PM Medical Record Number: [id number] Patient Account Number: [id number] Date of Birth/Sex: Treating RN: 1996/01/14 (26 y.o. Dianna Limbo Primary Care Dazaria Macneill: Oralia Manis Other Clinician: Referring Jasaun Carn: Treating Kyllie Pettijohn/Extender: Levi Aland in Treatment: 3 Visit Information History Since Last Visit Added or deleted any medications: No Patient Arrived: Gilmer Mor Any new allergies or adverse reactions: No Arrival Time: 15:45 Had a fall or experienced change in No Accompanied By: self activities of daily living that may affect Transfer Assistance: None risk of falls: Patient Identification Verified: Yes Signs or symptoms of abuse/neglect since last visito No Hospitalized since last visit: No Implantable device outside of the clinic excluding No cellular tissue based products placed in the center since last visit: Has Dressing in Place as Prescribed: Yes Pain Present Now: No Electronic Signature(s) Signed: 02/18/2022 4:53:14 PM By: Karie Schwalbe RN Entered By: Karie Schwalbe on 02/18/2022 15:45:41 -------------------------------------------------------------------------------- Compression Therapy Details Patient Name: Date of Service: [name], RO BERT 02/18/2022 3:15 PM Medical Record Number: [id number] Patient Account Number: [id number] Date of Birth/Sex: Treating RN: September 05, 1995 (26 y.o. Dianna Limbo Primary Care Kyung Muto: Oralia Manis Other Clinician: Referring Galileo Colello: Treating Jamani Eley/Extender: Levi Aland in Treatment: 3 Compression Therapy Performed for Wound Assessment: Wound #1 Left,Anterior Lower Leg Performed By: Clinician Karie Schwalbe, RN Compression Type: Three Layer Post Procedure Diagnosis Same as Pre-procedure Electronic  Signature(s) Signed: 02/18/2022 4:53:14 PM By: Karie Schwalbe RN Entered By: Karie Schwalbe on 02/18/2022 15:57:35 -------------------------------------------------------------------------------- Compression Therapy Details Patient Name: Date of Service: [facility], RO BERT 02/18/2022 3:15 PM Medical Record Number: [id number] Patient Account Number: [id number] Date of Birth/Sex: Treating RN: 1995/11/27 (26 y.o. Dianna Limbo Primary Care Siriyah Ambrosius: Oralia Manis Other Clinician: Referring Phyllicia Dudek: Treating Danton Palmateer/Extender: Levi Aland in Treatment: 3 Compression Therapy Performed for Wound Assessment: Wound #2 Left,Lateral Knee Performed By: Clinician Karie Schwalbe, RN Compression Type: Three Layer Post Procedure Diagnosis Same as Pre-procedure Electronic Signature(s) Signed: 02/18/2022 4:53:14 PM By: Karie Schwalbe RN Entered By: Karie Schwalbe on 02/18/2022 15:57:35 -------------------------------------------------------------------------------- Compression Therapy Details Patient Name: Date of Service: [facility], RO BERT 02/18/2022 3:15 PM Medical Record Number: [id number] Patient Account Number: [id number] Date of Birth/Sex: Treating RN: February 27, 1996 (26 y.o. Dianna Limbo Primary Care Mystic Labo: Oralia Manis Other Clinician: Referring Jael Kostick: Treating Jene Huq/Extender: Levi Aland in Treatment: 3 Compression Therapy Performed for Wound Assessment: Wound #3 Left,Lateral Lower Leg Performed By: Clinician Karie Schwalbe, RN Compression Type: Three Layer Post Procedure Diagnosis Same as Pre-procedure Electronic Signature(s) Signed: 02/18/2022 4:53:14 PM By: Karie Schwalbe RN Entered By: Karie Schwalbe on 02/18/2022 15:57:35 -------------------------------------------------------------------------------- Encounter Discharge Information Details Patient Name: Date of Service: [facility], RO  BERT 02/18/2022 3:15 PM Medical Record Number: [id number] Patient Account Number: [id number] Date of Birth/Sex: Treating RN: 1995-12-24 (26 y.o. Dianna Limbo Primary Care Roberts Bon: Oralia Manis Other Clinician: Referring Josslynn Mentzer: Treating Eyla Tallon/Extender: Levi Aland in Treatment: 3 Encounter Discharge Information Items Post Procedure Vitals Discharge Condition: Stable Temperature (F): 98.4 Ambulatory Status: Cane Pulse (bpm): 102 Discharge Destination: Home Respiratory Rate (breaths/min): 16 Transportation: Private Auto Blood Pressure (mmHg): 134/85 Accompanied By: girlfriend Schedule Follow-up Appointment: Yes Clinical Summary of Care: Patient Declined Electronic Signature(s) Signed: 02/18/2022 4:53:14 PM By: Karie Schwalbe RN Entered By: Karie Schwalbe on 02/18/2022 16:40:37 -------------------------------------------------------------------------------- Lower Extremity Assessment Details Patient Name: Date of Service: [name], RO  BERT 02/18/2022 3:15 PM Medical Record Number: [id number] Patient Account Number: [id number] Date of Birth/Sex: Treating RN: [birth year] (26 y.o. Dianna Limbo Primary Care Daine Croker: Oralia Manis Other Clinician: Referring Falyn Rubel: Treating Deborra Phegley/Extender: Levi Aland in Treatment: 3 Edema Assessment Assessed: Kyra Searles: No] Franne Forts: No] E[Left: dema] [Right: :] Calf Left: Right: Point of Measurement: From Medial Instep 48 cm Ankle Left: Right: Point of Measurement: From Medial Instep 28.5 cm Electronic Signature(s) Signed: 02/18/2022 4:53:14 PM By: Karie Schwalbe RN Entered By: Karie Schwalbe on 02/18/2022 16:37:40 -------------------------------------------------------------------------------- Multi Wound Chart Details Patient Name: Date of Service: [name], RO BERT 02/18/2022 3:15 PM Medical Record Number: [id number] Patient Account Number:  [id number] Date of Birth/Sex: Treating RN: 1995-10-20 (26 y.o. M) Primary Care Darion Milewski: Oralia Manis Other Clinician: Referring Emeree Mahler: Treating Asaf Elmquist/Extender: Levi Aland in Treatment: 3 Photos: Left, Anterior Lower Leg Left, Lateral Knee Left, Lateral Lower Leg Wound Location: Surgical Injury Surgical Injury Surgical Injury Wounding Event: Lesion Lesion Lesion Primary Etiology: 01/04/2022 01/04/2022 01/04/2022 Date Acquired: 3 3 3  Weeks of Treatment: Open Healed - Epithelialized Open Wound Status: No No No Wound Recurrence: 6.7x3.5x0.3 0x0x0 2.5x1.5x0.1 Measurements L x W x D (cm) 18.418 0 2.945 A (cm) : rea 5.525 0 0.295 Volume (cm) : 30.10% 100.00% 62.50% % Reduction in Area: 86.00% 100.00% 94.60% % Reduction in Volume: Full Thickness Without Exposed Full Thickness Without Exposed Full Thickness Without Exposed Classification: Support Structures Support Structures Support Structures Medium None Present Medium Exudate A mount: Serosanguineous N/A Serosanguineous Exudate Type: red, brown N/A red, brown Exudate Color: Distinct, outline attached Distinct, outline attached Distinct, outline attached Wound Margin: Medium (34-66%) None Present (0%) Small (1-33%) Granulation A mount: Red, Hyper-granulation N/A Red Granulation Quality: Medium (34-66%) None Present (0%) Large (67-100%) Necrotic A mount: Adherent Slough N/A Eschar, Adherent Slough Necrotic Tissue: Fascia: No Fascia: No Fat Layer (Subcutaneous Tissue): Yes Exposed Structures: Fat Layer (Subcutaneous Tissue): No Fat Layer (Subcutaneous Tissue): No Fascia: No Tendon: No Tendon: No Tendon: No Muscle: No Muscle: No Muscle: No Joint: No Joint: No Joint: No Bone: No Bone: No Bone: No Small (1-33%) Large (67-100%) Small (1-33%) Epithelialization: Debridement - Selective/Open Wound Debridement - Selective/Open Wound Debridement - Selective/Open  Wound Debridement: Pre-procedure Verification/Time Out 15:50 15:50 15:50 Taken: Lidocaine 4% Topical Solution Lidocaine 4% Topical Solution Lidocaine 4% Topical Solution Pain Control: Necrotic/Eschar Necrotic/Eschar Necrotic/Eschar Tissue Debrided: Non-Viable Tissue Non-Viable Tissue Non-Viable Tissue Level: 23.45 0.35 3.75 Debridement A (sq cm): rea Curette Curette Curette Instrument: Minimum Minimum Minimum Bleeding: Pressure Pressure Pressure Hemostasis A chieved: 0 0 0 Procedural Pain: 0 0 0 Post Procedural Pain: Procedure was tolerated well Procedure was tolerated well Procedure was tolerated well Debridement Treatment Response: 6.7x3.5x0.3 0.5x0.7x0.1 2.5x1.5x0.1 Post Debridement Measurements L x W x D (cm) 5.525 0.027 0.295 Post Debridement Volume: (cm) Compression Therapy Compression Therapy Compression Therapy Procedures Performed: Debridement Debridement Debridement Treatment Notes Electronic Signature(s) Signed: 02/18/2022 4:06:41 PM By: 02/20/2022 MD FACS Entered By: Duanne Guess on 02/18/2022 16:06:41 -------------------------------------------------------------------------------- Multi-Disciplinary Care Plan Details Patient Name: Date of Service: [facility], RO BERT 02/18/2022 3:15 PM Medical Record Number: 02/20/2022 Patient Account Number: [id number] Date of Birth/Sex: Treating RN: 06/22/1995 (26 y.o. 34 Primary Care Erique Kaser: Valma Cava Other Clinician: Referring Lashandra Arauz: Treating Raequan Vanschaick/Extender: Oralia Manis in Treatment: 3 Active Inactive Wound/Skin Impairment Nursing Diagnoses: Knowledge deficit related to ulceration/compromised skin integrity Goals: Patient/caregiver will verbalize understanding of skin care regimen Date Initiated: 01/28/2022 Target Resolution Date: 06/05/2022 Goal  Status: Active Interventions: Assess ulceration(s) every visit Treatment Activities: Skin care  regimen initiated : 01/28/2022 Notes: Electronic Signature(s) Signed: 02/18/2022 5:03:05 PM By: Tommie Ard RN Entered By: Tommie Ard on 02/18/2022 15:45:31 -------------------------------------------------------------------------------- Pain Assessment Details Patient Name: Date of Service: [name], RO BERT 02/18/2022 3:15 PM Medical Record Number: [id number] Patient Account Number: [id number] Date of Birth/Sex: Treating RN: 23-Oct-1995 (26 y.o. Dianna Limbo Primary Care Laureano Hetzer: Oralia Manis Other Clinician: Referring Galileo Colello: Treating Dakari Stabler/Extender: Levi Aland in Treatment: 3 Active Problems Location of Pain Severity and Description of Pain Patient Has Paino No Site Locations Pain Management and Medication Current Pain Management: Electronic Signature(s) Signed: 02/18/2022 4:53:14 PM By: Karie Schwalbe RN Entered By: Karie Schwalbe on 02/18/2022 16:37:20 -------------------------------------------------------------------------------- Patient/Caregiver Education Details Patient Name: Date of Service: [name], RO BERT 9/15/2023andnbsp3:15 PM Medical Record Number: [id number] Patient Account Number: [id number] Date of Birth/Gender: Treating RN: 03-08-1996 (26 y.o. Valma Cava Primary Care Physician: Oralia Manis Other Clinician: Referring Physician: Treating Physician/Extender: Levi Aland in Treatment: 3 Education Assessment Education Provided To: Patient Education Topics Provided Wound/Skin Impairment: Methods: Explain/Verbal Responses: Reinforcements needed, State content correctly Electronic Signature(s) Signed: 02/18/2022 5:03:05 PM By: Tommie Ard RN Entered By: Tommie Ard on 02/18/2022 15:45:47 -------------------------------------------------------------------------------- Wound Assessment Details Patient Name: Date of Service: [name], RO BERT 02/18/2022 3:15  PM Medical Record Number: [id number] Patient Account Number: [id number] Date of Birth/Sex: Treating RN: 06/02/1996 (26 y.o. Dianna Limbo Primary Care Jared Whorley: Oralia Manis Other Clinician: Referring Shepherd Finnan: Treating Jemiah Ellenburg/Extender: Levi Aland in Treatment: 3 Wound Status Wound Number: 1 Primary Etiology: Lesion Wound Location: Left, Anterior Lower Leg Wound Status: Open Wounding Event: Surgical Injury Date Acquired: 01/04/2022 Weeks Of Treatment: 3 Clustered Wound: No Photos Wound Measurements Length: (cm) 6.7 Width: (cm) 3.5 Depth: (cm) 0.3 Area: (cm) 18.418 Volume: (cm) 5.525 % Reduction in Area: 30.1% % Reduction in Volume: 86% Epithelialization: Small (1-33%) Wound Description Classification: Full Thickness Without Exposed Support Structures Wound Margin: Distinct, outline attached Exudate Amount: Medium Exudate Type: Serosanguineous Exudate Color: red, brown Foul Odor After Cleansing: No Slough/Fibrino Yes Wound Bed Granulation Amount: Medium (34-66%) Exposed Structure Granulation Quality: Red, Hyper-granulation Fascia Exposed: No Necrotic Amount: Medium (34-66%) Fat Layer (Subcutaneous Tissue) Exposed: No Necrotic Quality: Adherent Slough Tendon Exposed: No Muscle Exposed: No Joint Exposed: No Bone Exposed: No Treatment Notes Wound #1 (Lower Leg) Wound Laterality: Left, Anterior Cleanser Soap and Water Discharge Instruction: May shower and wash wound with dial antibacterial soap and water prior to dressing change. Wound Cleanser Discharge Instruction: Cleanse the wound with wound cleanser prior to applying a clean dressing using gauze sponges, not tissue or cotton balls. Peri-Wound Care Topical Primary Dressing Santyl Ointment Discharge Instruction: Apply nickel thick amount to wound bed as instructed Secondary Dressing ABD Pad, 8x10 Discharge Instruction: Apply over primary dressing as directed. Woven  Gauze Sponge, Non-Sterile 4x4 in Discharge Instruction: Apply over primary dressing as directed. Secured With Paper Tape, 2x10 (in/yd) Discharge Instruction: Secure dressing with tape as directed. Compression Wrap ThreePress (3 layer compression wrap) Discharge Instruction: Apply three layer compression as directed. Unnaboot w/Calamine, 4x10 (in/yd) Discharge Instruction: Apply Unnaboot only below the knee, Netting Tubular Discharge Instruction: on left leg Compression Stockings Add-Ons Electronic Signature(s) Signed: 02/18/2022 4:53:14 PM By: Karie Schwalbe RN Signed: 02/18/2022 5:03:05 PM By: Tommie Ard RN Entered By: Tommie Ard on 02/18/2022 15:42:34 -------------------------------------------------------------------------------- Wound Assessment Details Patient Name: Date of Service: [name], RO BERT 02/18/2022 3:15 PM Medical  Record Number: [id number] Patient Account Number: [id number] Date of Birth/Sex: Treating RN: 01/04/1996 (26 y.o. Valma CavaM) Zochol, Jamie Primary Care Nasteho Glantz: Oralia ManisKashddan, Daniel Other Clinician: Referring Joeziah Voit: Treating Kalum Minner/Extender: Levi Alandannon, Jennifer Kashddan, Daniel Weeks in Treatment: 3 Wound Status Wound Number: 2 Primary Etiology: Lesion Wound Location: Left, Lateral Knee Wound Status: Healed - Epithelialized Wounding Event: Surgical Injury Date Acquired: 01/04/2022 Weeks Of Treatment: 3 Clustered Wound: No Photos Wound Measurements Length: (cm) Width: (cm) Depth: (cm) Area: (cm) Volume: (cm) 0 % Reduction in Area: 100% 0 % Reduction in Volume: 100% 0 Epithelialization: Large (67-100%) 0 Tunneling: No 0 Undermining: No Wound Description Classification: Full Thickness Without Exposed Support Structures Wound Margin: Distinct, outline attached Exudate Amount: None Present Foul Odor After Cleansing: No Slough/Fibrino No Wound Bed Granulation Amount: None Present (0%) Exposed Structure Necrotic Amount: None Present  (0%) Fascia Exposed: No Fat Layer (Subcutaneous Tissue) Exposed: No Tendon Exposed: No Muscle Exposed: No Joint Exposed: No Bone Exposed: No Electronic Signature(s) Signed: 02/18/2022 4:53:14 PM By: Karie SchwalbeScotton, Joanne RN Signed: 02/18/2022 5:03:05 PM By: Tommie ArdZochol, Jamie RN Entered By: Karie SchwalbeScotton, Joanne on 02/18/2022 15:58:23 -------------------------------------------------------------------------------- Wound Assessment Details Patient Name: Date of Service: Kyle Reuben LikesLLENDER, RO BERT 02/18/2022 3:15 PM Medical Record Number: [id number] Patient Account Number: [id number] Date of Birth/Sex: Treating RN: 02/18/1996 (26 y.o. Valma CavaM) Zochol, Jamie Primary Care Keitra Carusone: Oralia ManisKashddan, Daniel Other Clinician: Referring Maudry Zeidan: Treating Derry Arbogast/Extender: Levi Alandannon, Jennifer Kashddan, Daniel Weeks in Treatment: 3 Wound Status Wound Number: 3 Primary Etiology: Lesion Wound Location: Left, Lateral Lower Leg Wound Status: Open Wounding Event: Surgical Injury Date Acquired: 01/04/2022 Weeks Of Treatment: 3 Clustered Wound: No Photos Wound Measurements Length: (cm) 2.5 Width: (cm) 1.5 Depth: (cm) 0.1 Area: (cm) 2.945 Volume: (cm) 0.295 % Reduction in Area: 62.5% % Reduction in Volume: 94.6% Epithelialization: Small (1-33%) Tunneling: No Undermining: No Wound Description Classification: Full Thickness Without Exposed Support Structures Wound Margin: Distinct, outline attached Exudate Amount: Medium Exudate Type: Serosanguineous Exudate Color: red, brown Foul Odor After Cleansing: No Slough/Fibrino Yes Wound Bed Granulation Amount: Small (1-33%) Exposed Structure Granulation Quality: Red Fascia Exposed: No Necrotic Amount: Large (67-100%) Fat Layer (Subcutaneous Tissue) Exposed: Yes Necrotic Quality: Eschar, Adherent Slough Tendon Exposed: No Muscle Exposed: No Joint Exposed: No Bone Exposed: No Treatment Notes Wound #3 (Lower Leg) Wound Laterality: Left, Lateral Cleanser Soap and  Water Discharge Instruction: May shower and wash wound with dial antibacterial soap and water prior to dressing change. Wound Cleanser Discharge Instruction: Cleanse the wound with wound cleanser prior to applying a clean dressing using gauze sponges, not tissue or cotton balls. Peri-Wound Care Topical Primary Dressing KerraCel Ag Gelling Fiber Dressing, 2x2 in (silver alginate) Discharge Instruction: Apply silver alginate to wound bed as instructed Secondary Dressing Woven Gauze Sponge, Non-Sterile 4x4 in Discharge Instruction: Apply over primary dressing as directed. Secured With Paper Tape, 2x10 (in/yd) Discharge Instruction: Secure dressing with tape as directed. Compression Wrap ThreePress (3 layer compression wrap) Discharge Instruction: Apply three layer compression as directed. Unnaboot w/Calamine, 4x10 (in/yd) Discharge Instruction: Apply Unnaboot only below the knee. Compression Stockings Add-Ons Electronic Signature(s) Signed: 02/18/2022 4:53:14 PM By: Karie SchwalbeScotton, Joanne RN Signed: 02/18/2022 5:03:05 PM By: Tommie ArdZochol, Jamie RN Entered By: Karie SchwalbeScotton, Joanne on 02/18/2022 15:46:10 -------------------------------------------------------------------------------- Vitals Details Patient Name: Date of Service: [name], RO BERT 02/18/2022 3:15 PM Medical Record Number: [id number] Patient Account Number: [id number] Date of Birth/Sex: Treating RN: 02/27/1996 (26 y.o. Dianna LimboM) Scotton, Joanne Primary Care Sakai Wolford: Oralia ManisKashddan, Daniel Other Clinician: Referring Tedric Leeth: Treating Lundynn Cohoon/Extender: Levi Alandannon, Jennifer Kashddan, Daniel Weeks in Treatment: 3  Vital Signs Time Taken: 15:39 Temperature (F): 98.4 Height (in): 71 Pulse (bpm): 102 Weight (lbs): 300 Respiratory Rate (breaths/min): 16 Body Mass Index (BMI): 41.8 Blood Pressure (mmHg): 134/85 Reference Range: 80 - 120 mg / dl Electronic Signature(s) Signed: 02/18/2022 4:53:14 PM By: Karie Schwalbe RN Entered By: Karie Schwalbe  on 02/18/2022 16:37:14

## 2022-02-25 ENCOUNTER — Encounter (HOSPITAL_BASED_OUTPATIENT_CLINIC_OR_DEPARTMENT_OTHER): Payer: 59 | Admitting: General Surgery

## 2022-02-28 ENCOUNTER — Encounter (HOSPITAL_BASED_OUTPATIENT_CLINIC_OR_DEPARTMENT_OTHER): Payer: 59 | Admitting: General Surgery

## 2022-02-28 DIAGNOSIS — L97822 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sed: Secondary | ICD-10-CM | POA: Diagnosis not present

## 2022-02-28 NOTE — Progress Notes (Signed)
Jen Houston, Kyle ([id number]) Visit Report for 02/28/2022 Chief Complaint Document Details Patient Name: Date of Service: [name], [name] 02/28/2022 12:45 PM Medical Record Number: [id number] Patient Account Number: [id number] Date of Birth/Sex: Treating RN: 04/09/1996 (26 y.o. Marlan PalauM) Herrington, Taylor Primary Care Provider: Oralia ManisKashddan, Daniel Other Clinician: Referring Provider: Treating Provider/Extender: Levi Alandannon, Dayvian Blixt Kashddan, Daniel Weeks in Treatment: 4 Information Obtained from: Patient Chief Complaint Patient presents to the wound care center with open non-healing surgical wound(s) Electronic Signature(s) Signed: 02/28/2022 1:21:25 PM By: Duanne Guessannon, Mercy Leppla MD FACS Entered By: Duanne Guessannon, Pj Zehner on 02/28/2022 13:21:25 -------------------------------------------------------------------------------- Debridement Details Patient Name: Date of Service: [name], [name] 02/28/2022 12:45 PM Medical Record Number: [id number] Patient Account Number: [id number] Date of Birth/Sex: Treating RN: 07/10/1995 (26 y.o. Marlan PalauM) Herrington, Taylor Primary Care Provider: Oralia ManisKashddan, Daniel Other Clinician: Referring Provider: Treating Provider/Extender: Levi Alandannon, Chantelle Verdi Kashddan, Daniel Weeks in Treatment: 4 Debridement Performed for Assessment: Wound #1 Left,Anterior Lower Leg Performed By: Physician Duanne Guessannon, Zeenat Jeanbaptiste, MD Debridement Type: Debridement Level of Consciousness (Pre-procedure): Awake and Alert Pre-procedure Verification/Time Out Yes - 13:10 Taken: Start Time: 13:10 Pain Control: Lidocaine 5% topical ointment T Area Debrided (L x W): otal 5.4 (cm) x 2.9 (cm) = 15.66 (cm) Tissue and other material debrided: Non-Viable, Slough, Subcutaneous, Slough Level: Skin/Subcutaneous Tissue Debridement Description: Excisional Instrument: Curette Bleeding: Minimum Hemostasis Achieved: Pressure Procedural Pain: 0 Post Procedural Pain: 0 Response to Treatment: Procedure was tolerated well Level of  Consciousness (Post- Awake and Alert procedure): Post Debridement Measurements of Total Wound Length: (cm) 5.4 Width: (cm) 2.9 Depth: (cm) 0.2 Volume: (cm) 2.46 Character of Wound/Ulcer Post Debridement: Improved Post Procedure Diagnosis Same as Pre-procedure Notes scribed for Dr. Lady Garyannon by Samuella Bruinaylor Herrington, RN Electronic Signature(s) Signed: 02/28/2022 1:59:41 PM By: Duanne Guessannon, Special Ranes MD FACS Signed: 02/28/2022 4:46:26 PM By: Samuella BruinHerrington, Taylor Entered By: Samuella BruinHerrington, Taylor on 02/28/2022 13:11:42 -------------------------------------------------------------------------------- Debridement Details Patient Name: Date of Service: [name], [name] 02/28/2022 12:45 PM Medical Record Number: [id number] Patient Account Number: [id number] Date of Birth/Sex: Treating RN: 02/28/1996 (26 y.o. Marlan PalauM) Herrington, Taylor Primary Care Provider: Oralia ManisKashddan, Daniel Other Clinician: Referring Provider: Treating Provider/Extender: Levi Alandannon, Benedicto Capozzi Kashddan, Daniel Weeks in Treatment: 4 Debridement Performed for Assessment: Wound #3 Left,Lateral Lower Leg Performed By: Physician Duanne Guessannon, Americo Vallery, MD Debridement Type: Debridement Level of Consciousness (Pre-procedure): Awake and Alert Pre-procedure Verification/Time Out Yes - 13:10 Taken: Start Time: 13:10 Pain Control: Lidocaine 5% topical ointment T Area Debrided (L x W): otal 1.9 (cm) x 0.7 (cm) = 1.33 (cm) Tissue and other material debrided: Non-Viable, Slough, Slough Level: Non-Viable Tissue Debridement Description: Selective/Open Wound Instrument: Curette Bleeding: Minimum Hemostasis Achieved: Pressure Procedural Pain: 0 Post Procedural Pain: 0 Response to Treatment: Procedure was tolerated well Level of Consciousness (Post- Awake and Alert procedure): Post Debridement Measurements of Total Wound Length: (cm) 1.9 Width: (cm) 0.7 Depth: (cm) 0.1 Volume: (cm) 0.104 Character of Wound/Ulcer Post Debridement: Improved Post Procedure  Diagnosis Same as Pre-procedure Notes scribed for Dr. Lady Garyannon by Samuella Bruinaylor Herrington, RN Electronic Signature(s) Signed: 02/28/2022 1:59:41 PM By: Duanne Guessannon, Faustino Luecke MD FACS Signed: 02/28/2022 4:46:26 PM By: Samuella BruinHerrington, Taylor Entered By: Samuella BruinHerrington, Taylor on 02/28/2022 13:13:38 -------------------------------------------------------------------------------- HPI Details Patient Name: Date of Service: [name], [name] 02/28/2022 12:45 PM Medical Record Number: [id number] Patient Account Number: [id number] Date of Birth/Sex: Treating RN: 04/13/1996 (26 y.o. Marlan PalauM) Herrington, Taylor Primary Care Provider: Oralia ManisKashddan, Daniel Other Clinician: Referring Provider: Treating Provider/Extender: Levi Alandannon, Dimitrius Steedman Kashddan, Daniel Weeks in Treatment: 4 History of Present Illness HPI Description: ADMISSION 01/28/2022 This is a 26 year old man who  is employed as a Magazine features editor. He was in [place] and apparently was hospitalized for dehydration and possible sepsis. We do not have any records from his hospitalization, but at some point he underwent surgery and has 3 open wounds. He reports that they did this to "take cultures." Again, no records are available to know if this was done, what those results were, or what antibiotics he was given. He is now here today for further evaluation and management of 3 left lower extremity wounds. He has a wound just caudal to his knee on the lateral aspect of his leg, another on his lateral calf, and a third on his anterior tibial surface. All of them have slough. The anterior tibial surface wound is the largest and also has some necrotic fat present. There is periwound erythema and tenderness. The placement of the incisions does not correlate with 4 compartment fasciotomy so I am not entirely sure what the thought process of the surgeons in [place] was or what specifically they were operating for. Hopefully we will receive those documents before his next  visit. 02/08/2022: We have not received any information from [place]. His wounds are little bit smaller today, but still have a fair amount of slough as well as some nonviable subcutaneous tissue present. The culture that I took last week returned positive only for very low levels of methicillin sensitive Staph aureus, nonpathologic levels. 02/18/2022: The left lateral knee wound is healed. There is just a bit of eschar on the surface that once removed, revealed complete epithelialization. The more proximal and lateral lower leg wound is quite a bit smaller with just a little bit of slough and some good robust looking granulation tissue. The largest of the wounds on the anterior tibia has a fair amount of slough buildup but has contracted quite a bit since our last visit. 02/28/2022: The more proximal lateral leg wound is flush with the surrounding skin surface and has epithelialized significantly. There is just a small open portion with a light layer of slough present. The large anterior tibial wound is much cleaner today, but still has some slough and nonviable subcutaneous tissue present. Electronic Signature(s) Signed: 02/28/2022 1:22:18 PM By: [name] MD FACS Entered By: [name] on 02/28/2022 13:22:18 -------------------------------------------------------------------------------- Physical Exam Details Patient Name: Date of Service: [name], [name] 02/28/2022 12:45 PM Medical Record Number: [id number] Patient Account Number: [id number] Date of Birth/Sex: Treating RN: Oct 25, 1995 (26 y.o. [name] Primary Care Provider: Tera Partridge Other Clinician: Referring Provider: Treating Provider/Extender: Edwena Blow in Treatment: 4 Constitutional Slightly hypertensive. . . . No acute distress.Marland Kitchen Respiratory Normal work of breathing on room air.. Notes 02/28/2022: The more proximal lateral leg wound is flush with the surrounding skin  surface and has epithelialized significantly. There is just a small open portion with a light layer of slough present. The large anterior tibial wound is much cleaner today, but still has some slough and nonviable subcutaneous tissue present. Electronic Signature(s) Signed: 02/28/2022 1:22:50 PM By: [name] MD FACS Entered By: [name] on 02/28/2022 13:22:50 -------------------------------------------------------------------------------- Physician Orders Details Patient Name: Date of Service: [name], [name] 02/28/2022 12:45 PM Medical Record Number: [id number] Patient Account Number: [id number] Date of Birth/Sex: Treating RN: [birth year] (26 y.o. [name] Primary Care Provider: Tera Partridge Other Clinician: Referring Provider: Treating Provider/Extender: Edwena Blow in Treatment: 4 Verbal / Phone Orders: No Diagnosis Coding Follow-up Appointments ppointment in 1 week. -  Dr Lady Gary - Room 3 Return A Anesthetic Wound #1 Left,Anterior Lower Leg (In clinic) Topical Lidocaine 5% applied to wound bed - In Clinic Wound #3 Left,Lateral Lower Leg (In clinic) Topical Lidocaine 5% applied to wound bed Bathing/ Shower/ Hygiene May shower with protection but do not get wound dressing(s) wet. - Please do not get wraps on left leg wet-use a cast protector to cover leg when bathing Edema Control - Lymphedema / SCD / Other Elevate legs to the level of the heart or above for 30 minutes daily and/or when sitting, a frequency of: Avoid standing for long periods of time. Wound Treatment Wound #1 - Lower Leg Wound Laterality: Left, Anterior Cleanser: Soap and Water 1 x Per Week/30 Days Discharge Instructions: May shower and wash wound with dial antibacterial soap and water prior to dressing change. Cleanser: Wound Cleanser (Generic) 1 x Per Week/30 Days Discharge Instructions: Cleanse the wound with wound cleanser prior to applying a  clean dressing using gauze sponges, not tissue or cotton balls. Prim Dressing: Santyl Ointment (Generic) 1 x Per Week/30 Days ary Discharge Instructions: Apply nickel thick amount to wound bed as instructed Secondary Dressing: ABD Pad, 8x10 1 x Per Week/30 Days Discharge Instructions: Apply over primary dressing as directed. Secondary Dressing: Woven Gauze Sponge, Non-Sterile 4x4 in 1 x Per Week/30 Days Discharge Instructions: Apply over primary dressing as directed. Secured With: Paper Tape, 2x10 (in/yd) 1 x Per Week/30 Days Discharge Instructions: Secure dressing with tape as directed. Compression Wrap: ThreePress (3 layer compression wrap) 1 x Per Week/30 Days Discharge Instructions: Apply three layer compression as directed. Compression Wrap: Unnaboot w/Calamine, 4x10 (in/yd) 1 x Per Week/30 Days Discharge Instructions: Apply Unnaboot only below the knee, Compression Wrap: Netting Tubular 1 x Per Week/30 Days Discharge Instructions: on left leg Wound #3 - Lower Leg Wound Laterality: Left, Lateral Cleanser: Soap and Water 1 x Per Day/30 Days Discharge Instructions: May shower and wash wound with dial antibacterial soap and water prior to dressing change. Cleanser: Wound Cleanser (Generic) 1 x Per Day/30 Days Discharge Instructions: Cleanse the wound with wound cleanser prior to applying a clean dressing using gauze sponges, not tissue or cotton balls. Prim Dressing: KerraCel Ag Gelling Fiber Dressing, 2x2 in (silver alginate) 1 x Per Day/30 Days ary Discharge Instructions: Apply silver alginate to wound bed as instructed Secondary Dressing: Woven Gauze Sponge, Non-Sterile 4x4 in (Generic) 1 x Per Day/30 Days Discharge Instructions: Apply over primary dressing as directed. Secured With: Paper Tape, 2x10 (in/yd) (Generic) 1 x Per Day/30 Days Discharge Instructions: Secure dressing with tape as directed. Compression Wrap: ThreePress (3 layer compression wrap) 1 x Per Day/30  Days Discharge Instructions: Apply three layer compression as directed. Compression Wrap: Unnaboot w/Calamine, 4x10 (in/yd) 1 x Per Day/30 Days Discharge Instructions: Apply Unnaboot only below the knee. Electronic Signature(s) Signed: 02/28/2022 1:59:41 PM By: Duanne Guess MD FACS Signed: 02/28/2022 4:46:26 PM By: Samuella Bruin Previous Signature: 02/28/2022 1:22:57 PM Version By: Duanne Guess MD FACS Entered By: Samuella Bruin on 02/28/2022 13:25:44 -------------------------------------------------------------------------------- Problem List Details Patient Name: Date of Service: Kyle Reuben Likes, [name] 02/28/2022 12:45 PM Medical Record Number: [id number] Patient Account Number: [id number] Date of Birth/Sex: Treating RN: [birth year] (26 y.o. [name] Primary Care Provider: Oralia Manis Other Clinician: Referring Provider: Treating Provider/Extender: Levi Aland in Treatment: 4 Active Problems ICD-10 Encounter Code Description Active Date MDM Diagnosis 8541592448 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sed8/25/2023 No Yes  E66.01 Morbid (severe) obesity due to excess calories 01/28/2022 No Yes Inactive Problems Resolved Problems Electronic Signature(s) Signed: 02/28/2022 1:21:12 PM By: Duanne Guess MD FACS Entered By: Duanne Guess on 02/28/2022 13:21:12 -------------------------------------------------------------------------------- Progress Note Details Patient Name: Date of Service: Kyle Reuben Likes, [name] 02/28/2022 12:45 PM Medical Record Number: [id number] Patient Account Number: [id number] Date of Birth/Sex: Treating RN: 21-Jun-1995 (26 y.o. 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 He was in [place] and apparently was hospitalized for dehydration and possible sepsis. We do not have any records from his hospitalization, but at some point he underwent surgery and has 3 open wounds. He reports that they did this to "take cultures." Again, no records are available to know if this was done, what those results were, or what antibiotics he was given. He is now here today for further evaluation and management of 3 left lower extremity wounds. He has a wound just caudal to his knee on the lateral aspect of his leg, another on his lateral calf, and a third on his anterior tibial surface. All of them have slough. The anterior tibial surface wound is the largest and also has some necrotic fat present. There is periwound erythema and tenderness. The placement of the incisions does not correlate with 4 compartment fasciotomy so I am not entirely sure what the thought process of the surgeons in [place] was or what specifically they were operating for. Hopefully we will receive those documents before his next visit. 02/08/2022: We have not received any information from [place]. His wounds are little bit smaller today, but still have a fair amount of slough as well as some nonviable subcutaneous tissue present. The culture that I took last week returned positive only for very low levels of methicillin sensitive Staph aureus, nonpathologic levels. 02/18/2022: The left lateral knee wound is healed. There is just a bit of eschar on the surface that once removed, revealed complete epithelialization. The more proximal and lateral lower leg wound is quite a bit smaller with just a little bit of slough and some good robust looking granulation tissue. The largest of the wounds on the  anterior tibia has a fair amount of slough buildup but has contracted quite a bit since our last visit. 02/28/2022: The more proximal lateral leg wound is flush with the surrounding skin surface and has epithelialized significantly. There is just a small open portion with a light layer of slough present. The large anterior tibial wound is much cleaner today, but still has some slough and nonviable subcutaneous tissue present. Patient History Information obtained from Patient. Social History Former smoker, Alcohol Use - Rarely, Drug Use - No History, Caffeine Use - Moderate. Objective Constitutional Slightly hypertensive. No acute distress.. Vitals Time Taken: 12:58 PM, Height: 71 in, Weight: 300 lbs, BMI: 41.8, Temperature: 98.7 F, Pulse: 83 bpm, Respiratory Rate: 16 breaths/min, Blood Pressure: 140/88 mmHg. Respiratory Normal work of breathing on room air.. General Notes: 02/28/2022: The more proximal lateral leg wound is flush with the surrounding skin surface and has epithelialized significantly. There is just a small open portion with a light layer of slough present. The large anterior tibial wound is much cleaner today,  but still has some slough and nonviable subcutaneous tissue present. Integumentary (Hair, Skin) Wound #1 status is Open. Original cause of wound was Surgical Injury. The date acquired was: 01/04/2022. The wound has been in treatment 4 weeks. The wound is located on the Left,Anterior Lower Leg. The wound measures 5.4cm length x 2.9cm width x 0.2cm depth; 12.299cm^2 area and 2.46cm^3 volume. There is Fat Layer (Subcutaneous Tissue) exposed. There is no tunneling or undermining noted. There is a medium amount of serosanguineous drainage noted. The wound margin is distinct with the outline attached to the wound base. There is large (67-100%) red, hyper - granulation within the wound bed. There is a small (1- 33%) amount of necrotic tissue within the wound bed including Adherent  Slough. Wound #3 status is Open. Original cause of wound was Surgical Injury. The date acquired was: 01/04/2022. The wound has been in treatment 4 weeks. The wound is located on the Left,Lateral Lower Leg. The wound measures 1.9cm length x 0.7cm width x 0.1cm depth; 1.045cm^2 area and 0.104cm^3 volume. There is Fat Layer (Subcutaneous Tissue) exposed. There is no tunneling or undermining noted. There is a medium amount of serosanguineous drainage noted. The wound margin is distinct with the outline attached to the wound base. There is large (67-100%) red granulation within the wound bed. There is no necrotic tissue within the wound bed. Assessment Active Problems ICD-10 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sed Morbid (severe) obesity due to excess calories Procedures Wound #1 Pre-procedure diagnosis of Wound #1 is a Lesion located on the Left,Anterior Lower Leg . There was a Excisional Skin/Subcutaneous Tissue Debridement with a total area of 15.66 sq cm performed by Duanne Guess, MD. With the following instrument(s): Curette to remove Non-Viable tissue/material. Material removed includes Subcutaneous Tissue and Slough and after achieving pain control using Lidocaine 5% topical ointment. No specimens were taken. A time out was conducted at 13:10, prior to the start of the procedure. A Minimum amount of bleeding was controlled with Pressure. The procedure was tolerated well with a pain level of 0 throughout and a pain level of 0 following the procedure. Post Debridement Measurements: 5.4cm length x 2.9cm width x 0.2cm depth; 2.46cm^3 volume. Character of Wound/Ulcer Post Debridement is improved. Post procedure Diagnosis Wound #1: Same as Pre-Procedure General Notes: scribed for Dr. Lady Gary by Samuella Bruin, RN. Pre-procedure diagnosis of Wound #1 is a Lesion located on the Left,Anterior Lower Leg . There was a Three Layer Compression Therapy  Procedure by Samuella Bruin, RN. Post procedure Diagnosis Wound #1: Same as Pre-Procedure Wound #3 Pre-procedure diagnosis of Wound #3 is a Lesion located on the Left,Lateral Lower Leg . There was a Selective/Open Wound Non-Viable Tissue Debridement with a total area of 1.33 sq cm performed by Duanne Guess, MD. With the following instrument(s): Curette to remove Non-Viable tissue/material. Material removed includes [facility] after achieving pain control using Lidocaine 5% topical ointment. No specimens were taken. A time out was conducted at 13:10, prior to the start of the procedure. A Minimum amount of bleeding was controlled with Pressure. The procedure was tolerated well with a pain level of 0 throughout and a pain level of 0 following the procedure. Post Debridement Measurements: 1.9cm length x 0.7cm width x 0.1cm depth; 0.104cm^3 volume. Character of Wound/Ulcer Post Debridement is improved. Post procedure Diagnosis Wound #3: Same as Pre-Procedure General Notes: scribed for Dr. Lady Gary by Samuella Bruin, RN. Plan Follow-up Appointments: Return Appointment in 1 week. -  Dr Lady Gary - Room 3 Anesthetic: Wound #1 Left,Anterior Lower Leg: (In clinic) Topical Lidocaine 5% applied to wound bed - In Clinic Wound #3 Left,Lateral Lower Leg: (In clinic) Topical Lidocaine 5% applied to wound bed Bathing/ Shower/ Hygiene: May shower with protection but do not get wound dressing(s) wet. - Please do not get wraps on left leg wet-use a cast protector to cover leg when bathing Edema Control - Lymphedema / SCD / Other: Elevate legs to the level of the heart or above for 30 minutes daily and/or when sitting, a frequency of: Avoid standing for long periods of time. WOUND #1: - Lower Leg Wound Laterality: Left, Anterior Cleanser: Soap and Water 1 x Per Week/30 Days Discharge Instructions: May shower and wash wound with dial antibacterial soap and water prior to dressing change. Cleanser: Wound  Cleanser (Generic) 1 x Per Week/30 Days Discharge Instructions: Cleanse the wound with wound cleanser prior to applying a clean dressing using gauze sponges, not tissue or cotton balls. Prim Dressing: Santyl Ointment (Generic) 1 x Per Week/30 Days ary Discharge Instructions: Apply nickel thick amount to wound bed as instructed Secondary Dressing: ABD Pad, 8x10 1 x Per Week/30 Days Discharge Instructions: Apply over primary dressing as directed. Secondary Dressing: Woven Gauze Sponge, Non-Sterile 4x4 in 1 x Per Week/30 Days Discharge Instructions: Apply over primary dressing as directed. Secured With: Paper T ape, 2x10 (in/yd) 1 x Per Week/30 Days Discharge Instructions: Secure dressing with tape as directed. Com pression Wrap: ThreePress (3 layer compression wrap) 1 x Per Week/30 Days Discharge Instructions: Apply three layer compression as directed. Com pression Wrap: Unnaboot w/Calamine, 4x10 (in/yd) 1 x Per Week/30 Days Discharge Instructions: Apply Unnaboot only below the knee, Com pression Wrap: Netting Tubular 1 x Per Week/30 Days Discharge Instructions: on left leg WOUND #3: - Lower Leg Wound Laterality: Left, Lateral Cleanser: Soap and Water 1 x Per Day/30 Days Discharge Instructions: May shower and wash wound with dial antibacterial soap and water prior to dressing change. Cleanser: Wound Cleanser (Generic) 1 x Per Day/30 Days Discharge Instructions: Cleanse the wound with wound cleanser prior to applying a clean dressing using gauze sponges, not tissue or cotton balls. Prim Dressing: KerraCel Ag Gelling Fiber Dressing, 2x2 in (silver alginate) 1 x Per Day/30 Days ary Discharge Instructions: Apply silver alginate to wound bed as instructed Secondary Dressing: Woven Gauze Sponge, Non-Sterile 4x4 in (Generic) 1 x Per Day/30 Days Discharge Instructions: Apply over primary dressing as directed. Secured With: Paper T ape, 2x10 (in/yd) (Generic) 1 x Per Day/30 Days Discharge  Instructions: Secure dressing with tape as directed. Com pression Wrap: ThreePress (3 layer compression wrap) 1 x Per Day/30 Days Discharge Instructions: Apply three layer compression as directed. Com pression Wrap: Unnaboot w/Calamine, 4x10 (in/yd) 1 x Per Day/30 Days Discharge Instructions: Apply Unnaboot only below the knee. 02/28/2022: The more proximal lateral leg wound is flush with the surrounding skin surface and has epithelialized significantly. There is just a small open portion with a light layer of slough present. The large anterior tibial wound is much cleaner today, but still has some slough and nonviable subcutaneous tissue present. Used a curette to debride slough off the lateral leg wound and slough and nonviable subcutaneous tissue from the anterior tibial wound. We will continue to apply Santyl to the anterior tibial wound and silver alginate to the lateral leg wound. Continue 3 layer compression. Follow-up in 1 week. Electronic Signature(s) Signed: 03/01/2022 8:03:18 AM By: Duanne Guess MD FACS Previous Signature: 02/28/2022  1:23:26 PM Version By: Duanne Guess MD FACS Entered By: Duanne Guess on 03/01/2022 08:03:18 -------------------------------------------------------------------------------- HxROS Details Patient Name: Date of Service: [name], [name] 02/28/2022 12:45 PM Medical Record Number: [id number] Patient Account Number: [id number] Date of Birth/Sex: Treating RN: February 21, 1996 (27 y.o. [name] Primary Care Provider: Oralia Manis Other Clinician: Referring Provider: Treating Provider/Extender: Levi Aland in Treatment: 4 Information Obtained From Patient Immunizations Pneumococcal Vaccine: Received Pneumococcal Vaccination: No Implantable Devices None Family and Social History Former smoker; Alcohol Use: Rarely; Drug Use: No History; Caffeine Use: Moderate; Financial Concerns: No; Food, Clothing or  Shelter Needs: No; Support System Lacking: No; Transportation Concerns: No Electronic Signature(s) Signed: 02/28/2022 1:59:41 PM By: Duanne Guess MD FACS Signed: 02/28/2022 4:46:26 PM By: Gelene Mink By: Duanne Guess on 02/28/2022 13:22:23 -------------------------------------------------------------------------------- SuperBill Details Patient Name: Date of Service: Kyle Reuben Likes, [name] 02/28/2022 Medical Record Number: [id number] Patient Account Number: [id number] Date of Birth/Sex: Treating RN: 1996-01-07 (25 y.o. 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 Morbid (severe) obesity due to excess calories Facility Procedures CPT4 Code: [id number] [id number] Description: 11042 - DEB SUBQ TISSUE 20 SQ CM/< ICD-10 Diagnosis Description 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 97597 - DEBRIDE WOUND 1ST 20 SQ CM OR < ICD-10 Diagnosis Description L97.822  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sed Modifier: d Quantity: 1 1 Physician Procedures : CPT4 Code Description Modifier [id number] 99214 - WC PHYS LEVEL 4 - EST PT 25 ICD-10 Diagnosis Description 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d E66.01 Morbid (severe) obesity due to excess calories Quantity: 1 : [id number] 11042 - WC PHYS SUBQ TISS 20 SQ CM ICD-10 Diagnosis Description 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d Quantity: 1 : [id number] 97597 - WC PHYS DEBR WO ANESTH 20 SQ CM ICD-10 Diagnosis Description L97.822 Non-pressure chronic ulcer of other part of left  lower leg with fat layer exposed Quantity: 1 Electronic Signature(s) Signed: 03/01/2022 8:03:30 AM By: Duanne Guess MD FACS Previous Signature: 02/28/2022 1:23:42 PM Version By: Duanne Guess MD FACS Entered By: Duanne Guess on 03/01/2022 08:03:29

## 2022-02-28 NOTE — Progress Notes (Signed)
[name], [name] ([id number]) Visit Report for 02/28/2022 Arrival Information Details Patient Name: Date of Service: [name] 02/28/2022 12:45 PM Medical Record Number: [id number] Patient Account Number: [id number] Date of Birth/Sex: Treating RN: Jan 19, 1996 (26 y.o. [name] Primary Care Shawne Eskelson: Tera Partridge Other Clinician: Referring Fuad Forget: Treating Darcey Cardy/Extender: Edwena Blow in Treatment: 4 Visit Information History Since Last Visit Added or deleted any medications: No Patient Arrived: Ambulatory Any new allergies or adverse reactions: No Arrival Time: 12:58 Had a fall or experienced change in No Accompanied By: partner activities of daily living that may affect Transfer Assistance: None risk of falls: Patient Identification Verified: Yes Signs or symptoms of abuse/neglect since last visito No Secondary Verification Process Completed: Yes Hospitalized since last visit: No Patient Requires Transmission-Based Precautions: No Implantable device outside of the clinic excluding No Patient Has Alerts: No cellular tissue based products placed in the center since last visit: Has Dressing in Place as Prescribed: Yes Has Compression in Place as Prescribed: No Pain Present Now: Yes Electronic Signature(s) Signed: 02/28/2022 4:46:26 PM By: Adline Peals Entered By: Adline Peals on 02/28/2022 12:58:39 -------------------------------------------------------------------------------- Compression Therapy Details Patient Name: Date of Service: [name], [name] 02/28/2022 12:45 PM Medical Record Number: [id number] Patient Account Number: [id number] Date of Birth/Sex: Treating RN: Apr 26, 1996 (26 y.o. [name] Primary Care Francy Mcilvaine: Tera Partridge Other Clinician: Referring Wissam Resor: Treating Viyan Rosamond/Extender: Edwena Blow in Treatment: 4 Compression Therapy Performed for  Wound Assessment: Wound #1 Left,Anterior Lower Leg Performed By: Clinician Adline Peals, RN Compression Type: Three Layer Post Procedure Diagnosis Same as Pre-procedure Electronic Signature(s) Signed: 02/28/2022 4:46:26 PM By: Adline Peals Entered By: Adline Peals on 02/28/2022 13:13:57 -------------------------------------------------------------------------------- Encounter Discharge Information Details Patient Name: Date of Service: [name], [name] 02/28/2022 12:45 PM Medical Record Number: [id number] Patient Account Number: [id number] Date of Birth/Sex: Treating RN: [birth year] (26 y.o. [name] Primary Care Cole Klugh: Tera Partridge Other Clinician: Referring Melaine Mcphee: Treating Sharone Picchi/Extender: Edwena Blow in Treatment: 4 Encounter Discharge Information Items Post Procedure Vitals Discharge Condition: Stable Temperature (F): 98.7 Ambulatory Status: Ambulatory Pulse (bpm): 83 Discharge Destination: Home Respiratory Rate (breaths/min): 16 Transportation: Private Auto Blood Pressure (mmHg): 140/88 Accompanied By: partner Schedule Follow-up Appointment: Yes Clinical Summary of Care: Patient Declined Electronic Signature(s) Signed: 02/28/2022 4:46:26 PM By: Adline Peals Entered By: Adline Peals on 02/28/2022 14:41:59 -------------------------------------------------------------------------------- Lower Extremity Assessment Details Patient Name: Date of Service: [name], [name] 02/28/2022 12:45 PM Medical Record Number: [id number] Patient Account Number: [id number] Date of Birth/Sex: Treating RN: 10-05-1995 (26 y.o. [name] Primary Care Sheera Illingworth: Tera Partridge Other Clinician: Referring Kennesha Brewbaker: Treating Thania Woodlief/Extender: Edwena Blow in Treatment: 4 Edema Assessment Assessed: [name]: No] [name]: No] E[Left: dema] [Right: :] Calf Left:  Right: Point of Measurement: From Medial Instep 48.3 cm Ankle Left: Right: Point of Measurement: From Medial Instep 28.4 cm Vascular Assessment Pulses: Dorsalis Pedis Palpable: [Left:Yes] Electronic Signature(s) Signed: 02/28/2022 4:46:26 PM By: Adline Peals Entered By: Adline Peals on 02/28/2022 13:01:20 -------------------------------------------------------------------------------- Multi Wound Chart Details Patient Name: Date of Service: [name], [name] 02/28/2022 12:45 PM Medical Record Number: [id number] Patient Account Number: [id number] Date of Birth/Sex: Treating RN: September 19, 1995 (26 y.o. [name] Primary Care Skylah Delauter: Tera Partridge Other Clinician: Referring Kimba Lottes: Treating Idolina Mantell/Extender: Edwena Blow in Treatment: 4 Vital Signs Height(in): 71 Pulse(bpm): 12 Weight(lbs): 300 Blood Pressure(mmHg): 140/88 Body Mass Index(BMI): 41.8 Temperature(F): 98.7 Respiratory Rate(breaths/min): 16 Photos: [N/A:N/A] Left, Anterior  Lower Leg Left, Lateral Lower Leg N/A Wound Location: Surgical Injury Surgical Injury N/A Wounding Event: Lesion Lesion N/A Primary Etiology: 01/04/2022 01/04/2022 N/A Date Acquired: 4 4 N/A Weeks of Treatment: Open Open N/A Wound Status: No No N/A Wound Recurrence: 5.4x2.9x0.2 1.9x0.7x0.1 N/A Measurements L x W x D (cm) 12.299 1.045 N/A A (cm) : rea 2.46 0.104 N/A Volume (cm) : 53.30% 86.70% N/A % Reduction in A rea: 93.80% 98.10% N/A % Reduction in Volume: Full Thickness Without Exposed Full Thickness Without Exposed N/A Classification: Support Structures Support Structures Medium Medium N/A Exudate A mount: Serosanguineous Serosanguineous N/A Exudate Type: red, brown red, brown N/A Exudate Color: Distinct, outline attached Distinct, outline attached N/A Wound Margin: Large (67-100%) Large (67-100%) N/A Granulation A mount: Red, Hyper-granulation Red  N/A Granulation Quality: Small (1-33%) None Present (0%) N/A Necrotic A mount: Fat Layer (Subcutaneous Tissue): Yes Fat Layer (Subcutaneous Tissue): Yes N/A Exposed Structures: Fascia: No Fascia: No Tendon: No Tendon: No Muscle: No Muscle: No Joint: No Joint: No Bone: No Bone: No Medium (34-66%) Medium (34-66%) N/A Epithelialization: Debridement - Excisional Debridement - Selective/Open Wound N/A Debridement: Pre-procedure Verification/Time Out 13:10 13:10 N/A Taken: Lidocaine 5% topical ointment Lidocaine 5% topical ointment N/A Pain Control: Subcutaneous, [employer] N/A Tissue Debrided: Skin/Subcutaneous Tissue Non-Viable Tissue N/A Level: 15.66 1.33 N/A Debridement A (sq cm): rea Curette Curette N/A Instrument: Minimum Minimum N/A Bleeding: Pressure Pressure N/A Hemostasis A chieved: 0 0 N/A Procedural Pain: 0 0 N/A Post Procedural Pain: Procedure was tolerated well Procedure was tolerated well N/A Debridement Treatment Response: 5.4x2.9x0.2 1.9x0.7x0.1 N/A Post Debridement Measurements L x W x D (cm) 2.46 0.104 N/A Post Debridement Volume: (cm) Compression Therapy Debridement N/A Procedures Performed: Debridement Treatment Notes Electronic Signature(s) Signed: 02/28/2022 1:21:19 PM By: [name] MD FACS Signed: 02/28/2022 4:46:26 PM By: Adline Peals Entered By: [name] on 02/28/2022 13:21:19 -------------------------------------------------------------------------------- Multi-Disciplinary Care Plan Details Patient Name: Date of Service: [facility] [place] 02/28/2022 12:45 PM Medical Record Number: [id number] Patient Account Number: [id number] Date of Birth/Sex: Treating RN: 1995-10-05 (26 y.o. [name] Primary Care Jacobe Study: Tera Partridge Other Clinician: Referring Tyauna Lacaze: Treating Micha Dosanjh/Extender: Edwena Blow in Treatment: 4 Active Inactive Wound/Skin Impairment Nursing  Diagnoses: Knowledge deficit related to ulceration/compromised skin integrity Goals: Patient/caregiver will verbalize understanding of skin care regimen Date Initiated: 01/28/2022 Target Resolution Date: 06/05/2022 Goal Status: Active Interventions: Assess ulceration(s) every visit Treatment Activities: Skin care regimen initiated : 01/28/2022 Notes: Electronic Signature(s) Signed: 02/28/2022 4:46:26 PM By: Adline Peals Entered By: Adline Peals on 02/28/2022 13:09:58 -------------------------------------------------------------------------------- Pain Assessment Details Patient Name: Date of Service: [name], [name] 02/28/2022 12:45 PM Medical Record Number: [id number] Patient Account Number: [id number] Date of Birth/Sex: Treating RN: [birth year] (26 y.o. [name] Primary Care Shantinique Picazo: Tera Partridge Other Clinician: Referring Vandella Ord: Treating Chayse Gracey/Extender: Edwena Blow in Treatment: 4 Active Problems Location of Pain Severity and Description of Pain Patient Has Paino Yes Site Locations Pain Location: Pain Location: Pain in Ulcers Duration of the Pain. Constant / Intermittento Intermittent Rate the pain. Current Pain Level: 5 Character of Pain Describe the Pain: Aching, Tender Pain Management and Medication Current Pain Management: Electronic Signature(s) Signed: 02/28/2022 4:46:26 PM By: Adline Peals Entered By: Adline Peals on 02/28/2022 12:59:17 -------------------------------------------------------------------------------- Patient/Caregiver Education Details Patient Name: Date of Service: [name], [name] 9/25/2023andnbsp12:45 PM Medical Record Number: [id number] Patient Account Number: [id number] Date of Birth/Gender: Treating RN: 12-Mar-1996 (26 y.o. [name] Primary Care Physician: Lennox Laity,  Quillian Quince Other Clinician: Referring Physician: Treating Physician/Extender:  Edwena Blow in Treatment: 4 Education Assessment Education Provided To: Patient Education Topics Provided Wound/Skin Impairment: Methods: Explain/Verbal Responses: Reinforcements needed, State content correctly Electronic Signature(s) Signed: 02/28/2022 4:46:26 PM By: Adline Peals Entered By: Adline Peals on 02/28/2022 13:10:15 -------------------------------------------------------------------------------- Wound Assessment Details Patient Name: Date of Service: [name], [name] 02/28/2022 12:45 PM Medical Record Number: [id number] Patient Account Number: [id number] Date of Birth/Sex: Treating RN: Mar 27, 1996 (26 y.o. [name] Primary Care Shiori Adcox: Other Clinician: Tera Partridge Referring Neven Fina: Treating Claborn Janusz/Extender: Edwena Blow in Treatment: 4 Wound Status Wound Number: 1 Primary Etiology: Lesion Wound Location: Left, Anterior Lower Leg Wound Status: Open Wounding Event: Surgical Injury Date Acquired: 01/04/2022 Weeks Of Treatment: 4 Clustered Wound: No Photos Wound Measurements Length: (cm) 5.4 Width: (cm) 2.9 Depth: (cm) 0.2 Area: (cm) 12.299 Volume: (cm) 2.46 % Reduction in Area: 53.3% % Reduction in Volume: 93.8% Epithelialization: Medium (34-66%) Tunneling: No Undermining: No Wound Description Classification: Full Thickness Without Exposed Support Structures Wound Margin: Distinct, outline attached Exudate Amount: Medium Exudate Type: Serosanguineous Exudate Color: red, brown Foul Odor After Cleansing: No Slough/Fibrino Yes Wound Bed Granulation Amount: Large (67-100%) Exposed Structure Granulation Quality: Red, Hyper-granulation Fascia Exposed: No Necrotic Amount: Small (1-33%) Fat Layer (Subcutaneous Tissue) Exposed: Yes Necrotic Quality: Adherent Slough Tendon Exposed: No Muscle Exposed: No Joint Exposed: No Bone Exposed: No Treatment Notes Wound #1  (Lower Leg) Wound Laterality: Left, Anterior Cleanser Soap and Water Discharge Instruction: May shower and wash wound with dial antibacterial soap and water prior to dressing change. Wound Cleanser Discharge Instruction: Cleanse the wound with wound cleanser prior to applying a clean dressing using gauze sponges, not tissue or cotton balls. Peri-Wound Care Topical Primary Dressing Santyl Ointment Discharge Instruction: Apply nickel thick amount to wound bed as instructed Secondary Dressing ABD Pad, 8x10 Discharge Instruction: Apply over primary dressing as directed. Woven Gauze Sponge, Non-Sterile 4x4 in Discharge Instruction: Apply over primary dressing as directed. Secured With Paper Tape, 2x10 (in/yd) Discharge Instruction: Secure dressing with tape as directed. Compression Wrap ThreePress (3 layer compression wrap) Discharge Instruction: Apply three layer compression as directed. Unnaboot w/Calamine, 4x10 (in/yd) Discharge Instruction: Apply Unnaboot only below the knee, Netting Tubular Discharge Instruction: on left leg Compression Stockings Add-Ons Electronic Signature(s) Signed: 02/28/2022 4:46:26 PM By: Adline Peals Entered By: Adline Peals on 02/28/2022 13:04:45 -------------------------------------------------------------------------------- Wound Assessment Details Patient Name: Date of Service: [name], [name] 02/28/2022 12:45 PM Medical Record Number: [id number] Patient Account Number: [id number] Date of Birth/Sex: Treating RN: 11-25-1995 (26 y.o. [name] Primary Care Kimela Malstrom: Tera Partridge Other Clinician: Referring Amardeep Beckers: Treating Angeles Zehner/Extender: Edwena Blow in Treatment: 4 Wound Status Wound Number: 3 Primary Etiology: Lesion Wound Location: Left, Lateral Lower Leg Wound Status: Open Wounding Event: Surgical Injury Date Acquired: 01/04/2022 Weeks Of Treatment: 4 Clustered Wound:  No Photos Wound Measurements Length: (cm) 1.9 Width: (cm) 0.7 Depth: (cm) 0.1 Area: (cm) 1.045 Volume: (cm) 0.104 % Reduction in Area: 86.7% % Reduction in Volume: 98.1% Epithelialization: Medium (34-66%) Tunneling: No Undermining: No Wound Description Classification: Full Thickness Without Exposed Support Structures Wound Margin: Distinct, outline attached Exudate Amount: Medium Exudate Type: Serosanguineous Exudate Color: red, brown Wound Bed Granulation Amount: Large (67-100%) Granulation Quality: Red Necrotic Amount: None Present (0%) Foul Odor After Cleansing: No Slough/Fibrino No Exposed Structure Fascia Exposed: No Fat Layer (Subcutaneous Tissue) Exposed: Yes Tendon Exposed: No Muscle Exposed: No Joint Exposed: No Bone Exposed: No  Treatment Notes Wound #3 (Lower Leg) Wound Laterality: Left, Lateral Cleanser Soap and Water Discharge Instruction: May shower and wash wound with dial antibacterial soap and water prior to dressing change. Wound Cleanser Discharge Instruction: Cleanse the wound with wound cleanser prior to applying a clean dressing using gauze sponges, not tissue or cotton balls. Peri-Wound Care Topical Primary Dressing KerraCel Ag Gelling Fiber Dressing, 2x2 in (silver alginate) Discharge Instruction: Apply silver alginate to wound bed as instructed Secondary Dressing Woven Gauze Sponge, Non-Sterile 4x4 in Discharge Instruction: Apply over primary dressing as directed. Secured With Paper Tape, 2x10 (in/yd) Discharge Instruction: Secure dressing with tape as directed. Compression Wrap ThreePress (3 layer compression wrap) Discharge Instruction: Apply three layer compression as directed. Unnaboot w/Calamine, 4x10 (in/yd) Discharge Instruction: Apply Unnaboot only below the knee. Compression Stockings Add-Ons Electronic Signature(s) Signed: 02/28/2022 4:46:26 PM By: Adline Peals Entered By: Adline Peals on 02/28/2022  13:05:18 -------------------------------------------------------------------------------- Vitals Details Patient Name: Date of Service: [name], [name] 02/28/2022 12:45 PM Medical Record Number: [id number] Patient Account Number: [id number] Date of Birth/Sex: Treating RN: 1995-09-16 (26 y.o. [name] Primary Care Titan Karner: Tera Partridge Other Clinician: Referring Madisyn Mawhinney: Treating Valerie Cones/Extender: Edwena Blow in Treatment: 4 Vital Signs Time Taken: 12:58 Temperature (F): 98.7 Height (in): 71 Pulse (bpm): 83 Weight (lbs): 300 Respiratory Rate (breaths/min): 16 Body Mass Index (BMI): 41.8 Blood Pressure (mmHg): 140/88 Reference Range: 80 - 120 mg / dl Electronic Signature(s) Signed: 02/28/2022 4:46:26 PM By: Adline Peals Entered By: Adline Peals on 02/28/2022 12:58:54

## 2022-03-11 ENCOUNTER — Encounter (HOSPITAL_BASED_OUTPATIENT_CLINIC_OR_DEPARTMENT_OTHER): Payer: MEDICAID | Admitting: General Surgery

## 2022-03-14 ENCOUNTER — Encounter (HOSPITAL_BASED_OUTPATIENT_CLINIC_OR_DEPARTMENT_OTHER): Payer: 59 | Attending: General Surgery | Admitting: General Surgery

## 2022-03-14 DIAGNOSIS — L97822 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sed: Secondary | ICD-10-CM | POA: Insufficient documentation

## 2022-03-14 DIAGNOSIS — Z6841 Body Mass Index (BMI) 40.0 and over, adult: Secondary | ICD-10-CM | POA: Diagnosis not present

## 2022-03-14 NOTE — Progress Notes (Signed)
[name] ([id number]) [id number] Page 1 of 8 Visit Report for 03/14/2022 Arrival Information Details Patient Name: Date of Service: [name], [place] Houston 03/14/2022 10:00 [place] Record Number: [id number] Patient Account Number: [id number] Date of Birth/Sex: Treating RN: 08-12-1995 (26 y.o. [name] Primary Care Moritz Lever: Tera Partridge Other Clinician: Referring Gabriellah Rabel: Treating Seena Face/Extender: Edwena Blow in Treatment: 6 Visit Information History Since Last Visit Added or deleted any medications: No Patient Arrived: Ambulatory Any new allergies or adverse reactions: No Arrival Time: 10:16 Had a fall or experienced change in No Accompanied By: self activities of daily living that may affect Transfer Assistance: None risk of falls: Patient Identification Verified: Yes Signs or symptoms of abuse/neglect since last visito No Secondary Verification Process Completed: Yes Hospitalized since last visit: No Patient Requires Transmission-Based Precautions: No Implantable device outside of the clinic excluding No Patient Has Alerts: No cellular tissue based products placed in the center since last visit: Has Dressing in Place as Prescribed: Yes Has Compression in Place as Prescribed: No Pain Present Now: Yes Electronic Signature(s) Signed: 03/14/2022 4:21:18 PM By: Adline Peals Entered By: Adline Peals on 03/14/2022 10:17:29 -------------------------------------------------------------------------------- Encounter Discharge Information Details Patient Name: Date of Service: [name], [name] 03/14/2022 10:00 A M Medical Record Number: [id number] Patient Account Number: [id number] Date of Birth/Sex: Treating RN: [birth year] (26 y.o. [name] Primary Care Havah Ammon: Tera Partridge Other Clinician: Referring Akul Leggette: Treating Taylinn Brabant/Extender: Edwena Blow in Treatment: 6 Encounter Discharge Information Items Post Procedure Vitals Discharge Condition: Stable Temperature (F): 98.5 Ambulatory Status: Ambulatory Pulse (bpm): 98 Discharge Destination: Home Respiratory Rate (breaths/min): 16 Transportation: Private Auto Blood Pressure (mmHg): 111/87 Accompanied By: self Schedule Follow-up Appointment: Yes Clinical Summary of Care: Patient Declined Electronic Signature(s) Signed: 03/14/2022 4:21:18 PM By: Adline Peals Entered By: Adline Peals on 03/14/2022 11:12:48 [name] ([id number]) [id number] Page 2 of 8 -------------------------------------------------------------------------------- Lower Extremity Assessment Details Patient Name: Date of Service: [name], [name] 03/14/2022 10:00 A M Medical Record Number: [id number] Patient Account Number: [id number] Date of Birth/Sex: Treating RN: 05/27/1996 (26 y.o. [name] Primary Care Cleve Paolillo: Tera Partridge Other Clinician: Referring Mechell Girgis: Treating Zyona Pettaway/Extender: Edwena Blow in Treatment: 6 Edema Assessment Assessed: [name]: No] [name]: No] [Left: Edema] [Right: :] Calf Left: Right: Point of Measurement: From Medial Instep 48.3 cm Ankle Left: Right: Point of Measurement: From Medial Instep 28.4 cm Vascular Assessment Pulses: Dorsalis Pedis Palpable: [Left:Yes] Electronic Signature(s) Signed: 03/14/2022 4:21:18 PM By: Adline Peals Entered By: Adline Peals on 03/14/2022 10:21:02 -------------------------------------------------------------------------------- Multi Wound Chart Details Patient Name: Date of Service: [name], [name] 03/14/2022 10:00 A M Medical Record Number: [id number] Patient Account Number: [id number] Date of Birth/Sex: Treating RN: May 18, 1996 (26 y.o. M) Primary Care Caige Almeda: Tera Partridge Other Clinician: Referring Meriel Kelliher: Treating  Ariyan Sinnett/Extender: Edwena Blow in Treatment: 6 Vital Signs Height(in): 71 Pulse(bpm): 96 Weight(lbs): 300 Blood Pressure(mmHg): 111/87 Body Mass Index(BMI): 41.8 Temperature(F): 98.5 Respiratory Rate(breaths/min): 16 [1:Photos:] [N/A:N/A [id number] Page 3 of 8] Left, Anterior Lower Leg Left, Lateral Lower Leg N/A Wound Location: Surgical Injury Surgical Injury N/A Wounding Event: Lesion Lesion N/A Primary Etiology: 01/04/2022 01/04/2022 N/A Date Acquired: 6 6 N/A Weeks of Treatment: Open Open N/A Wound Status: No No N/A Wound Recurrence: 4.4x2x0.1 0.1x0.1x0.1 N/A Measurements L x W x D (cm) 6.912 0.008 N/A A (cm) : rea 0.691 0.001 N/A Volume (cm) : 73.80% 99.90% N/A % Reduction in A rea: 98.30% 100.00% N/A %  Reduction in Volume: Full Thickness Without Exposed Full Thickness Without Exposed N/A Classification: Support Structures Support Structures Medium Medium N/A Exudate A mount: Serosanguineous Serosanguineous N/A Exudate Type: red, brown red, brown N/A Exudate Color: Distinct, outline attached Flat and Intact N/A Wound Margin: Large (67-100%) Small (1-33%) N/A Granulation A mount: Red, Hyper-granulation Red N/A Granulation Quality: Small (1-33%) Large (67-100%) N/A Necrotic A mount: Eschar, Adherent Slough Eschar N/A Necrotic Tissue: Fat Layer (Subcutaneous Tissue): Yes Fat Layer (Subcutaneous Tissue): Yes N/A Exposed Structures: Fascia: No Fascia: No Tendon: No Tendon: No Muscle: No Muscle: No Joint: No Joint: No Bone: No Bone: No Medium (34-66%) Large (67-100%) N/A Epithelialization: Debridement - Excisional Debridement - Selective/Open Wound N/A Debridement: Pre-procedure Verification/Time Out 10:29 10:29 N/A Taken: Lidocaine 4% Topical Solution Lidocaine 4% Topical Solution N/A Pain Control: Necrotic/Eschar, Subcutaneous, Necrotic/Eschar N/A Tissue  Debrided: Slough Skin/Subcutaneous Tissue Non-Viable Tissue N/A Level: 8.8 0.01 N/A Debridement A (sq cm): rea Curette Curette N/A Instrument: Minimum Minimum N/A Bleeding: Pressure Pressure N/A Hemostasis Achieved: 0 0 N/A Procedural Pain: 0 0 N/A Post Procedural Pain: Debridement Treatment Response: Procedure was tolerated well Procedure was tolerated well N/A Post Debridement Measurements L x 4.4x2x0.1 0.1x0.1x0.1 N/A W x D (cm) 0.691 0.001 N/A Post Debridement Volume: (cm) No Abnormalities Noted Scarring: Yes N/A Periwound Skin Texture: No Abnormalities Noted Dry/Scaly: Yes N/A Periwound Skin Moisture: Rubor: Yes No Abnormalities Noted N/A Periwound Skin Color: No Abnormality No Abnormality N/A Temperature: Debridement Debridement N/A Procedures Performed: Treatment Notes Electronic Signature(s) Signed: 03/14/2022 10:34:45 AM By: [name] MD FACS Entered By: [name] on 03/14/2022 10:34:45 -------------------------------------------------------------------------------- Multi-Disciplinary Care Plan Details Patient Name: Date of Service: Kyle [name], [name] 03/14/2022 10:00 A M Medical Record Number: [id number] Patient Account Number: [id number] Date of Birth/Sex: Treating RN: 07-11-1995 (26 y.o. [name] Primary Care Zakariah Urwin: Tera Partridge Other Clinician: Referring Anye Brose: Treating Antanette Richwine/Extender: Edwena Blow in Treatment: [address] [name], [name] ([id number]) [id number] Page 4 of 8 Wound/Skin Impairment Nursing Diagnoses: Knowledge deficit related to ulceration/compromised skin integrity Goals: Patient/caregiver will verbalize understanding of skin care regimen Date Initiated: 01/28/2022 Target Resolution Date: 06/05/2022 Goal Status: Active Interventions: Assess ulceration(s) every visit Treatment Activities: Skin care regimen initiated :  01/28/2022 Notes: Electronic Signature(s) Signed: 03/14/2022 4:21:18 PM By: Adline Peals Entered By: Adline Peals on 03/14/2022 10:28:19 -------------------------------------------------------------------------------- Pain Assessment Details Patient Name: Date of Service: [name], [name] 03/14/2022 10:00 [place] Record Number: [id number] Patient Account Number: [id number] Date of Birth/Sex: Treating RN: 1996/04/19 (26 y.o. [name] Primary Care Svetlana Bagby: Tera Partridge Other Clinician: Referring Anyssa Sharpless: Treating Debra Calabretta/Extender: Edwena Blow in Treatment: 6 Active Problems Location of Pain Severity and Description of Pain Patient Has Paino Yes Site Locations Pain Location: Pain in Ulcers Duration of the Pain. Constant / Intermittento Intermittent Rate the pain. Current Pain Level: 3 Character of Pain Describe the Pain: Burning Pain Management and Medication Current Pain Management: Electronic Signature(s) Signed: 03/14/2022 4:21:18 PM By: Adline Peals Entered By: Adline Peals on 03/14/2022 10:18:03 [name] ([id number]) [id number] Page 5 of 8 -------------------------------------------------------------------------------- Patient/Caregiver Education Details Patient Name: Date of Service: [name], [place] Houston 10/9/2023andnbsp10:00 A M Medical Record Number: [id number] Patient Account Number: [id number] Date of Birth/Gender: Treating RN: [birth year] (26 y.o. [name] Primary Care Physician: Tera Partridge Other Clinician: Referring Physician: Treating Physician/Extender: Edwena Blow in Treatment: 6 Education Assessment Education Provided To: Patient Education Topics Provided Wound/Skin Impairment: Methods: Explain/Verbal Responses: Reinforcements needed,  State content correctly Electronic Signature(s) Signed: 03/14/2022  4:21:18 PM By: Adline Peals Entered By: Adline Peals on 03/14/2022 10:28:29 -------------------------------------------------------------------------------- Wound Assessment Details Patient Name: Date of Service: [name], [name] 03/14/2022 10:00 A M Medical Record Number: [id number] Patient Account Number: [id number] Date of Birth/Sex: Treating RN: Aug 30, 1995 (26 y.o. [name] Primary Care Ladarrius Bogdanski: Tera Partridge Other Clinician: Referring Mayley Lish: Treating Kieth Hartis/Extender: Edwena Blow in Treatment: 6 Wound Status Wound Number: 1 Primary Etiology: Lesion Wound Location: Left, Anterior Lower Leg Wound Status: Open Wounding Event: Surgical Injury Date Acquired: 01/04/2022 Weeks Of Treatment: 6 Clustered Wound: No Photos Wound Measurements Length: (cm) 4.4 Width: (cm) 2 Houston, Kyle ([id number]) Depth: (cm) 0.1 Area: (cm) 6.912 Volume: (cm) 0.691 % Reduction in Area: 73.8% % Reduction in Volume: 98.3% [id number] Page 6 of 8 Epithelialization: Medium (34-66%) Tunneling: No Undermining: No Wound Description Classification: Full Thickness Without Exposed Support Structures Wound Margin: Distinct, outline attached Exudate Amount: Medium Exudate Type: Serosanguineous Exudate Color: red, brown Foul Odor After Cleansing: No Slough/Fibrino Yes Wound Bed Granulation Amount: Large (67-100%) Exposed Structure Granulation Quality: Red, Hyper-granulation Fascia Exposed: No Necrotic Amount: Small (1-33%) Fat Layer (Subcutaneous Tissue) Exposed: Yes Necrotic Quality: Eschar, Adherent Slough Tendon Exposed: No Muscle Exposed: No Joint Exposed: No Bone Exposed: No Periwound Skin Texture Texture Color No Abnormalities Noted: Yes No Abnormalities Noted: No Rubor: Yes Moisture No Abnormalities Noted: Yes Temperature / Pain Temperature: No Abnormality Treatment Notes Wound #1 (Lower Leg)  Wound Laterality: Left, Anterior Cleanser Soap and Water Discharge Instruction: May shower and wash wound with dial antibacterial soap and water prior to dressing change. Wound Cleanser Discharge Instruction: Cleanse the wound with wound cleanser prior to applying a clean dressing using gauze sponges, not tissue or cotton balls. Peri-Wound Care Topical Primary Dressing KerraCel Ag Gelling Fiber Dressing, 2x2 in (silver alginate) Discharge Instruction: Apply silver alginate to wound bed as instructed Secondary Dressing ABD Pad, 8x10 Discharge Instruction: Apply over primary dressing as directed. Woven Gauze Sponge, Non-Sterile 4x4 in Discharge Instruction: Apply over primary dressing as directed. Secured With Paper Tape, 2x10 (in/yd) Discharge Instruction: Secure dressing with tape as directed. Compression Wrap ThreePress (3 layer compression wrap) Discharge Instruction: Apply three layer compression as directed. Unnaboot w/Calamine, 4x10 (in/yd) Discharge Instruction: Apply Unnaboot only below the knee, Netting Tubular Discharge Instruction: on left leg Compression Stockings Add-Ons Electronic Signature(s) Signed: 03/14/2022 4:21:18 PM By: Adline Peals Entered By: Adline Peals on 03/14/2022 10:25:01 [name] ([id number]) [id number] Page 7 of 8 -------------------------------------------------------------------------------- Wound Assessment Details Patient Name: Date of Service: [name], [name] 03/14/2022 10:00 A M Medical Record Number: [id number] Patient Account Number: [id number] Date of Birth/Sex: Treating RN: 1996/03/15 (26 y.o. [name] Primary Care Yarethzi Branan: Tera Partridge Other Clinician: Referring Bethzy Hauck: Treating Luisantonio Adinolfi/Extender: Edwena Blow in Treatment: 6 Wound Status Wound Number: 3 Primary Etiology: Lesion Wound Location: Left, Lateral Lower Leg Wound Status:  Open Wounding Event: Surgical Injury Date Acquired: 01/04/2022 Weeks Of Treatment: 6 Clustered Wound: No Photos Wound Measurements Length: (cm) 0. Width: (cm) 0. Depth: (cm) 0. Area: (cm) 0 Volume: (cm) 0 1 % Reduction in Area: 99.9% 1 % Reduction in Volume: 100% 1 Epithelialization: Large (67-100%) .008 Tunneling: No .001 Undermining: No Wound Description Classification: Full Thickness Without Exposed Support Wound Margin: Flat and Intact Exudate Amount: Medium Exudate Type: Serosanguineous Exudate Color: red, brown Structures Foul Odor After Cleansing: No Slough/Fibrino No Wound Bed Granulation Amount: Small (1-33%) Exposed Structure Granulation Quality: Red Fascia  Exposed: No Necrotic Amount: Large (67-100%) Fat Layer (Subcutaneous Tissue) Exposed: Yes Necrotic Quality: Eschar Tendon Exposed: No Muscle Exposed: No Joint Exposed: No Bone Exposed: No Periwound Skin Texture Texture Color No Abnormalities Noted: No No Abnormalities Noted: Yes Scarring: Yes Temperature / Pain Temperature: No Abnormality Moisture No Abnormalities Noted: No Dry / Scaly: Yes Treatment Notes Wound #3 (Lower Leg) Wound Laterality: Left, Lateral Cleanser [name], [name] ([id number]) [id number] Page 8 of 8 Soap and Water Discharge Instruction: May shower and wash wound with dial antibacterial soap and water prior to dressing change. Wound Cleanser Discharge Instruction: Cleanse the wound with wound cleanser prior to applying a clean dressing using gauze sponges, not tissue or cotton balls. Peri-Wound Care Topical Primary Dressing KerraCel Ag Gelling Fiber Dressing, 2x2 in (silver alginate) Discharge Instruction: Apply silver alginate to wound bed as instructed Secondary Dressing Woven Gauze Sponge, Non-Sterile 4x4 in Discharge Instruction: Apply over primary dressing as directed. Secured With Paper Tape, 2x10 (in/yd) Discharge Instruction: Secure  dressing with tape as directed. Compression Wrap ThreePress (3 layer compression wrap) Discharge Instruction: Apply three layer compression as directed. Unnaboot w/Calamine, 4x10 (in/yd) Discharge Instruction: Apply Unnaboot only below the knee. Compression Stockings Add-Ons Electronic Signature(s) Signed: 03/14/2022 4:21:18 PM By: Adline Peals Entered By: Adline Peals on 03/14/2022 10:30:02 -------------------------------------------------------------------------------- Vitals Details Patient Name: Date of Service: Kyle [name], [name] 03/14/2022 10:00 A M Medical Record Number: [id number] Patient Account Number: [id number] Date of Birth/Sex: Treating RN: 1995-07-09 (26 y.o. [name] Primary Care Dexter Signor: Tera Partridge Other Clinician: Referring [name]: Treating Drue Camera/Extender: Edwena Blow in Treatment: 6 Vital Signs Time Taken: 10:18 Temperature (F): 98.5 Height (in): 71 Pulse (bpm): 96 Weight (lbs): 300 Respiratory Rate (breaths/min): 16 Body Mass Index (BMI): 41.8 Blood Pressure (mmHg): 111/87 Reference Range: 80 - 120 mg / dl Electronic Signature(s) Signed: 03/14/2022 4:21:18 PM By: Adline Peals Entered By: Adline Peals on 03/14/2022 10:18:46

## 2022-03-14 NOTE — Progress Notes (Signed)
ANNIE, ROSEBOOM ([id number]) [id number] Page 1 of 9 Visit Report for 03/14/2022 Chief Complaint Document Details Patient Name: Date of Service: [name], [place] 03/14/2022 10:00 A M Medical Record Number: [id number] Patient Account Number: [id number] Date of Birth/Sex: Treating RN: [birth year] (26 y.o. M) Primary Care Provider: Oralia Manis Other Clinician: Referring Provider: Treating Provider/Extender: Levi Aland in Treatment: 6 Information Obtained from: Patient Chief Complaint Patient presents to the wound care center with open non-healing surgical wound(s) Electronic Signature(s) Signed: 03/14/2022 10:34:52 AM By: Duanne Guess MD FACS Entered By: Duanne Guess on 03/14/2022 10:34:52 -------------------------------------------------------------------------------- Debridement Details Patient Name: Date of Service: [name], [name] 03/14/2022 10:00 A M Medical Record Number: [id number] Patient Account Number: [id number] Date of Birth/Sex: Treating RN: Jan 10, 1996 (26 y.o. [name] Primary Care Provider: Oralia Manis Other Clinician: Referring Provider: Treating Provider/Extender: Levi Aland in Treatment: 6 Debridement Performed for Assessment: Wound #3 Left,Lateral Lower Leg Performed By: Physician Duanne Guess, MD Debridement Type: Debridement Level of Consciousness (Pre-procedure): Awake and Alert Pre-procedure Verification/Time Out Yes - 10:29 Taken: Start Time: 10:29 Pain Control: Lidocaine 4% T opical Solution T Area Debrided (L x W): otal 0.1 (cm) x 0.1 (cm) = 0.01 (cm) Tissue and other material debrided: Non-Viable, Eschar Level: Non-Viable Tissue Debridement Description: Selective/Open Wound Instrument: Curette Bleeding: Minimum Hemostasis Achieved: Pressure Procedural Pain: 0 Post Procedural Pain: 0 Response to Treatment: Procedure was  tolerated well Level of Consciousness (Post- Awake and Alert procedure): Post Debridement Measurements of Total Wound Length: (cm) 0.1 Width: (cm) 0.1 Depth: (cm) 0.1 Volume: (cm) 0.001 Character of Wound/Ulcer Post Debridement: Improved Post Procedure Diagnosis Same as Pre-procedure [name] ([id number]) [id number] Page 2 of 9 Notes scribed for Dr. Lady Gary by Samuella Bruin, RN Electronic Signature(s) Signed: 03/14/2022 12:20:31 PM By: Duanne Guess MD FACS Signed: 03/14/2022 4:21:18 PM By: Samuella Bruin Entered By: Samuella Bruin on 03/14/2022 10:31:37 -------------------------------------------------------------------------------- Debridement Details Patient Name: Date of Service: [name], [name] 03/14/2022 10:00 A M Medical Record Number: [id number] Patient Account Number: [id number] Date of Birth/Sex: Treating RN: 1995/08/09 (26 y.o. [name] Primary Care Provider: Oralia Manis Other Clinician: Referring Provider: Treating Provider/Extender: Levi Aland in Treatment: 6 Debridement Performed for Assessment: Wound #1 Left,Anterior Lower Leg Performed By: Physician Duanne Guess, MD Debridement Type: Debridement Level of Consciousness (Pre-procedure): Awake and Alert Pre-procedure Verification/Time Out Yes - 10:29 Taken: Start Time: 10:29 Pain Control: Lidocaine 4% T opical Solution T Area Debrided (L x W): otal 4.4 (cm) x 2 (cm) = 8.8 (cm) Tissue and other material debrided: Non-Viable, Eschar, Slough, Subcutaneous, Slough Level: Skin/Subcutaneous Tissue Debridement Description: Excisional Instrument: Curette Bleeding: Minimum Hemostasis Achieved: Pressure Procedural Pain: 0 Post Procedural Pain: 0 Response to Treatment: Procedure was tolerated well Level of Consciousness (Post- Awake and Alert procedure): Post Debridement Measurements of Total Wound Length: (cm)  4.4 Width: (cm) 2 Depth: (cm) 0.1 Volume: (cm) 0.691 Character of Wound/Ulcer Post Debridement: Improved Post Procedure Diagnosis Same as Pre-procedure Notes scribed for Dr. Lady Gary by Samuella Bruin, RN Electronic Signature(s) Signed: 03/14/2022 12:20:31 PM By: Duanne Guess MD FACS Signed: 03/14/2022 4:21:18 PM By: Samuella Bruin Entered By: Samuella Bruin on 03/14/2022 10:31:44 HPI Details -------------------------------------------------------------------------------- [name] ([id number]) [id number] Page 3 of 9 Patient Name: Date of Service: [name], [name] 03/14/2022 10:00 A M Medical Record Number: [id number] Patient Account Number: [id number] Date of Birth/Sex: Treating RN: 01-23-1996 (26 y.o. 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 He was in [place] and apparently was hospitalized for dehydration and possible sepsis. We do not have any records from his hospitalization, but at some point he underwent surgery and has 3 open wounds. He reports that they did this to "take cultures." Again, no records are available to know if this was done, what those results were, or what antibiotics he was given. He is now here today for further evaluation and management of 3 left lower extremity wounds. He has a wound just caudal to his knee on the lateral aspect of his leg, another on his lateral calf, and a third on his anterior tibial surface. All of them have slough. The anterior tibial surface wound is the largest and also has some necrotic fat present. There is periwound erythema and tenderness. The placement of the incisions does not correlate with 4 compartment fasciotomy so I  am not entirely sure what the thought process of the surgeons in [place] was or what specifically they were operating for. Hopefully we will receive those documents before his next visit. 02/08/2022: We have not received any information from [place]. His wounds are little bit smaller today, but still have a fair amount of slough as well as some nonviable subcutaneous tissue present. The culture that I took last week returned positive only for very low levels of methicillin sensitive Staph aureus, nonpathologic levels. 02/18/2022: The left lateral knee wound is healed. There is just a bit of eschar on the surface that once removed, revealed complete epithelialization. The more proximal and lateral lower leg wound is quite a bit smaller with just a little bit of slough and some good robust looking granulation tissue. The largest of the wounds on the anterior tibia has a fair amount of slough buildup but has contracted quite a bit since our last visit. 02/28/2022: The more proximal lateral leg wound is flush with the surrounding skin surface and has epithelialized significantly. There is just a small open portion with a light layer of slough present. The large anterior tibial wound is much cleaner today, but still has some slough and nonviable subcutaneous tissue present. 03/14/2022: The lateral leg wound is nearly closed; there is just a small superficial opening under a layer of eschar. The large anterior tibial wound has contracted considerably. There is just a bit of slough buildup on the surface, but nowhere near to the degree as seen on previous occasions. Electronic Signature(s) Signed: 03/14/2022 10:35:49 AM By: Duanne Guess MD FACS Entered By: Duanne Guess on 03/14/2022 10:35:49 -------------------------------------------------------------------------------- Physical Exam Details Patient Name: Date of Service: [name], [name] 03/14/2022 10:00 A M Medical Record Number:  [id number] Patient Account Number: [id number] Date of Birth/Sex: Treating RN: [birth year] (26 y.o. M) Primary Care Provider: Oralia Manis Other Clinician: Referring Provider: Treating Provider/Extender: Levi Aland in Treatment: 6 Constitutional . . . . No acute distress.Marland Kitchen Respiratory Normal work of breathing on room air.. Notes 03/14/2022: The lateral leg wound is nearly closed; there is just a small superficial opening under a layer of eschar. The large anterior tibial wound has contracted considerably. There is just a bit of slough buildup on the surface, but nowhere near to the degree as seen on previous occasions. Electronic Signature(s) Signed: 03/14/2022 10:37:07 AM By: Duanne Guess MD FACS Entered By: Duanne Guess on  03/14/2022 10:37:07 [name], [name] ([id number]) [id number] Page 4 of 9 -------------------------------------------------------------------------------- Physician Orders Details Patient Name: Date of Service: [name], [name] 03/14/2022 10:00 A M Medical Record Number: [id number] Patient Account Number: [id number] Date of Birth/Sex: Treating RN: Nov 30, 1995 (26 y.o. [name] Primary Care Provider: Tera Partridge Other Clinician: Referring Provider: Treating Provider/Extender: Edwena Blow in Treatment: 6 Verbal / Phone Orders: No Diagnosis Coding ICD-10 Coding Code Description [phone] 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sed E66.01 Morbid (severe) obesity due to excess calories Follow-up Appointments ppointment in 1 week. - Dr [name] - Room 2 Return A Anesthetic Wound #1 Left,Anterior Lower Leg (In clinic) Topical Lidocaine 4% applied to wound bed Wound #3 Left,Lateral Lower Leg (In clinic) Topical Lidocaine 4% applied to wound bed Bathing/ Shower/ Hygiene May shower with protection but do not get wound  dressing(s) wet. - Please do not get wraps on left leg wet-use a cast protector to cover leg when bathing Edema Control - Lymphedema / SCD / Other Elevate legs to the level of the heart or above for 30 minutes daily and/or when sitting, a frequency of: Avoid standing for long periods of time. Wound Treatment Wound #1 - Lower Leg Wound Laterality: Left, Anterior Cleanser: Soap and Water 1 x Per Week/30 Days Discharge Instructions: May shower and wash wound with dial antibacterial soap and water prior to dressing change. Cleanser: Wound Cleanser (Generic) 1 x Per Week/30 Days Discharge Instructions: Cleanse the wound with wound cleanser prior to applying a clean dressing using gauze sponges, not tissue or cotton balls. Prim Dressing: KerraCel Ag Gelling Fiber Dressing, 2x2 in (silver alginate) 1 x Per Week/30 Days ary Discharge Instructions: Apply silver alginate to wound bed as instructed Secondary Dressing: ABD Pad, 8x10 1 x Per Week/30 Days Discharge Instructions: Apply over primary dressing as directed. Secondary Dressing: Woven Gauze Sponge, Non-Sterile 4x4 in 1 x Per Week/30 Days Discharge Instructions: Apply over primary dressing as directed. Secured With: Paper Tape, 2x10 (in/yd) 1 x Per Week/30 Days Discharge Instructions: Secure dressing with tape as directed. Compression Wrap: ThreePress (3 layer compression wrap) 1 x Per Week/30 Days Discharge Instructions: Apply three layer compression as directed. Compression Wrap: Unnaboot w/Calamine, 4x10 (in/yd) 1 x Per Week/30 Days Discharge Instructions: Apply Unnaboot only below the knee, Compression Wrap: Netting Tubular 1 x Per Week/30 Days Discharge Instructions: on left leg Wound #3 - Lower Leg Wound Laterality: Left, Lateral Cleanser: Soap and Water 1 x Per Day/30 Days Discharge Instructions: May shower and wash wound with dial antibacterial soap and water prior to dressing change. Cleanser: Wound Cleanser (Generic) 1 x Per Day/30  Days Discharge Instructions: Cleanse the wound with wound cleanser prior to applying a clean dressing using gauze sponges, not tissue or cotton balls. [name], [name] ([id number]) [id number] Page 5 of 9 Prim Dressing: KerraCel Ag Gelling Fiber Dressing, 2x2 in (silver alginate) 1 x Per Day/30 Days ary Discharge Instructions: Apply silver alginate to wound bed as instructed Secondary Dressing: Woven Gauze Sponge, Non-Sterile 4x4 in (Generic) 1 x Per Day/30 Days Discharge Instructions: Apply over primary dressing as directed. Secured With: Paper Tape, 2x10 (in/yd) (Generic) 1 x Per Day/30 Days Discharge Instructions: Secure dressing with tape as directed. Compression Wrap: ThreePress (3 layer compression wrap) 1 x Per Day/30 Days Discharge Instructions: Apply three layer compression as directed. Compression Wrap: Unnaboot w/Calamine, 4x10 (in/yd) 1 x Per Day/30 Days Discharge Instructions: Apply Unnaboot only below the knee. Patient  Medications llergies: No Known Allergies A Notifications Medication Indication Start End 03/14/2022 lidocaine DOSE topical 4 % cream - cream topical Electronic Signature(s) Signed: 03/14/2022 12:20:31 PM By: Duanne Guess MD FACS Entered By: Duanne Guess on 03/14/2022 10:37:19 -------------------------------------------------------------------------------- Problem List Details Patient Name: Date of Service: [name], [name] 03/14/2022 10:00 A M Medical Record Number: [id number] Patient Account Number: [id number] Date of Birth/Sex: Treating RN: [birth year] (26 y.o. M) Primary Care Provider: Oralia Manis Other Clinician: Referring Provider: Treating Provider/Extender: Levi Aland in Treatment: 6 Active Problems ICD-10 Encounter Code Description Active Date MDM Diagnosis [phone] 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sed8/25/2023 No Yes E66.01  Morbid (severe) obesity due to excess calories 01/28/2022 No Yes Inactive Problems Resolved Problems Electronic Signature(s) Signed: 03/14/2022 10:34:37 AM By: Duanne Guess MD FACS Entered By: Duanne Guess on 03/14/2022 10:34:37 [name] ([id number]) [id number] Page 6 of 9 -------------------------------------------------------------------------------- Progress Note Details Patient Name: Date of Service: [name], [name] 03/14/2022 10:00 A M Medical Record Number: [id number] Patient Account Number: [id number] Date of Birth/Sex: Treating RN: 1996/01/21 (26 y.o. 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 He was in [place] and apparently was hospitalized for dehydration and possible sepsis. We do not have any records from his hospitalization, but at some point he underwent surgery and has 3 open wounds. He reports that they did this to "take cultures." Again, no records are available to know if this was done, what those results were, or what antibiotics he was given. He is now here today for further evaluation and management of 3 left lower extremity wounds. He has a wound just caudal to his knee on the lateral aspect of his leg, another on his lateral calf, and a third on his anterior tibial surface. All of them have slough. The anterior tibial surface wound is the largest and also has some necrotic fat present. There is periwound erythema and tenderness. The placement of the incisions does not correlate with 4 compartment fasciotomy so I am not entirely sure  what the thought process of the surgeons in [place] was or what specifically they were operating for. Hopefully we will receive those documents before his next visit. 02/08/2022: We have not received any information from [place]. His wounds are little bit smaller today, but still have a fair amount of slough as well as some nonviable subcutaneous tissue present. The culture that I took last week returned positive only for very low levels of methicillin sensitive Staph aureus, nonpathologic levels. 02/18/2022: The left lateral knee wound is healed. There is just a bit of eschar on the surface that once removed, revealed complete epithelialization. The more proximal and lateral lower leg wound is quite a bit smaller with just a little bit of slough and some good robust looking granulation tissue. The largest of the wounds on the anterior tibia has a fair amount of slough buildup but has contracted quite a bit since our last visit. 02/28/2022: The more proximal lateral leg wound is flush with the surrounding skin surface and has epithelialized significantly. There is just a small open portion with a light layer of slough present. The  large anterior tibial wound is much cleaner today, but still has some slough and nonviable subcutaneous tissue present. 03/14/2022: The lateral leg wound is nearly closed; there is just a small superficial opening under a layer of eschar. The large anterior tibial wound has contracted considerably. There is just a bit of slough buildup on the surface, but nowhere near to the degree as seen on previous occasions. Patient History Information obtained from Patient. Social History Former smoker, Alcohol Use - Rarely, Drug Use - No History, Caffeine Use - Moderate. Objective Constitutional No acute distress.. Vitals Time Taken: 10:18 AM, Height: 71 in, Weight: 300 lbs, BMI: 41.8, Temperature: 98.5 F, Pulse: 96 bpm, Respiratory Rate: 16 breaths/min, Blood Pressure: 111/87  mmHg. Respiratory Normal work of breathing on room air.. General Notes: 03/14/2022: The lateral leg wound is nearly closed; there is just a small superficial opening under a layer of eschar. The large anterior tibial wound has contracted considerably. There is just a bit of slough buildup on the surface, but nowhere near to the degree as seen on previous occasions. Integumentary (Hair, Skin) Wound #1 status is Open. Original cause of wound was Surgical Injury. The date acquired was: 01/04/2022. The wound has been in treatment 6 weeks. The wound is located on the Left,Anterior Lower Leg. The wound measures 4.4cm length x 2cm width x 0.1cm depth; 6.912cm^2 area and 0.691cm^3 volume. There is Fat Layer (Subcutaneous Tissue) exposed. There is no tunneling or undermining noted. There is a medium amount of serosanguineous drainage noted. The wound ULYSEES, ROBARTS ([id number]) [id number] Page 7 of 9 margin is distinct with the outline attached to the wound base. There is large (67-100%) red, hyper - granulation within the wound bed. There is a small (1-33%) amount of necrotic tissue within the wound bed including Eschar and Adherent Slough. The periwound skin appearance had no abnormalities noted for texture. The periwound skin appearance had no abnormalities noted for moisture. The periwound skin appearance exhibited: Rubor. Periwound temperature was noted as No Abnormality. Wound #3 status is Open. Original cause of wound was Surgical Injury. The date acquired was: 01/04/2022. The wound has been in treatment 6 weeks. The wound is located on the Left,Lateral Lower Leg. The wound measures 0.1cm length x 0.1cm width x 0.1cm depth; 0.008cm^2 area and 0.001cm^3 volume. There is Fat Layer (Subcutaneous Tissue) exposed. There is no tunneling or undermining noted. There is a medium amount of serosanguineous drainage noted. The wound margin is flat and intact. There is small (1-33%) red  granulation within the wound bed. There is a large (67-100%) amount of necrotic tissue within the wound bed including Eschar. The periwound skin appearance had no abnormalities noted for color. The periwound skin appearance exhibited: Scarring, Dry/Scaly. Periwound temperature was noted as No Abnormality. Assessment Active Problems ICD-10 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sed Morbid (severe) obesity due to excess calories Procedures Wound #1 Pre-procedure diagnosis of Wound #1 is a Lesion located on the Left,Anterior Lower Leg . There was a Excisional Skin/Subcutaneous Tissue Debridement with a total area of 8.8 sq cm performed by Duanne Guess, MD. With the following instrument(s): Curette to remove Non-Viable tissue/material. Material removed includes Eschar, Subcutaneous Tissue, and Slough after achieving pain control using Lidocaine 4% T opical Solution. No specimens were taken. A time out was conducted at 10:29, prior to the start of the procedure. A Minimum amount of bleeding was controlled with Pressure. The procedure was tolerated well with a pain level  of 0 throughout and a pain level of 0 following the procedure. Post Debridement Measurements: 4.4cm length x 2cm width x 0.1cm depth; 0.691cm^3 volume. Character of Wound/Ulcer Post Debridement is improved. Post procedure Diagnosis Wound #1: Same as Pre-Procedure General Notes: scribed for Dr. Lady Gary by Samuella Bruin, RN. Wound #3 Pre-procedure diagnosis of Wound #3 is a Lesion located on the Left,Lateral Lower Leg . There was a Selective/Open Wound Non-Viable Tissue Debridement with a total area of 0.01 sq cm performed by Duanne Guess, MD. With the following instrument(s): Curette to remove Non-Viable tissue/material. Material removed includes Eschar after achieving pain control using Lidocaine 4% T opical Solution. No specimens were taken. A time out was conducted at 10:29,  prior to the start of the procedure. A Minimum amount of bleeding was controlled with Pressure. The procedure was tolerated well with a pain level of 0 throughout and a pain level of 0 following the procedure. Post Debridement Measurements: 0.1cm length x 0.1cm width x 0.1cm depth; 0.001cm^3 volume. Character of Wound/Ulcer Post Debridement is improved. Post procedure Diagnosis Wound #3: Same as Pre-Procedure General Notes: scribed for Dr. Lady Gary by Samuella Bruin, RN. Plan Follow-up Appointments: Return Appointment in 1 week. - Dr Lady Gary - Room 2 Anesthetic: Wound #1 Left,Anterior Lower Leg: (In clinic) Topical Lidocaine 4% applied to wound bed Wound #3 Left,Lateral Lower Leg: (In clinic) Topical Lidocaine 4% applied to wound bed Bathing/ Shower/ Hygiene: May shower with protection but do not get wound dressing(s) wet. - Please do not get wraps on left leg wet-use a cast protector to cover leg when bathing Edema Control - Lymphedema / SCD / Other: Elevate legs to the level of the heart or above for 30 minutes daily and/or when sitting, a frequency of: Avoid standing for long periods of time. The following medication(s) was prescribed: lidocaine topical 4 % cream cream topical was prescribed at facility WOUND #1: - Lower Leg Wound Laterality: Left, Anterior Cleanser: Soap and Water 1 x Per Week/30 Days Discharge Instructions: May shower and wash wound with dial antibacterial soap and water prior to dressing change. Cleanser: Wound Cleanser (Generic) 1 x Per Week/30 Days Discharge Instructions: Cleanse the wound with wound cleanser prior to applying a clean dressing using gauze sponges, not tissue or cotton balls. Prim Dressing: KerraCel Ag Gelling Fiber Dressing, 2x2 in (silver alginate) 1 x Per Week/30 Days ary Discharge Instructions: Apply silver alginate to wound bed as instructed Secondary Dressing: ABD Pad, 8x10 1 x Per Week/30 Days Discharge Instructions: Apply over primary  dressing as directed. Secondary Dressing: Woven Gauze Sponge, Non-Sterile 4x4 in 1 x Per Week/30 Days Discharge Instructions: Apply over primary dressing as directed. Secured With: Paper T ape, 2x10 (in/yd) 1 x Per Week/30 Days Discharge Instructions: Secure dressing with tape as directed. Com pression Wrap: ThreePress (3 layer compression wrap) 1 x Per Week/30 Days Discharge Instructions: Apply three layer compression as directed. Com pression Wrap: Unnaboot w/Calamine, 4x10 (in/yd) 1 x Per Week/30 Days Discharge Instructions: Apply Unnaboot only below the knee, [name], [name] ([id number]) [id number] Page 8 of 9 Com pression Wrap: Netting Tubular 1 x Per Week/30 Days Discharge Instructions: on left leg WOUND #3: - Lower Leg Wound Laterality: Left, Lateral Cleanser: Soap and Water 1 x Per Day/30 Days Discharge Instructions: May shower and wash wound with dial antibacterial soap and water prior to dressing change. Cleanser: Wound Cleanser (Generic) 1 x Per Day/30 Days Discharge Instructions: Cleanse the wound with wound cleanser prior to applying a clean dressing using  gauze sponges, not tissue or cotton balls. Prim Dressing: KerraCel Ag Gelling Fiber Dressing, 2x2 in (silver alginate) 1 x Per Day/30 Days ary Discharge Instructions: Apply silver alginate to wound bed as instructed Secondary Dressing: Woven Gauze Sponge, Non-Sterile 4x4 in (Generic) 1 x Per Day/30 Days Discharge Instructions: Apply over primary dressing as directed. Secured With: Paper T ape, 2x10 (in/yd) (Generic) 1 x Per Day/30 Days Discharge Instructions: Secure dressing with tape as directed. Com pression Wrap: ThreePress (3 layer compression wrap) 1 x Per Day/30 Days Discharge Instructions: Apply three layer compression as directed. Com pression Wrap: Unnaboot w/Calamine, 4x10 (in/yd) 1 x Per Day/30 Days Discharge Instructions: Apply Unnaboot only below the knee. 03/14/2022: The lateral  leg wound is nearly closed; there is just a small superficial opening under a layer of eschar. The large anterior tibial wound has contracted considerably. There is just a bit of slough buildup on the surface, but nowhere near to the degree as seen on previous occasions. I used a curette to debride eschar from the lateral leg wound and slough and nonviable subcutaneous tissue from the anterior wound. I do not think he requires Santyl any longer. We will just use silver alginate and 3 layer compression. Follow-up in 1 week. Electronic Signature(s) Signed: 03/14/2022 10:38:20 AM By: Duanne Guessannon, Sankalp Ferrell MD FACS Entered By: Duanne Guessannon, Jasara Corrigan on 03/14/2022 10:38:19 -------------------------------------------------------------------------------- HxROS Details Patient Name: Date of Service: [name], [name] 03/14/2022 10:00 A M Medical Record Number: [id number] Patient Account Number: [id number] Date of Birth/Sex: Treating RN: 06/20/1995 (26 y.o. M) Primary Care Provider: Oralia ManisKashddan, Daniel Other Clinician: Referring Provider: Treating Provider/Extender: Levi Alandannon, Elana Jian Kashddan, Daniel Weeks in Treatment: 6 Information Obtained From Patient Immunizations Pneumococcal Vaccine: Received Pneumococcal Vaccination: No Implantable Devices None Family and Social History Former smoker; Alcohol Use: Rarely; Drug Use: No History; Caffeine Use: Moderate; Financial Concerns: No; Food, Clothing or Shelter Needs: No; Support System Lacking: No; Transportation Concerns: No Electronic Signature(s) Signed: 03/14/2022 12:20:31 PM By: Duanne Guessannon, Christinamarie Tall MD FACS Entered By: Duanne Guessannon, Margee Trentham on 03/14/2022 10:36:44 Kyle MowHOLLENDER, [name] ([id number]) [id number] Page 9 of 9 -------------------------------------------------------------------------------- SuperBill Details Patient Name: Date of Service: Kyle NgHO Houston, [name] 03/14/2022 Medical Record Number: [id number] Patient Account Number:  [id number] Date of Birth/Sex: Treating RN: 08/13/1995 (26 y.o. 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 Modifier: d Quantity: 1 : CPT4 Code: [id number] Description: 97597 - DEBRIDE WOUND 1ST 20 SQ CM OR < ICD-10 Diagnosis Description 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 Modifier: d Quantity: 1 Physician Procedures : CPT4 Code Description Modifier [id number] 99213 - WC PHYS LEVEL 3 - EST PT 25 ICD-10 Diagnosis Description 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d E66.01 Morbid (severe) obesity due to excess calories Quantity: 1 : [id number] 11042 - WC PHYS SUBQ TISS 20 SQ CM ICD-10 Diagnosis Description 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d Quantity: 1 : [id number] 97597 - WC PHYS DEBR WO ANESTH 20 SQ CM ICD-10 Diagnosis  Description [phone] 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sed Quantity: 1 Electronic Signature(s) Signed: 03/14/2022 10:43:20 AM By: Duanne Guess MD FACS Entered By: Duanne Guess on 03/14/2022 10:43:20

## 2022-03-21 ENCOUNTER — Encounter (HOSPITAL_BASED_OUTPATIENT_CLINIC_OR_DEPARTMENT_OTHER): Payer: 59 | Admitting: General Surgery

## 2022-03-21 DIAGNOSIS — L97822 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sed: Secondary | ICD-10-CM | POA: Diagnosis not present

## 2022-03-21 NOTE — Progress Notes (Signed)
URIAS, SHEEK ([id number]) [id number] Page 1 of 8 Visit Report for 03/21/2022 Arrival Information Details Patient Name: Date of Service: [name], [place] BERT 03/21/2022 10:00 A M Medical Record Number: [id number] Patient Account Number: [id number] Date of Birth/Sex: Treating RN: 08/18/1995 (26 y.o. [name] Primary Care Berda Shelvin: Oralia Manis Other Clinician: Referring Missy Baksh: Treating Kristy Schomburg/Extender: Levi Aland in Treatment: 7 Visit Information History Since Last Visit Added or deleted any medications: No Patient Arrived: Ambulatory Any new allergies or adverse reactions: No Arrival Time: 10:17 Had a fall or experienced change in No Accompanied By: self activities of daily living that may affect Transfer Assistance: None risk of falls: Patient Identification Verified: Yes Signs or symptoms of abuse/neglect since last visito No Secondary Verification Process Completed: Yes Hospitalized since last visit: No Patient Requires Transmission-Based Precautions: No Implantable device outside of the clinic excluding No Patient Has Alerts: No cellular tissue based products placed in the center since last visit: Has Dressing in Place as Prescribed: Yes Has Compression in Place as Prescribed: Yes Pain Present Now: No Electronic Signature(s) Signed: 03/21/2022 2:31:58 PM By: Samuella Bruin Entered By: Samuella Bruin on 03/21/2022 10:18:07 -------------------------------------------------------------------------------- Encounter Discharge Information Details Patient Name: Date of Service: Kyle [name], RO BERT 03/21/2022 10:00 A M Medical Record Number: [id number] Patient Account Number: [id number] Date of Birth/Sex: Treating RN: 1995-10-20 (26 y.o. [name] Primary Care Halden Phegley: Oralia Manis Other Clinician: Referring Cledith Kamiya: Treating Zymeir Salminen/Extender: Levi Aland in Treatment: 7 Encounter Discharge Information Items Post Procedure Vitals Discharge Condition: Stable Temperature (F): 98.2 Ambulatory Status: Ambulatory Pulse (bpm): 99 Discharge Destination: Home Respiratory Rate (breaths/min): 16 Transportation: Private Auto Blood Pressure (mmHg): 127/78 Accompanied By: self Schedule Follow-up Appointment: Yes Clinical Summary of Care: Patient Declined Electronic Signature(s) Signed: 03/21/2022 2:31:58 PM By: Samuella Bruin Entered By: Samuella Bruin on 03/21/2022 11:21:04 [name] ([id number]) [id number] Page 2 of 8 -------------------------------------------------------------------------------- Lower Extremity Assessment Details Patient Name: Date of Service: [name], [place] BERT 03/21/2022 10:00 A M Medical Record Number: [id number] Patient Account Number: [id number] Date of Birth/Sex: Treating RN: 1996-04-15 (26 y.o. [name] Primary Care Sair Faulcon: Oralia Manis Other Clinician: Referring Dilara Navarrete: Treating Kyrus Hyde/Extender: Levi Aland in Treatment: 7 Edema Assessment Assessed: Kyra Searles: No] Franne Forts: No] [Left: Edema] [Right: :] Calf Left: Right: Point of Measurement: From Medial Instep 49.2 cm Ankle Left: Right: Point of Measurement: From Medial Instep 28.5 cm Vascular Assessment Pulses: Dorsalis Pedis Palpable: [Left:Yes] Electronic Signature(s) Signed: 03/21/2022 2:31:58 PM By: Samuella Bruin Entered By: Samuella Bruin on 03/21/2022 10:23:54 -------------------------------------------------------------------------------- Multi Wound Chart Details Patient Name: Date of Service: Kyle [name], RO BERT 03/21/2022 10:00 A M Medical Record Number: [id number] Patient Account Number: [id number] Date of Birth/Sex: Treating RN: 01-19-1996 (26 y.o. M) Primary Care Keilah Lemire: Oralia Manis Other Clinician: Referring  Veto Macqueen: Treating Jimmey Hengel/Extender: Levi Aland in Treatment: 7 Vital Signs Height(in): 71 Pulse(bpm): 99 Weight(lbs): 300 Blood Pressure(mmHg): 127/78 Body Mass Index(BMI): 41.8 Temperature(F): 98.2 Respiratory Rate(breaths/min): 16 [1:Photos:] [N/A:N/A [id number] Page 3 of 8] Left, Anterior Lower Leg Left, Lateral Lower Leg N/A Wound Location: Surgical Injury Surgical Injury N/A Wounding Event: Lesion Lesion N/A Primary Etiology: 01/04/2022 01/04/2022 N/A Date Acquired: 7 7 N/A Weeks of Treatment: Open Open N/A Wound Status: No No N/A Wound Recurrence: 3.5x1.5x0.1 0x0x0 N/A Measurements L x W x D (cm) 4.123 0 N/A A (cm) : rea 0.412 0 N/A Volume (cm) : 84.30% 100.00% N/A % Reduction in A rea: 99.00% 100.00% N/A %  Reduction in Volume: Full Thickness Without Exposed Full Thickness Without Exposed N/A Classification: Support Structures Support Structures Medium None Present N/A Exudate A mount: Serosanguineous N/A N/A Exudate Type: red, brown N/A N/A Exudate Color: Distinct, outline attached Flat and Intact N/A Wound Margin: Large (67-100%) None Present (0%) N/A Granulation A mount: Red, Hyper-granulation N/A N/A Granulation Quality: Small (1-33%) None Present (0%) N/A Necrotic A mount: Eschar, Adherent Slough N/A N/A Necrotic Tissue: Fat Layer (Subcutaneous Tissue): Yes Fascia: No N/A Exposed Structures: Fascia: No Fat Layer (Subcutaneous Tissue): No Tendon: No Tendon: No Muscle: No Muscle: No Joint: No Joint: No Bone: No Bone: No Medium (34-66%) Large (67-100%) N/A Epithelialization: Debridement - Selective/Open Wound N/A N/A Debridement: Pre-procedure Verification/Time Out 10:30 N/A N/A Taken: Lidocaine 5% topical ointment N/A N/A Pain Control: Necrotic/Eschar, Slough N/A N/A Tissue Debrided: Non-Viable Tissue N/A N/A Level: 5.25 N/A N/A Debridement A (sq cm): rea Curette N/A  N/A Instrument: Minimum N/A N/A Bleeding: Pressure N/A N/A Hemostasis A chieved: Procedure was tolerated well N/A N/A Debridement Treatment Response: 3.5x1.5x0.1 N/A N/A Post Debridement Measurements L x W x D (cm) 0.412 N/A N/A Post Debridement Volume: (cm) No Abnormalities Noted Scarring: Yes N/A Periwound Skin Texture: No Abnormalities Noted Dry/Scaly: Yes N/A Periwound Skin Moisture: Rubor: Yes No Abnormalities Noted N/A Periwound Skin Color: No Abnormality No Abnormality N/A Temperature: Chemical Cauterization N/A N/A Procedures Performed: Debridement Treatment Notes Electronic Signature(s) Signed: 03/21/2022 10:37:40 AM By: [name] MD FACS Entered By: [name] on 03/21/2022 10:37:39 -------------------------------------------------------------------------------- Multi-Disciplinary Care Plan Details Patient Name: Date of Service: [name], [place] BERT 03/21/2022 10:00 A M Medical Record Number: [id number] Patient Account Number: [id number] Date of Birth/Sex: Treating RN: [birth year] (26 y.o. [name] Primary Care Lorilynn Lehr: Tera Partridge Other Clinician: Referring Morgon Pamer: Treating Kiersten Coss/Extender: Edwena Blow in Treatment: 7 Active Inactive Wound/Skin Impairment KARO, ROG ([id number]) [id number] Page 4 of 8 Nursing Diagnoses: Knowledge deficit related to ulceration/compromised skin integrity Goals: Patient/caregiver will verbalize understanding of skin care regimen Date Initiated: 01/28/2022 Target Resolution Date: 06/05/2022 Goal Status: Active Interventions: Assess ulceration(s) every visit Treatment Activities: Skin care regimen initiated : 01/28/2022 Notes: Electronic Signature(s) Signed: 03/21/2022 2:31:58 PM By: Adline Peals Entered By: Adline Peals on 03/21/2022  10:29:46 -------------------------------------------------------------------------------- Pain Assessment Details Patient Name: Date of Service: Kyle Vela Prose, [place] BERT 03/21/2022 10:00 A M Medical Record Number: [id number] Patient Account Number: [id number] Date of Birth/Sex: Treating RN: 09-27-1995 (26 y.o. [name] Primary Care Dannica Bickham: Tera Partridge Other Clinician: Referring Shilo Pauwels: Treating Mccoy Testa/Extender: Edwena Blow in Treatment: 7 Active Problems Location of Pain Severity and Description of Pain Patient Has Paino No Site Locations Rate the pain. Current Pain Level: 0 Pain Management and Medication Current Pain Management: Electronic Signature(s) Signed: 03/21/2022 2:31:58 PM By: Adline Peals Entered By: Adline Peals on 03/21/2022 10:18:14 [name] ([id number]) [id number] Page 5 of 8 -------------------------------------------------------------------------------- Patient/Caregiver Education Details Patient Name: Date of Service: [name], [place] BERT 10/16/2023andnbsp10:00 A M Medical Record Number: [id number] Patient Account Number: [id number] Date of Birth/Gender: Treating RN: August 21, 1995 (26 y.o. [name] Primary Care Physician: Tera Partridge Other Clinician: Referring Physician: Treating Physician/Extender: Edwena Blow in Treatment: 7 Education Assessment Education Provided To: Patient Education Topics Provided Wound/Skin Impairment: Methods: Explain/Verbal Responses: Reinforcements needed, State content correctly Electronic Signature(s) Signed: 03/21/2022 2:31:58 PM By: Adline Peals Entered By: Adline Peals on 03/21/2022 10:32:53 -------------------------------------------------------------------------------- Wound Assessment Details Patient Name: Date of Service: [name], [place] BERT 03/21/2022 10:00 A M  Medical  Record Number: [id number] Patient Account Number: [id number] Date of Birth/Sex: Treating RN: [birth year] (26 y.o. [name] Primary Care Berlin Mokry: Oralia Manis Other Clinician: Referring Rorey Hodges: Treating Yostin Malacara/Extender: Levi Aland in Treatment: 7 Wound Status Wound Number: 1 Primary Etiology: Lesion Wound Location: Left, Anterior Lower Leg Wound Status: Open Wounding Event: Surgical Injury Date Acquired: 01/04/2022 Weeks Of Treatment: 7 Clustered Wound: No Photos Wound Measurements Length: (cm) 3.5 Width: (cm) 1.5 Caradonna, Jeremiah ([id number]) Depth: (cm) 0.1 Area: (cm) 4.123 Volume: (cm) 0.412 % Reduction in Area: 84.3% % Reduction in Volume: 99% [id number] Page 6 of 8 Epithelialization: Medium (34-66%) Tunneling: No Undermining: No Wound Description Classification: Full Thickness Without Exposed Support Structures Wound Margin: Distinct, outline attached Exudate Amount: Medium Exudate Type: Serosanguineous Exudate Color: red, brown Foul Odor After Cleansing: No Slough/Fibrino Yes Wound Bed Granulation Amount: Large (67-100%) Exposed Structure Granulation Quality: Red, Hyper-granulation Fascia Exposed: No Necrotic Amount: Small (1-33%) Fat Layer (Subcutaneous Tissue) Exposed: Yes Necrotic Quality: Eschar, Adherent Slough Tendon Exposed: No Muscle Exposed: No Joint Exposed: No Bone Exposed: No Periwound Skin Texture Texture Color No Abnormalities Noted: Yes No Abnormalities Noted: No Rubor: Yes Moisture No Abnormalities Noted: Yes Temperature / Pain Temperature: No Abnormality Treatment Notes Wound #1 (Lower Leg) Wound Laterality: Left, Anterior Cleanser Soap and Water Discharge Instruction: May shower and wash wound with dial antibacterial soap and water prior to dressing change. Wound Cleanser Discharge Instruction: Cleanse the wound with wound cleanser prior to applying a clean  dressing using gauze sponges, not tissue or cotton balls. Peri-Wound Care Topical Primary Dressing KerraCel Ag Gelling Fiber Dressing, 2x2 in (silver alginate) Discharge Instruction: Apply silver alginate to wound bed as instructed Secondary Dressing ABD Pad, 8x10 Discharge Instruction: Apply over primary dressing as directed. Woven Gauze Sponge, Non-Sterile 4x4 in Discharge Instruction: Apply over primary dressing as directed. Secured With Paper Tape, 2x10 (in/yd) Discharge Instruction: Secure dressing with tape as directed. Compression Wrap ThreePress (3 layer compression wrap) Discharge Instruction: Apply three layer compression as directed. Unnaboot w/Calamine, 4x10 (in/yd) Discharge Instruction: Apply Unnaboot only below the knee, Netting Tubular Discharge Instruction: on left leg Compression Stockings Add-Ons Electronic Signature(s) Signed: 03/21/2022 2:31:58 PM By: Samuella Bruin Entered By: Samuella Bruin on 03/21/2022 10:27:02 [name] ([id number]) [id number] Page 7 of 8 -------------------------------------------------------------------------------- Wound Assessment Details Patient Name: Date of Service: [name], RO BERT 03/21/2022 10:00 A M Medical Record Number: [id number] Patient Account Number: [id number] Date of Birth/Sex: Treating RN: 1995/07/10 (26 y.o. [name] Primary Care Tilman Mcclaren: Oralia Manis Other Clinician: Referring Lindaann Gradilla: Treating Khilynn Borntreger/Extender: Levi Aland in Treatment: 7 Wound Status Wound Number: 3 Primary Etiology: Lesion Wound Location: Left, Lateral Lower Leg Wound Status: Open Wounding Event: Surgical Injury Date Acquired: 01/04/2022 Weeks Of Treatment: 7 Clustered Wound: No Photos Wound Measurements Length: (cm) Width: (cm) Depth: (cm) Area: (cm) Volume: (cm) 0 % Reduction in Area: 100% 0 % Reduction in Volume: 100% 0  Epithelialization: Large (67-100%) 0 Tunneling: No 0 Undermining: No Wound Description Classification: Full Thickness Without Exposed Suppor Wound Margin: Flat and Intact Exudate Amount: None Present t Structures Foul Odor After Cleansing: No Slough/Fibrino No Wound Bed Granulation Amount: None Present (0%) Exposed Structure Necrotic Amount: None Present (0%) Fascia Exposed: No Fat Layer (Subcutaneous Tissue) Exposed: No Tendon Exposed: No Muscle Exposed: No Joint Exposed: No Bone Exposed: No Periwound Skin Texture Texture Color No Abnormalities Noted: No No Abnormalities Noted: Yes Scarring: Yes Temperature / Pain Temperature: No Abnormality Moisture No  Abnormalities Noted: No Dry / Scaly: Yes Electronic Signature(s) Signed: 03/21/2022 2:31:58 PM By: Samuella Bruin Entered By: Samuella Bruin on 03/21/2022 10:27:31 [name] ([id number]) [id number] Page 8 of 8 -------------------------------------------------------------------------------- Vitals Details Patient Name: Date of Service: Kyle [name], [place] BERT 03/21/2022 10:00 A M Medical Record Number: [id number] Patient Account Number: [id number] Date of Birth/Sex: Treating RN: 1995/10/20 (26 y.o. [name] Primary Care Shalla Bulluck: Oralia Manis Other Clinician: Referring Alvena Kiernan: Treating Jashan Cotten/Extender: Levi Aland in Treatment: 7 Vital Signs Time Taken: 10:18 Temperature (F): 98.2 Height (in): 71 Pulse (bpm): 99 Weight (lbs): 300 Respiratory Rate (breaths/min): 16 Body Mass Index (BMI): 41.8 Blood Pressure (mmHg): 127/78 Reference Range: 80 - 120 mg / dl Electronic Signature(s) Signed: 03/21/2022 2:31:58 PM By: Samuella Bruin Entered By: Samuella Bruin on 03/21/2022 10:19:26

## 2022-03-21 NOTE — Progress Notes (Signed)
WAKEEM, BIALECKI ([id number]) [id number] Page 1 of 8 Visit Report for 03/21/2022 Chief Complaint Document Details Patient Name: Date of Service: [name], [name] Houston 03/21/2022 10:00 A M Medical Record Number: [id number] Patient Account Number: [id number] Date of Birth/Sex: Treating RN: 05/31/1996 (26 y.o. M) Primary Care Provider: Tera Partridge Other Clinician: Referring Provider: Treating Provider/Extender: Edwena Blow in Treatment: 7 Information Obtained from: Patient Chief Complaint Patient presents to the wound care center with open non-healing surgical wound(s) Electronic Signature(s) Signed: 03/21/2022 7:37:45 AM By: [name] MD FACS Entered By: [name] on 03/21/2022 10:37:45 -------------------------------------------------------------------------------- Debridement Details Patient Name: Date of Service: [name], [name] 03/21/2022 10:00 A M Medical Record Number: [id number] Patient Account Number: [id number] Date of Birth/Sex: Treating RN: 1996/04/21 (26 y.o. [name] Primary Care Provider: Tera Partridge Other Clinician: Referring Provider: Treating Provider/Extender: Edwena Blow in Treatment: 7 Debridement Performed for Assessment: Wound #1 Left,Anterior Lower Leg Performed By: Physician [name], MD Debridement Type: Debridement Level of Consciousness (Pre-procedure): Awake and Alert Pre-procedure Verification/Time Out Yes - 10:30 Taken: Start Time: 10:30 Pain Control: Lidocaine 5% topical ointment T Area Debrided (L x W): otal 3.5 (cm) x 1.5 (cm) = 5.25 (cm) Tissue and other material debrided: Non-Viable, Eschar, Dows Level: Non-Viable Tissue Debridement Description: Selective/Open Wound Instrument: Curette Bleeding: Minimum Hemostasis Achieved: Pressure Response to Treatment: Procedure was tolerated well Level of  Consciousness (Post- Awake and Alert procedure): Post Debridement Measurements of Total Wound Length: (cm) 3.5 Width: (cm) 1.5 Depth: (cm) 0.1 Volume: (cm) 0.412 Character of Wound/Ulcer Post Debridement: Improved Post Procedure Diagnosis Same as Pre-procedure [name] ([id number]) [id number] Page 2 of 8 Notes scribed for Dr. [name] by Adline Peals, RN Electronic Signature(s) Signed: 03/21/2022 8:21:54 AM By: [name] MD FACS Signed: 03/21/2022 2:31:58 PM By: Adline Peals Entered By: Adline Peals on 03/21/2022 10:31:03 -------------------------------------------------------------------------------- HPI Details Patient Name: Date of Service: [name], [name] 03/21/2022 10:00 A M Medical Record Number: [id number] Patient Account Number: [id number] Date of Birth/Sex: Treating RN: July 12, 1995 (26 y.o. 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 He was in [place] and apparently was hospitalized for dehydration and possible sepsis. We do not have any records from his hospitalization, but at some point he underwent surgery and has 3 open wounds. He reports that they did this to "take cultures." Again, no records are available to know if this was done, what those results were, or what antibiotics he was given. He is now here today for further evaluation and management of 3 left lower extremity wounds. He has a wound just caudal to his knee on the lateral aspect of his leg, another on his lateral calf, and a third on his anterior tibial surface. All of them have slough. The anterior tibial surface wound is the largest and also has some necrotic fat present. There is  periwound erythema and tenderness. The placement of the incisions does not correlate with 4 compartment fasciotomy so I am not entirely sure what the thought process of the surgeons in [place] was or what specifically they were operating for. Hopefully we will receive those documents before his next visit. 02/08/2022: We have not received any information from [place]. His wounds are little bit smaller today, but still have a fair  amount of slough as well as some nonviable subcutaneous tissue present. The culture that I took last week returned positive only for very low levels of methicillin sensitive Staph aureus, nonpathologic levels. 02/18/2022: The left lateral knee wound is healed. There is just a bit of eschar on the surface that once removed, revealed complete epithelialization. The more proximal and lateral lower leg wound is quite a bit smaller with just a little bit of slough and some good robust looking granulation tissue. The largest of the wounds on the anterior tibia has a fair amount of slough buildup but has contracted quite a bit since our last visit. 02/28/2022: The more proximal lateral leg wound is flush with the surrounding skin surface and has epithelialized significantly. There is just a small open portion with a light layer of slough present. The large anterior tibial wound is much cleaner today, but still has some slough and nonviable subcutaneous tissue present. 03/14/2022: The lateral leg wound is nearly closed; there is just a small superficial opening under a layer of eschar. The large anterior tibial wound has contracted considerably. There is just a bit of slough buildup on the surface, but nowhere near to the degree as seen on previous occasions. 03/21/2022: The lateral leg wound is healed. The anterior tibial wound has contracted further. There is some slough accumulation over hypertrophic granulation tissue. Electronic Signature(s) Signed: 03/21/2022 7:39:05 AM By:  [name] MD FACS Entered By: [name] on 03/21/2022 10:39:04 -------------------------------------------------------------------------------- Chemical Cauterization Details Patient Name: Date of Service: [name], [name] 03/21/2022 10:00 A M Medical Record Number: [id number] Patient Account Number: [id number] Date of Birth/Sex: Treating RN: [birth year] (26 y.o. [name] Primary Care Provider: Tera Partridge Other Clinician: Referring Provider: Treating Provider/Extender: Edwena Blow in Treatment: 7 Procedure Performed for: Wound #1 Left,Anterior Lower Leg [name], [name] ([id number]) [id number] Page 3 of 8 Performed By: Physician [name], MD Post Procedure Diagnosis Same as Pre-procedure Notes scribed for Dr. [name] by Adline Peals, RN Electronic Signature(s) Signed: 03/21/2022 8:21:54 AM By: [name] MD FACS Signed: 03/21/2022 2:31:58 PM By: Adline Peals Entered By: Adline Peals on 03/21/2022 10:31:19 -------------------------------------------------------------------------------- Physical Exam Details Patient Name: Date of Service: [name], [name] 03/21/2022 10:00 A M Medical Record Number: [id number] Patient Account Number: [id number] Date of Birth/Sex: Treating RN: 1995/12/15 (26 y.o. M) Primary Care Provider: Tera Partridge Other Clinician: Referring Provider: Treating Provider/Extender: Edwena Blow in Treatment: 7 Constitutional . . . . No acute distress.Marland Kitchen Respiratory Normal work of breathing on room air.. Notes 03/21/2022: The lateral leg wound is healed. The anterior tibial wound has contracted further. There is some slough accumulation over hypertrophic granulation tissue. Electronic Signature(s) Signed: 03/21/2022 7:39:34 AM By: [name] MD FACS Entered By: [name] on 03/21/2022  10:39:33 -------------------------------------------------------------------------------- Physician Orders Details Patient Name: Date of Service: [name], [name] 03/21/2022 10:00 A M Medical Record Number: [id number] Patient Account Number: [id number] Date of Birth/Sex: Treating RN: [birth year] (26 y.o. [name] Primary Care Provider: Tera Partridge Other Clinician: Referring Provider: Treating Provider/Extender: Edwena Blow in Treatment: 7 Verbal / Phone Orders: No Diagnosis Coding ICD-10 Coding Code Description [phone] 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sed E66.01 Morbid (severe) obesity due to excess calories Follow-up Appointments ppointment in 1 week. - Dr [name] - Room 2 Return A JESHAWN, FAHIM ([id number]) [id number] Page 4 of 8 Anesthetic (In clinic) Topical Lidocaine 5% applied to  wound bed Bathing/ Shower/ Hygiene May shower with protection but do not get wound dressing(s) wet. - Please do not get wraps on left leg wet-use a cast protector to cover leg when bathing Edema Control - Lymphedema / SCD / Other Elevate legs to the level of the heart or above for 30 minutes daily and/or when sitting, a frequency of: Avoid standing for long periods of time. Wound Treatment Wound #1 - Lower Leg Wound Laterality: Left, Anterior Cleanser: Soap and Water 1 x Per Week/30 Days Discharge Instructions: May shower and wash wound with dial antibacterial soap and water prior to dressing change. Cleanser: Wound Cleanser (Generic) 1 x Per Week/30 Days Discharge Instructions: Cleanse the wound with wound cleanser prior to applying a clean dressing using gauze sponges, not tissue or cotton balls. Prim Dressing: KerraCel Ag Gelling Fiber Dressing, 2x2 in (silver alginate) 1 x Per Week/30 Days ary Discharge Instructions: Apply silver alginate to wound bed as  instructed Secondary Dressing: ABD Pad, 8x10 1 x Per Week/30 Days Discharge Instructions: Apply over primary dressing as directed. Secondary Dressing: Woven Gauze Sponge, Non-Sterile 4x4 in 1 x Per Week/30 Days Discharge Instructions: Apply over primary dressing as directed. Secured With: Paper Tape, 2x10 (in/yd) 1 x Per Week/30 Days Discharge Instructions: Secure dressing with tape as directed. Compression Wrap: ThreePress (3 layer compression wrap) 1 x Per Week/30 Days Discharge Instructions: Apply three layer compression as directed. Compression Wrap: Unnaboot w/Calamine, 4x10 (in/yd) 1 x Per Week/30 Days Discharge Instructions: Apply Unnaboot only below the knee, Compression Wrap: Netting Tubular 1 x Per Week/30 Days Discharge Instructions: on left leg Patient Medications llergies: No Known Allergies A Notifications Medication Indication Start End 03/21/2022 lidocaine DOSE topical 5 % ointment - ointment topical Electronic Signature(s) Signed: 03/21/2022 8:21:54 AM By: [name] MD FACS Signed: 03/21/2022 2:31:58 PM By: Adline Peals Entered By: Adline Peals on 03/21/2022 10:41:40 -------------------------------------------------------------------------------- Problem List Details Patient Name: Date of Service: [name], [name] 03/21/2022 10:00 A M Medical Record Number: [id number] Patient Account Number: [id number] Date of Birth/Sex: Treating RN: 1995/10/30 (26 y.o. M) Primary Care Provider: Tera Partridge Other Clinician: Referring Provider: Treating Provider/Extender: Edwena Blow in Treatment: 7 Active Problems ICD-10 KAVONTAE, WOLFGRAMM ([id number]) [id number] Page 5 of 8 Encounter Code Description Active Date MDM Diagnosis [phone] 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sed8/25/2023 No Yes E66.01 Morbid (severe) obesity due to excess calories 01/28/2022 No  Yes Inactive Problems Resolved Problems Electronic Signature(s) Signed: 03/21/2022 10:34:46 AM By: [name] MD FACS Entered By: [name] on 03/21/2022 10:34:46 -------------------------------------------------------------------------------- Progress Note Details Patient Name: Date of Service: [name], [name] 03/21/2022 10:00 A M Medical Record Number: [id number] Patient Account Number: [id number] Date of Birth/Sex: Treating RN: [birth year] (26 y.o. 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 He was in [place] and apparently was hospitalized for dehydration and possible sepsis. We do not have any records from his hospitalization, but at some point he underwent surgery and has 3 open wounds. He reports that they did this to "take cultures." Again, no records are available to know if this was done, what those results were, or what antibiotics he  was given. He is now here today for further evaluation and management of 3 left lower extremity wounds. He has a wound just caudal to his knee on the lateral aspect of his leg, another on his lateral calf, and a third on his anterior tibial surface. All of them have slough. The anterior tibial surface wound is the largest and also has some necrotic fat present. There is periwound erythema and tenderness. The placement of the incisions does not correlate with 4 compartment fasciotomy so I am not entirely sure what the thought process of the surgeons in [place] was or what specifically they were operating for. Hopefully we will receive those  documents before his next visit. 02/08/2022: We have not received any information from [place]. His wounds are little bit smaller today, but still have a fair amount of slough as well as some nonviable subcutaneous tissue present. The culture that I took last week returned positive only for very low levels of methicillin sensitive Staph aureus, nonpathologic levels. 02/18/2022: The left lateral knee wound is healed. There is just a bit of eschar on the surface that once removed, revealed complete epithelialization. The more proximal and lateral lower leg wound is quite a bit smaller with just a little bit of slough and some good robust looking granulation tissue. The largest of the wounds on the anterior tibia has a fair amount of slough buildup but has contracted quite a bit since our last visit. 02/28/2022: The more proximal lateral leg wound is flush with the surrounding skin surface and has epithelialized significantly. There is just a small open portion with a light layer of slough present. The large anterior tibial wound is much cleaner today, but still has some slough and nonviable subcutaneous tissue present. 03/14/2022: The lateral leg wound is nearly closed; there is just a small superficial opening under a layer of eschar. The large anterior tibial wound has contracted considerably. There is just a bit of slough buildup on the surface, but nowhere near to the degree as seen on previous occasions. 03/21/2022: The lateral leg wound is healed. The anterior tibial wound has contracted further. There is some slough accumulation over hypertrophic granulation tissue. Patient History Information obtained from Patient. Social History Former smoker, Alcohol Use - Rarely, Drug Use - No History, Caffeine Use - Moderate. [name], [name] ([id number]) [id number] Page 6 of 8 Objective Constitutional No acute distress.. Vitals Time Taken: 10:18 AM, Height: 71 in, Weight:  300 lbs, BMI: 41.8, Temperature: 98.2 F, Pulse: 99 bpm, Respiratory Rate: 16 breaths/min, Blood Pressure: 127/78 mmHg. Respiratory Normal work of breathing on room air.. General Notes: 03/21/2022: The lateral leg wound is healed. The anterior tibial wound has contracted further. There is some slough accumulation over hypertrophic granulation tissue. Integumentary (Hair, Skin) Wound #1 status is Open. Original cause of wound was Surgical Injury. The date acquired was: 01/04/2022. The wound has been in treatment 7 weeks. The wound is located on the Left,Anterior Lower Leg. The wound measures 3.5cm length x 1.5cm width x 0.1cm depth; 4.123cm^2 area and 0.412cm^3 volume. There is Fat Layer (Subcutaneous Tissue) exposed. There is no tunneling or undermining noted. There is a medium amount of serosanguineous drainage noted. The wound margin is distinct with the outline attached to the wound base. There is large (67-100%) red, hyper - granulation within the wound bed. There is a small (1- 33%) amount of necrotic tissue within the wound bed including Eschar and Adherent Slough. The periwound skin appearance had no abnormalities noted  for texture. The periwound skin appearance had no abnormalities noted for moisture. The periwound skin appearance exhibited: Rubor. Periwound temperature was noted as No Abnormality. Wound #3 status is Open. Original cause of wound was Surgical Injury. The date acquired was: 01/04/2022. The wound has been in treatment 7 weeks. The wound is located on the Left,Lateral Lower Leg. The wound measures 0cm length x 0cm width x 0cm depth; 0cm^2 area and 0cm^3 volume. There is no tunneling or undermining noted. There is a none present amount of drainage noted. The wound margin is flat and intact. There is no granulation within the wound bed. There is no necrotic tissue within the wound bed. The periwound skin appearance had no abnormalities noted for color. The periwound skin appearance  exhibited: Scarring, Dry/Scaly. Periwound temperature was noted as No Abnormality. Assessment Active Problems ICD-10 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sed Morbid (severe) obesity due to excess calories Procedures Wound #1 Pre-procedure diagnosis of Wound #1 is a Lesion located on the Left,Anterior Lower Leg . There was a Selective/Open Wound Non-Viable Tissue Debridement with a total area of 5.25 sq cm performed by [name], MD. With the following instrument(s): Curette to remove Non-Viable tissue/material. Material removed includes Eschar and Slough and after achieving pain control using Lidocaine 5% topical ointment. No specimens were taken. A time out was conducted at 10:30, prior to the start of the procedure. A Minimum amount of bleeding was controlled with Pressure. The procedure was tolerated well. Post Debridement Measurements: 3.5cm length x 1.5cm width x 0.1cm depth; 0.412cm^3 volume. Character of Wound/Ulcer Post Debridement is improved. Post procedure Diagnosis Wound #1: Same as Pre-Procedure General Notes: scribed for Dr. [name] by Adline Peals, RN. Pre-procedure diagnosis of Wound #1 is a Lesion located on the Left,Anterior Lower Leg . An Chemical Cauterization procedure was performed by [name], MD. Post procedure Diagnosis Wound #1: Same as Pre-Procedure Notes: scribed for Dr. [name] by Adline Peals, RN Plan Follow-up Appointments: Return Appointment in 1 week. - Dr [name] - Room 2 Anesthetic: (In clinic) Topical Lidocaine 5% applied to wound bed Bathing/ Shower/ Hygiene: May shower with protection but do not get wound dressing(s) wet. - Please do not get wraps on left leg wet-use a cast protector to cover leg when bathing Edema Control - Lymphedema / SCD / Other: Elevate legs to the level of the heart or above for 30 minutes daily and/or when sitting, a frequency of: Avoid standing for long periods  of time. [name], [name] ([id number]) [id number] Page 7 of 8 The following medication(s) was prescribed: lidocaine topical 5 % ointment ointment topical was prescribed at facility WOUND #1: - Lower Leg Wound Laterality: Left, Anterior Cleanser: Soap and Water 1 x Per Week/30 Days Discharge Instructions: May shower and wash wound with dial antibacterial soap and water prior to dressing change. Cleanser: Wound Cleanser (Generic) 1 x Per Week/30 Days Discharge Instructions: Cleanse the wound with wound cleanser prior to applying a clean dressing using gauze sponges, not tissue or cotton balls. Prim Dressing: KerraCel Ag Gelling Fiber Dressing, 2x2 in (silver alginate) 1 x Per Week/30 Days ary Discharge Instructions: Apply silver alginate to wound bed as instructed Secondary Dressing: ABD Pad, 8x10 1 x Per Week/30 Days Discharge Instructions: Apply over primary dressing as directed. Secondary Dressing: Woven Gauze Sponge, Non-Sterile 4x4 in 1 x Per Week/30 Days Discharge Instructions: Apply over primary dressing as directed. Secured With: Paper T ape, 2x10 (in/yd) 1 x Per Week/30  Days Discharge Instructions: Secure dressing with tape as directed. Com pression Wrap: ThreePress (3 layer compression wrap) 1 x Per Week/30 Days Discharge Instructions: Apply three layer compression as directed. Com pression Wrap: Unnaboot w/Calamine, 4x10 (in/yd) 1 x Per Week/30 Days Discharge Instructions: Apply Unnaboot only below the knee, Com pression Wrap: Netting Tubular 1 x Per Week/30 Days Discharge Instructions: on left leg 03/21/2022: The lateral leg wound is healed. The anterior tibial wound has contracted further. There is some slough accumulation over hypertrophic granulation tissue. I used a curette to debride the slough from the wound surface. I then chemically cauterized the hypertrophic granulation tissue using silver nitrate. We will continue silver alginate with 3  layer compression. I anticipate that he will be closed in the next 2 to 3 weeks. Electronic Signature(s) Signed: 03/21/2022 7:40:15 AM By: [name] MD FACS Entered By: [name] on 03/21/2022 10:40:14 -------------------------------------------------------------------------------- HxROS Details Patient Name: Date of Service: [name], [name] 03/21/2022 10:00 A M Medical Record Number: [id number] Patient Account Number: [id number] Date of Birth/Sex: Treating RN: 1995-10-03 (26 y.o. M) Primary Care Provider: Tera Partridge Other Clinician: Referring Provider: Treating Provider/Extender: Edwena Blow in Treatment: 7 Information Obtained From Patient Immunizations Pneumococcal Vaccine: Received Pneumococcal Vaccination: No Implantable Devices None Family and Social History Former smoker; Alcohol Use: Rarely; Drug Use: No History; Caffeine Use: Moderate; Financial Concerns: No; Food, Clothing or Shelter Needs: No; Support System Lacking: No; Transportation Concerns: No Electronic Signature(s) Signed: 03/21/2022 8:21:54 AM By: [name] MD FACS Entered By: [name] on 03/21/2022 10:39:10 [name] ([id number]) [id number] Page 8 of 8 -------------------------------------------------------------------------------- SuperBill Details Patient Name: Date of Service: [name] 03/21/2022 Medical Record Number: [id number] Patient Account Number: [id number] Date of Birth/Sex: Treating RN: July 23, 1995 (26 y.o. M) Primary Care Provider: Tera Partridge Other Clinician: Referring Provider: Treating Provider/Extender: Edwena Blow in Treatment: 7 Diagnosis Coding ICD-10 Codes Code Description [phone] 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sed E66.01 Morbid (severe) obesity due to excess calories Facility Procedures : CPT4  Code: 41324401 Description: 02725 - DEBRIDE WOUND 1ST 20 SQ CM OR < ICD-10 Diagnosis Description 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 Modifier: d Quantity: 1 Physician Procedures : CPT4 Code Description Modifier [id number] - WC PHYS LEVEL 3 - EST PT 25 ICD-10 Diagnosis Description 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d E66.01 Morbid (severe) obesity due to excess calories Quantity: 1 : [id number] - WC PHYS DEBR WO ANESTH 20 SQ CM ICD-10 Diagnosis Description 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d Quantity: 1 Electronic Signature(s) Signed: 03/21/2022 7:40:28 AM By: [name] MD FACS Entered By: [name] on 03/21/2022 10:40:27

## 2022-03-28 ENCOUNTER — Encounter (HOSPITAL_BASED_OUTPATIENT_CLINIC_OR_DEPARTMENT_OTHER): Payer: 59 | Admitting: General Surgery

## 2022-03-28 DIAGNOSIS — L97822 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sed: Secondary | ICD-10-CM | POA: Diagnosis not present

## 2022-03-28 NOTE — Progress Notes (Signed)
[name], [name] ([id number]) [id number] Page 1 of 7 Visit Report for 03/28/2022 Chief Complaint Document Details Patient Name: Date of Service: [name], [place] 03/28/2022 10:45 A M Medical Record Number: [id number] Patient Account Number: [id number] Date of Birth/Sex: Treating RN: 03-Dec-1995 (26 y.o. M) Primary Care Provider: Oralia Manis Other Clinician: Referring Provider: Treating Provider/Extender: Levi Aland in Treatment: 8 Information Obtained from: Patient Chief Complaint Patient presents to the wound care center with open non-healing surgical wound(s) Electronic Signature(s) Signed: 03/28/2022 11:12:13 AM By: Duanne Guess MD FACS Entered By: Duanne Guess on 03/28/2022 11:12:12 -------------------------------------------------------------------------------- Debridement Details Patient Name: Date of Service: [name], [name] 03/28/2022 10:45 A M Medical Record Number: [id number] Patient Account Number: [id number] Date of Birth/Sex: Treating RN: February 23, 1996 (26 y.o. [name] Primary Care Provider: Oralia Manis Other Clinician: Referring Provider: Treating Provider/Extender: Levi Aland in Treatment: 8 Debridement Performed for Assessment: Wound #1 Left,Anterior Lower Leg Performed By: Physician Duanne Guess, MD Debridement Type: Debridement Level of Consciousness (Pre-procedure): Awake and Alert Pre-procedure Verification/Time Out Yes - 11:02 Taken: Start Time: 11:02 Pain Control: Lidocaine 4% T opical Solution T Area Debrided (L x W): otal 3.2 (cm) x 1.1 (cm) = 3.52 (cm) Tissue and other material debrided: Non-Viable, Slough, Subcutaneous, Slough Level: Skin/Subcutaneous Tissue Debridement Description: Excisional Instrument: Curette Bleeding: Minimum Hemostasis Achieved: Pressure Response to Treatment: Procedure was tolerated well Level of  Consciousness (Post- Awake and Alert procedure): Post Debridement Measurements of Total Wound Length: (cm) 3.2 Width: (cm) 1.1 Depth: (cm) 0.1 Volume: (cm) 0.276 Character of Wound/Ulcer Post Debridement: Improved Post Procedure Diagnosis Same as Pre-procedure [name] ([id number]) [id number] Page 2 of 7 Notes scribed for Dr. Lady Gary by Samuella Bruin, RN Electronic Signature(s) Signed: 03/28/2022 11:14:45 AM By: Duanne Guess MD FACS Signed: 03/28/2022 4:32:28 PM By: Gelene Mink By: Samuella Bruin on 03/28/2022 11:03:40 -------------------------------------------------------------------------------- HPI Details Patient Name: Date of Service: [name], [name] 03/28/2022 10:45 A M Medical Record Number: [id number] Patient Account Number: [id number] Date of Birth/Sex: Treating RN: April 07, 1996 (26 y.o. 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 He was in [place] and apparently was hospitalized for dehydration and possible sepsis. We do not have any records from his hospitalization, but at some point he underwent surgery and has 3 open wounds. He reports that they did this to "take cultures." Again, no records are available to know if this was done, what those results were, or what antibiotics he was given. He is now here today for further evaluation and management of 3 left lower extremity wounds. He has a wound just caudal to his knee on the lateral aspect of his leg, another on his lateral calf, and a third on his anterior tibial surface. All of them have slough. The anterior tibial surface wound is the largest and also has some necrotic fat present. There is  periwound erythema and tenderness. The placement of the incisions does not correlate with 4 compartment fasciotomy so I am not entirely sure what the thought process of the surgeons in [place] was or what specifically they were operating for. Hopefully we will receive those documents before his next visit. 02/08/2022: We have not received any information from [place]. His wounds are little bit smaller today, but still have a fair  amount of slough as well as some nonviable subcutaneous tissue present. The culture that I took last week returned positive only for very low levels of methicillin sensitive Staph aureus, nonpathologic levels. 02/18/2022: The left lateral knee wound is healed. There is just a bit of eschar on the surface that once removed, revealed complete epithelialization. The more proximal and lateral lower leg wound is quite a bit smaller with just a little bit of slough and some good robust looking granulation tissue. The largest of the wounds on the anterior tibia has a fair amount of slough buildup but has contracted quite a bit since our last visit. 02/28/2022: The more proximal lateral leg wound is flush with the surrounding skin surface and has epithelialized significantly. There is just a small open portion with a light layer of slough present. The large anterior tibial wound is much cleaner today, but still has some slough and nonviable subcutaneous tissue present. 03/14/2022: The lateral leg wound is nearly closed; there is just a small superficial opening under a layer of eschar. The large anterior tibial wound has contracted considerably. There is just a bit of slough buildup on the surface, but nowhere near to the degree as seen on previous occasions. 03/21/2022: The lateral leg wound is healed. The anterior tibial wound has contracted further. There is some slough accumulation over hypertrophic granulation tissue. 03/28/2022: The anterior tibial wound is a little bit  smaller again this week. He still continues to accumulate slough on top of some hypertrophic granulation tissue. Electronic Signature(s) Signed: 03/28/2022 11:12:53 AM By: [name] MD FACS Entered By: [name] on 03/28/2022 11:12:53 -------------------------------------------------------------------------------- Physical Exam Details Patient Name: Date of Service: [name], [name] 03/28/2022 10:45 A M Medical Record Number: [id number] Patient Account Number: [id number] Date of Birth/Sex: Treating RN: 1996/04/25 (26 y.o. M) Primary Care Provider: Tera Partridge Other Clinician: Referring Provider: Treating Provider/Extender: Edwena Blow in Treatment: 8 [place], Herbie Baltimore ([id number]) [id number] Page 3 of 7 Constitutional . . . . No acute distress.Marland Kitchen Respiratory Normal work of breathing on room air.. Notes 03/28/2022: The anterior tibial wound is a little bit smaller again this week. He still continues to accumulate slough on top of some hypertrophic granulation tissue. Electronic Signature(s) Signed: 03/28/2022 11:13:18 AM By: [name] MD FACS Entered By: [name] on 03/28/2022 11:13:18 -------------------------------------------------------------------------------- Physician Orders Details Patient Name: Date of Service: [name], [name] 03/28/2022 10:45 A M Medical Record Number: [id number] Patient Account Number: [id number] Date of Birth/Sex: Treating RN: February 21, 1996 (26 y.o. [name] Primary Care Provider: Tera Partridge Other Clinician: Referring Provider: Treating Provider/Extender: Edwena Blow in Treatment: 8 Verbal / Phone Orders: No Diagnosis Coding ICD-10 Coding Code Description [phone] 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sed E66.01 Morbid (severe) obesity due to excess calories Follow-up  Appointments ppointment in 1 week. - Dr [name] - Room 2 Return A Anesthetic (In clinic) Topical Lidocaine 4% applied to wound bed Bathing/ Shower/ Hygiene May shower with protection but do not get wound dressing(s) wet. - Please do not get wraps on left leg wet-use a cast protector to cover leg when bathing Edema Control - Lymphedema / SCD / Other Elevate legs to the level of the heart or above for 30 minutes daily and/or when sitting, a frequency of: Avoid standing for long periods of time. Wound Treatment Wound #1 - Lower Leg Wound Laterality: Left, Anterior Cleanser: Soap and Water 1 x Per Day/30  Days Discharge Instructions: May shower and wash wound with dial antibacterial soap and water prior to dressing change. Cleanser: Wound Cleanser (Generic) 1 x Per Day/30 Days Discharge Instructions: Cleanse the wound with wound cleanser prior to applying a clean dressing using gauze sponges, not tissue or cotton balls. Prim Dressing: KerraCel Ag Gelling Fiber Dressing, 2x2 in (silver alginate) 1 x Per Day/30 Days ary Discharge Instructions: Apply silver alginate to wound bed as instructed Secondary Dressing: ALLEVYN Gentle Border, 4x4 (in/in) 1 x Per Day/30 Days Discharge Instructions: Apply over primary dressing as directed. Patient Medications llergies: No Known Allergies A Notifications Medication Indication Start End 03/28/2022 lidocaine [name], [name] ([id number]) [id number] Page 4 of 7 DOSE topical 4 % cream - cream topical Electronic Signature(s) Signed: 03/28/2022 11:14:45 AM By: Duanne Guess MD FACS Entered By: Duanne Guess on 03/28/2022 11:13:31 -------------------------------------------------------------------------------- Problem List Details Patient Name: Date of Service: [name], [name] 03/28/2022 10:45 A M Medical Record Number: [id number] Patient Account Number: [id number] Date of Birth/Sex: Treating RN: Sep 05, 1995 (26 y.o.  M) Primary Care Provider: Oralia Manis Other Clinician: Referring Provider: Treating Provider/Extender: Levi Aland in Treatment: 8 Active Problems ICD-10 Encounter Code Description Active Date MDM Diagnosis [phone] 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sed8/25/2023 No Yes E66.01 Morbid (severe) obesity due to excess calories 01/28/2022 No Yes Inactive Problems Resolved Problems Electronic Signature(s) Signed: 03/28/2022 11:10:21 AM By: Duanne Guess MD FACS Entered By: Duanne Guess on 03/28/2022 11:10:20 -------------------------------------------------------------------------------- Progress Note Details Patient Name: Date of Service: [name], [name] 03/28/2022 10:45 A M Medical Record Number: [id number] Patient Account Number: [id number] Date of Birth/Sex: Treating RN: 12-Feb-1996 (26 y.o. 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 He was in [place] and apparently was hospitalized for dehydration and possible sepsis. We do not have any records from his hospitalization, but at some point he underwent surgery and has 3 open wounds. He reports that they did this to "take cultures." Again, no records are available to know if this was done, what those results were, or what antibiotics he was given. He is now here today for further evaluation and management of 3 left lower extremity wounds. He has a wound just caudal to his knee on the  lateral aspect of his leg, another on his lateral calf, and a third on his anterior tibial surface. All of them have slough. The anterior tibial surface wound is the largest and also has some necrotic fat present. There is periwound erythema and tenderness. The placement of the incisions does not correlate with 4 compartment fasciotomy so I am not entirely sure what the thought process of the surgeons in [place] was or what specifically they were operating for. Hopefully we will receive those documents before his next visit. 02/08/2022: We have not received any information from [place]. His wounds are little bit smaller today, but still have a fair amount of slough as well as some nonviable subcutaneous tissue present. The culture that I took last week returned positive only for very low levels of methicillin sensitive Staph aureus, nonpathologic levels. 02/18/2022: The left lateral knee  wound is healed. There is just a bit of eschar on the surface that once removed, revealed complete epithelialization. The more proximal and lateral lower leg wound is quite a bit smaller with just a little bit of slough and some good robust looking granulation tissue. The largest of the wounds on the anterior tibia has a fair amount of slough buildup but has contracted quite a bit since our last visit. 02/28/2022: The more proximal lateral leg wound is flush with the surrounding skin surface and has epithelialized significantly. There is just a small open portion with a light layer of slough present. The large anterior tibial wound is much cleaner today, but still has some slough and nonviable subcutaneous tissue present. 03/14/2022: The lateral leg wound is nearly closed; there is just a small superficial opening under a layer of eschar. The large anterior tibial wound has contracted considerably. There is just a bit of slough buildup on the surface, but nowhere near to the degree as seen on previous  occasions. 03/21/2022: The lateral leg wound is healed. The anterior tibial wound has contracted further. There is some slough accumulation over hypertrophic granulation tissue. 03/28/2022: The anterior tibial wound is a little bit smaller again this week. He still continues to accumulate slough on top of some hypertrophic granulation tissue. Patient History Information obtained from Patient. Social History Former smoker, Alcohol Use - Rarely, Drug Use - No History, Caffeine Use - Moderate. Objective Constitutional No acute distress.. Vitals Time Taken: 10:51 AM, Height: 71 in, Weight: 300 lbs, BMI: 41.8, Temperature: 97.7 F, Pulse: 80 bpm, Respiratory Rate: 16 breaths/min, Blood Pressure: 118/82 mmHg. Respiratory Normal work of breathing on room air.. General Notes: 03/28/2022: The anterior tibial wound is a little bit smaller again this week. He still continues to accumulate slough on top of some hypertrophic granulation tissue. Integumentary (Hair, Skin) Wound #1 status is Open. Original cause of wound was Surgical Injury. The date acquired was: 01/04/2022. The wound has been in treatment 8 weeks. The wound is located on the Left,Anterior Lower Leg. The wound measures 3.2cm length x 1.1cm width x 0.1cm depth; 2.765cm^2 area and 0.276cm^3 volume. There is Fat Layer (Subcutaneous Tissue) exposed. There is no tunneling or undermining noted. There is a medium amount of serosanguineous drainage noted. The wound margin is distinct with the outline attached to the wound base. There is large (67-100%) red, hyper - granulation within the wound bed. There is a small (1- 33%) amount of necrotic tissue within the wound bed including Eschar and Adherent Slough. The periwound skin appearance had no abnormalities noted for moisture. The periwound skin appearance had no abnormalities noted for color. The periwound skin appearance exhibited: Scarring. Periwound temperature was noted as No  Abnormality. Assessment Active Problems ICD-10 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sed Morbid (severe) obesity due to excess calories Procedures [name], [name] ([id number]) [id number] Page 6 of 7 Wound #1 Pre-procedure diagnosis of Wound #1 is a Lesion located on the Left,Anterior Lower Leg . There was a Excisional Skin/Subcutaneous Tissue Debridement with a total area of 3.52 sq cm performed by Duanne Guess, MD. With the following instrument(s): Curette to remove Non-Viable tissue/material. Material removed includes Subcutaneous Tissue and Slough and after achieving pain control using Lidocaine 4% T opical Solution. No specimens were taken. A time out was conducted at 11:02, prior to the start of the procedure. A Minimum amount of bleeding was controlled with Pressure. The procedure was tolerated well. Post Debridement  Measurements: 3.2cm length x 1.1cm width x 0.1cm depth; 0.276cm^3 volume. Character of Wound/Ulcer Post Debridement is improved. Post procedure Diagnosis Wound #1: Same as Pre-Procedure General Notes: scribed for Dr. Lady Gary by Samuella Bruin, RN. Plan Follow-up Appointments: Return Appointment in 1 week. - Dr Lady Gary - Room 2 Anesthetic: (In clinic) Topical Lidocaine 4% applied to wound bed Bathing/ Shower/ Hygiene: May shower with protection but do not get wound dressing(s) wet. - Please do not get wraps on left leg wet-use a cast protector to cover leg when bathing Edema Control - Lymphedema / SCD / Other: Elevate legs to the level of the heart or above for 30 minutes daily and/or when sitting, a frequency of: Avoid standing for long periods of time. The following medication(s) was prescribed: lidocaine topical 4 % cream cream topical was prescribed at facility WOUND #1: - Lower Leg Wound Laterality: Left, Anterior Cleanser: Soap and Water 1 x Per Day/30 Days Discharge Instructions: May  shower and wash wound with dial antibacterial soap and water prior to dressing change. Cleanser: Wound Cleanser (Generic) 1 x Per Day/30 Days Discharge Instructions: Cleanse the wound with wound cleanser prior to applying a clean dressing using gauze sponges, not tissue or cotton balls. Prim Dressing: KerraCel Ag Gelling Fiber Dressing, 2x2 in (silver alginate) 1 x Per Day/30 Days ary Discharge Instructions: Apply silver alginate to wound bed as instructed Secondary Dressing: ALLEVYN Gentle Border, 4x4 (in/in) 1 x Per Day/30 Days Discharge Instructions: Apply over primary dressing as directed. 03/28/2022: The anterior tibial wound is a little bit smaller again this week. He still continues to accumulate slough on top of some hypertrophic granulation tissue. I used a curette to debride slough and nonviable subcutaneous tissue from his wound. As we have not had much success keeping his compression wraps in place, I am just going to use silver alginate with a foam border dressing. Follow-up in 1 week. I anticipate closure within the next 1 to 2 weeks. Electronic Signature(s) Signed: 03/28/2022 11:14:02 AM By: Duanne Guess MD FACS Entered By: Duanne Guess on 03/28/2022 11:14:02 -------------------------------------------------------------------------------- HxROS Details Patient Name: Date of Service: [name], [name] 03/28/2022 10:45 A M Medical Record Number: [id number] Patient Account Number: [id number] Date of Birth/Sex: Treating RN: 09-Jul-1995 (26 y.o. M) Primary Care Provider: Oralia Manis Other Clinician: Referring Provider: Treating Provider/Extender: Levi Aland in Treatment: 8 Information Obtained From Patient Immunizations Pneumococcal Vaccine: Received Pneumococcal Vaccination: No Implantable Devices None [name], [name] ([id number]) [id number] Page 7 of 7 Family and Social History Former smoker; Alcohol  Use: Rarely; Drug Use: No History; Caffeine Use: Moderate; Financial Concerns: No; Food, Clothing or Shelter Needs: No; Support System Lacking: No; Transportation Concerns: No Electronic Signature(s) Signed: 03/28/2022 11:14:45 AM By: Duanne Guess MD FACS Entered By: Duanne Guess on 03/28/2022 11:12:58 -------------------------------------------------------------------------------- SuperBill Details Patient Name: Date of Service: [name], [name] 03/28/2022 Medical Record Number: [id number] Patient Account Number: [id number] Date of Birth/Sex: Treating RN: 03-31-1996 (26 y.o. M) Primary Care Provider: Oralia Manis Other Clinician: Referring Provider: Treating Provider/Extender: Levi Aland in Treatment: 8 Diagnosis Coding ICD-10 Codes Code Description [phone] 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sed E66.01 Morbid (severe) obesity due to excess calories Facility Procedures : CPT4 Code: [id number] Description: 11042 - DEB SUBQ TISSUE 20 SQ CM/< ICD-10 Diagnosis Description L97.822 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 Modifier: sed Quantity: 1 Physician Procedures : CPT4 Code  Description Modifier [id number] 99213 - WC PHYS LEVEL 3 - EST PT 25 ICD-10 Diagnosis Description 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d E66.01 Morbid (severe) obesity due to excess calories Quantity: 1 : [id number] 11042 - WC PHYS SUBQ TISS 20 SQ CM ICD-10 Diagnosis Description 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d Quantity: 1 Electronic Signature(s) Signed: 03/28/2022 11:14:16 AM By: Duanne Guessannon, Kemiah Booz MD FACS Entered By: Duanne Guessannon, Belvie Iribe on 03/28/2022 11:14:16

## 2022-03-28 NOTE — Progress Notes (Signed)
ASHAR, LEWINSKI ([id number]) [id number] Page 1 of 7 Visit Report for 03/28/2022 Arrival Information Details Patient Name: Date of Service: [name], [place] 03/28/2022 10:45 A M Medical Record Number: [id number] Patient Account Number: [id number] Date of Birth/Sex: Treating RN: June 30, 1995 (26 y.o. [name] Primary Care Westyn Driggers: Oralia Manis Other Clinician: Referring Sharaya Boruff: Treating Luwanna Brossman/Extender: Levi Aland in Treatment: 8 Visit Information History Since Last Visit Added or deleted any medications: No Patient Arrived: Ambulatory Any new allergies or adverse reactions: No Arrival Time: 10:51 Had a fall or experienced change in No Accompanied By: self activities of daily living that may affect Transfer Assistance: None risk of falls: Patient Identification Verified: Yes Signs or symptoms of abuse/neglect since last visito No Secondary Verification Process Completed: Yes Hospitalized since last visit: No Patient Requires Transmission-Based Precautions: No Implantable device outside of the clinic excluding No Patient Has Alerts: No cellular tissue based products placed in the center since last visit: Has Dressing in Place as Prescribed: Yes Has Compression in Place as Prescribed: Yes Pain Present Now: No Electronic Signature(s) Signed: 03/28/2022 4:32:28 PM By: Samuella Bruin Entered By: Samuella Bruin on 03/28/2022 10:51:20 -------------------------------------------------------------------------------- Encounter Discharge Information Details Patient Name: Date of Service: HO [name], [name] 03/28/2022 10:45 A M Medical Record Number: [id number] Patient Account Number: [id number] Date of Birth/Sex: Treating RN: [birth year] (26 y.o. [name] Primary Care Kypton Eltringham: Oralia Manis Other Clinician: Referring Marybel Alcott: Treating Tama Grosz/Extender: Levi Aland in Treatment: 8 Encounter Discharge Information Items Post Procedure Vitals Discharge Condition: Stable Temperature (F): 97.7 Ambulatory Status: Ambulatory Pulse (bpm): 80 Discharge Destination: Home Respiratory Rate (breaths/min): 16 Transportation: Private Auto Blood Pressure (mmHg): 118/82 Accompanied By: self Schedule Follow-up Appointment: Yes Clinical Summary of Care: Patient Declined Electronic Signature(s) Signed: 03/28/2022 4:32:28 PM By: Samuella Bruin Entered By: Samuella Bruin on 03/28/2022 11:13:31 [name] ([id number]) [id number] Page 2 of 7 -------------------------------------------------------------------------------- Lower Extremity Assessment Details Patient Name: Date of Service: [name], [name] 03/28/2022 10:45 A M Medical Record Number: [id number] Patient Account Number: [id number] Date of Birth/Sex: Treating RN: Jun 21, 1995 (26 y.o. [name] Primary Care Imaya Duffy: Oralia Manis Other Clinician: Referring Karalynn Cottone: Treating Dhiya Smits/Extender: Levi Aland in Treatment: 8 Edema Assessment Assessed: Kyra Searles: No] Franne Forts: No] [Left: Edema] [Right: :] Calf Left: Right: Point of Measurement: From Medial Instep 50 cm Ankle Left: Right: Point of Measurement: From Medial Instep 29.5 cm Vascular Assessment Pulses: Dorsalis Pedis Palpable: [Left:Yes] Electronic Signature(s) Signed: 03/28/2022 4:32:28 PM By: Samuella Bruin Entered By: Samuella Bruin on 03/28/2022 10:57:31 -------------------------------------------------------------------------------- Multi Wound Chart Details Patient Name: Date of Service: HO [name], [name] 03/28/2022 10:45 A M Medical Record Number: [id number] Patient Account Number: [id number] Date of Birth/Sex: Treating RN: January 28, 1996 (26 y.o. M) Primary Care Itzell Bendavid: Oralia Manis Other Clinician: Referring  Dexter Sauser: Treating Fable Huisman/Extender: Levi Aland in Treatment: 8 Vital Signs Height(in): 71 Pulse(bpm): 80 Weight(lbs): 300 Blood Pressure(mmHg): 118/82 Body Mass Index(BMI): 41.8 Temperature(F): 97.7 Respiratory Rate(breaths/min): 16 [1:Photos:] [N/A:N/A] Left, Anterior Lower Leg N/A N/A Wound Location: Surgical Injury N/A N/A Wounding Event: Lesion N/A N/A Primary Etiology: 01/04/2022 N/A N/A Date Acquired: 8 N/A N/A Weeks of Treatment: Open N/A N/A Wound Status: No N/A N/A Wound Recurrence: 3.2x1.1x0.1 N/A N/A Measurements L x W x D (cm) 2.765 N/A N/A A (cm) : rea 0.276 N/A N/A Volume (cm) : 89.50% N/A N/A % Reduction in A rea: 99.30% N/A N/A % Reduction in Volume: Full Thickness Without Exposed N/A N/A  Classification: Support Structures Medium N/A N/A Exudate A mount: Serosanguineous N/A N/A Exudate Type: red, brown N/A N/A Exudate Color: Distinct, outline attached N/A N/A Wound Margin: Large (67-100%) N/A N/A Granulation A mount: Red, Hyper-granulation N/A N/A Granulation Quality: Small (1-33%) N/A N/A Necrotic A mount: Eschar, Adherent Slough N/A N/A Necrotic Tissue: Fat Layer (Subcutaneous Tissue): Yes N/A N/A Exposed Structures: Fascia: No Tendon: No Muscle: No Joint: No Bone: No Medium (34-66%) N/A N/A Epithelialization: Debridement - Excisional N/A N/A Debridement: Pre-procedure Verification/Time Out 11:02 N/A N/A Taken: Lidocaine 4% Topical Solution N/A N/A Pain Control: Subcutaneous, Slough N/A N/A Tissue Debrided: Skin/Subcutaneous Tissue N/A N/A Level: 3.52 N/A N/A Debridement A (sq cm): rea Curette N/A N/A Instrument: Minimum N/A N/A Bleeding: Pressure N/A N/A Hemostasis A chieved: Procedure was tolerated well N/A N/A Debridement Treatment Response: 3.2x1.1x0.1 N/A N/A Post Debridement Measurements L x W x D (cm) 0.276 N/A N/A Post Debridement Volume: (cm) Scarring: Yes N/A  N/A Periwound Skin Texture: No Abnormalities Noted N/A N/A Periwound Skin Moisture: Rubor: Yes N/A N/A Periwound Skin Color: No Abnormality N/A N/A Temperature: Debridement N/A N/A Procedures Performed: Treatment Notes Electronic Signature(s) Signed: 03/28/2022 11:12:06 AM By: [name] MD FACS Entered By: [name] on 03/28/2022 11:12:06 -------------------------------------------------------------------------------- Multi-Disciplinary Care Plan Details Patient Name: Date of Service: [name], [name] 03/28/2022 10:45 A M Medical Record Number: [id number] Patient Account Number: [id number] Date of Birth/Sex: Treating RN: 1995-11-14 (26 y.o. [name] Primary Care Tashica Provencio: Tera Partridge Other Clinician: Referring Quintavius Niebuhr: Treating Saron Tweed/Extender: Edwena Blow in Treatment: 8 Active Inactive Wound/Skin Impairment Nursing Diagnoses: SELVIN, YUN ([id number]) [id number] Page 4 of 7 Knowledge deficit related to ulceration/compromised skin integrity Goals: Patient/caregiver will verbalize understanding of skin care regimen Date Initiated: 01/28/2022 Target Resolution Date: 06/05/2022 Goal Status: Active Interventions: Assess ulceration(s) every visit Treatment Activities: Skin care regimen initiated : 01/28/2022 Notes: Electronic Signature(s) Signed: 03/28/2022 4:32:28 PM By: Adline Peals Entered By: Adline Peals on 03/28/2022 11:00:16 -------------------------------------------------------------------------------- Pain Assessment Details Patient Name: Date of Service: [facility], [name] 03/28/2022 10:45 A M Medical Record Number: [id number] Patient Account Number: [id number] Date of Birth/Sex: Treating RN: 04/14/1996 (26 y.o. [name] Primary Care Arrianna Catala: Tera Partridge Other Clinician: Referring Alberto Pina: Treating Tameah Mihalko/Extender: Edwena Blow in Treatment: 8 Active Problems Location of Pain Severity and Description of Pain Patient Has Paino No Site Locations Rate the pain. Current Pain Level: 0 Pain Management and Medication Current Pain Management: Electronic Signature(s) Signed: 03/28/2022 4:32:28 PM By: Adline Peals Entered By: Adline Peals on 03/28/2022 10:56:45 [name] ([id number]) [id number] Page 5 of 7 -------------------------------------------------------------------------------- Patient/Caregiver Education Details Patient Name: Date of Service: [name], [place] 10/23/2023andnbsp10:45 A M Medical Record Number: [id number] Patient Account Number: [id number] Date of Birth/Gender: Treating RN: June 23, 1995 (26 y.o. [name] Primary Care Physician: Tera Partridge Other Clinician: Referring Physician: Treating Physician/Extender: Edwena Blow in Treatment: 8 Education Assessment Education Provided To: Patient Education Topics Provided Wound/Skin Impairment: Methods: Explain/Verbal Responses: Reinforcements needed, State content correctly Electronic Signature(s) Signed: 03/28/2022 4:32:28 PM By: Adline Peals Entered By: Adline Peals on 03/28/2022 11:00:26 -------------------------------------------------------------------------------- Wound Assessment Details Patient Name: Date of Service: [facility], [name] 03/28/2022 10:45 A M Medical Record Number: [id number] Patient Account Number: [id number] Date of Birth/Sex: Treating RN: 18-Jul-1995 (26 y.o. [name] Primary Care Kitara Hebb: Tera Partridge Other Clinician: Referring Reinaldo Helt: Treating Capri Raben/Extender: Edwena Blow in Treatment: 8 Wound Status Wound Number: 1 Primary Etiology:  Lesion Wound Location: Left, Anterior Lower Leg Wound Status: Open Wounding Event: Surgical  Injury Date Acquired: 01/04/2022 Weeks Of Treatment: 8 Clustered Wound: No Photos Wound Measurements Length: (cm) Width: (cm) Depth: (cm) Area: (cm) Volume: (cm) 3.2 % Reduction in Area: 89.5% 1.1 % Reduction in Volume: 99.3% 0.1 Epithelialization: Medium (34-66%) 2.765 Tunneling: No 0.276 Undermining: No Wound Description RODDY, BELLAMY ([id number]) Classification: Full Thickness Without Exposed Support Structures Wound Margin: Distinct, outline attached Exudate Amount: Medium Exudate Type: Serosanguineous Exudate Color: red, brown [id number] Page 6 of 7 Foul Odor After Cleansing: No Slough/Fibrino Yes Wound Bed Granulation Amount: Large (67-100%) Exposed Structure Granulation Quality: Red, Hyper-granulation Fascia Exposed: No Necrotic Amount: Small (1-33%) Fat Layer (Subcutaneous Tissue) Exposed: Yes Necrotic Quality: Eschar, Adherent Slough Tendon Exposed: No Muscle Exposed: No Joint Exposed: No Bone Exposed: No Periwound Skin Texture Texture Color No Abnormalities Noted: No No Abnormalities Noted: Yes Scarring: Yes Temperature / Pain Temperature: No Abnormality Moisture No Abnormalities Noted: Yes Treatment Notes Wound #1 (Lower Leg) Wound Laterality: Left, Anterior Cleanser Soap and Water Discharge Instruction: May shower and wash wound with dial antibacterial soap and water prior to dressing change. Wound Cleanser Discharge Instruction: Cleanse the wound with wound cleanser prior to applying a clean dressing using gauze sponges, not tissue or cotton balls. Peri-Wound Care Topical Primary Dressing KerraCel Ag Gelling Fiber Dressing, 2x2 in (silver alginate) Discharge Instruction: Apply silver alginate to wound bed as instructed Secondary Dressing ALLEVYN Gentle Border, 4x4 (in/in) Discharge Instruction: Apply over primary dressing as directed. Secured With Compression Wrap Compression Stockings Geologist, engineering) Signed: 03/28/2022 4:32:28 PM By: Samuella Bruin Entered By: Samuella Bruin on 03/28/2022 11:00:00 -------------------------------------------------------------------------------- Vitals Details Patient Name: Date of Service: HO [name], [name] 03/28/2022 10:45 A M Medical Record Number: [id number] Patient Account Number: [id number] Date of Birth/Sex: Treating RN: [birth year] (26 y.o. [name] Primary Care Kamen Hanken: Oralia Manis Other Clinician: Referring Tianah Lonardo: Treating Vong Garringer/Extender: Levi Aland in Treatment: 8 Vital Signs INMAN, FETTIG ([id number]) [id number] Page 7 of 7 Time Taken: 10:51 Temperature (F): 97.7 Height (in): 71 Pulse (bpm): 80 Weight (lbs): 300 Respiratory Rate (breaths/min): 16 Body Mass Index (BMI): 41.8 Blood Pressure (mmHg): 118/82 Reference Range: 80 - 120 mg / dl Electronic Signature(s) Signed: 03/28/2022 4:32:28 PM By: Samuella Bruin Entered By: Samuella Bruin on 03/28/2022 10:56:40

## 2022-04-04 ENCOUNTER — Ambulatory Visit (HOSPITAL_BASED_OUTPATIENT_CLINIC_OR_DEPARTMENT_OTHER): Payer: 59 | Admitting: General Surgery

## 2022-04-11 ENCOUNTER — Encounter (HOSPITAL_BASED_OUTPATIENT_CLINIC_OR_DEPARTMENT_OTHER): Payer: 59 | Attending: General Surgery | Admitting: General Surgery

## 2022-04-11 DIAGNOSIS — Z6841 Body Mass Index (BMI) 40.0 and over, adult: Secondary | ICD-10-CM | POA: Diagnosis not present

## 2022-04-11 DIAGNOSIS — L97822 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sed: Secondary | ICD-10-CM | POA: Insufficient documentation

## 2022-04-11 NOTE — Progress Notes (Signed)
[name] ([id number]) [id number] Page 1 of 7 Visit Report for 04/11/2022 Arrival Information Details Patient Name: Date of Service: [name], [name] 04/11/2022 10:15 A M Medical Record Number: [id number] Patient Account Number: [id number] Date of Birth/Sex: Treating RN: [birth year] (26 y.o. [name] Primary Care [name]: [name] Other Clinician: Referring [name]: Treating [name]/Extender: [name] in Treatment: 10 Visit Information History Since Last Visit Added or deleted any medications: No Patient Arrived: Ambulatory Any new allergies or adverse reactions: No Arrival Time: 10:34 Had a fall or experienced change in No Accompanied By: self activities of daily living that may affect Transfer Assistance: None risk of falls: Patient Requires Transmission-Based Precautions: No Signs or symptoms of abuse/neglect since last visito No Patient Has Alerts: No Hospitalized since last visit: No Implantable device outside of the clinic excluding No cellular tissue based products placed in the Houston since last visit: Has Dressing in Place as Prescribed: Yes Pain Present Now: No Electronic Signature(s) Signed: 04/11/2022 4:53:53 PM By: [name] RN Entered By: [name] on 04/11/2022 10:35:03 -------------------------------------------------------------------------------- Encounter Discharge Information Details Patient Name: Date of Service: [name], [name] 04/11/2022 10:15 A M Medical Record Number: [id number] Patient Account Number: [id number] Date of Birth/Sex: Treating RN: 10-Feb-1996 (26 y.o. [name] Primary Care [name]: [name] Other Clinician: Referring [name]: Treating [name]/Extender: [name] in Treatment: 10 Encounter Discharge Information Items Post Procedure Vitals Discharge Condition: Stable Temperature (F):  98.7 Ambulatory Status: Ambulatory Pulse (bpm): 108 Discharge Destination: Home Respiratory Rate (breaths/min): 18 Transportation: Private Auto Blood Pressure (mmHg): 119/88 Accompanied By: self Schedule Follow-up Appointment: Yes Clinical Summary of Care: Patient Declined Electronic Signature(s) Signed: 04/11/2022 4:53:53 PM By: [name] RN Entered By: [name] on 04/11/2022 16:53:14 [name] ([id number]) [id number] Page 2 of 7 -------------------------------------------------------------------------------- Lower Extremity Assessment Details Patient Name: Date of Service: [name], [name] 04/11/2022 10:15 A M Medical Record Number: [id number] Patient Account Number: [id number] Date of Birth/Sex: Treating RN: [birth year] (26 y.o. [name] Primary Care [name]: [name] Other Clinician: Referring Duha Abair: Treating [name]/Extender: [name] in Treatment: 10 Edema Assessment Assessed: [name]: No] [name]: No] [Left: Edema] [Right: :] Calf Left: Right: Point of Measurement: From Medial Instep 48.5 cm Ankle Left: Right: Point of Measurement: From Medial Instep 27 cm Vascular Assessment Pulses: Dorsalis Pedis Palpable: [Left:Yes] Electronic Signature(s) Signed: 04/11/2022 4:53:53 PM By: [name] RN Entered By: [name] on 04/11/2022 10:36:37 -------------------------------------------------------------------------------- Multi Wound Chart Details Patient Name: Date of Service: [name], [name] 04/11/2022 10:15 A M Medical Record Number: [id number] Patient Account Number: [id number] Date of Birth/Sex: Treating RN: 1995-07-12 (26 y.o. M) Primary Care [name]: [name] Other Clinician: Referring [name]: Treating Arriona Prest/Extender: [name] in Treatment: 10 Vital Signs Height(in): 71 Pulse(bpm): 108 Weight(lbs):  300 Blood Pressure(mmHg): 119/88 Body Mass Index(BMI): 41.8 Temperature(F): 98.7 Respiratory Rate(breaths/min): 18 [1:Photos:] [N/A:N/A] Left, Anterior Lower Leg N/A N/A Wound Location: Surgical Injury N/A N/A Wounding Event: Lesion N/A N/A Primary Etiology: 01/04/2022 N/A N/A Date Acquired: 10 N/A N/A Weeks of Treatment: Open N/A N/A Wound Status: No N/A N/A Wound Recurrence: 2.5x1x0.1 N/A N/A Measurements L x W x D (cm) 1.963 N/A N/A A (cm) : rea 0.196 N/A N/A Volume (cm) : 92.50% N/A N/A % Reduction in A rea: 99.50% N/A N/A % Reduction in Volume: Full Thickness Without Exposed N/A N/A Classification: Support Structures Medium N/A N/A Exudate A mount: Serosanguineous N/A N/A Exudate  Type: red, brown N/A N/A Exudate Color: Distinct, outline attached N/A N/A Wound Margin: Medium (34-66%) N/A N/A Granulation A mount: Red, Hyper-granulation N/A N/A Granulation Quality: Medium (34-66%) N/A N/A Necrotic A mount: Eschar, Adherent Slough N/A N/A Necrotic Tissue: Fat Layer (Subcutaneous Tissue): Yes N/A N/A Exposed Structures: Fascia: No Tendon: No Muscle: No Joint: No Bone: No Medium (34-66%) N/A N/A Epithelialization: Debridement - Selective/Open Wound N/A N/A Debridement: Pre-procedure Verification/Time Out 10:45 N/A N/A Taken: Lidocaine 5% topical ointment N/A N/A Pain Control: Slough N/A N/A Tissue Debrided: Non-Viable Tissue N/A N/A Level: 2.5 N/A N/A Debridement A (sq cm): rea Curette N/A N/A Instrument: Minimum N/A N/A Bleeding: Pressure N/A N/A Hemostasis A chieved: 0 N/A N/A Procedural Pain: 0 N/A N/A Post Procedural Pain: Procedure was tolerated well N/A N/A Debridement Treatment Response: 2.5x1x0.1 N/A N/A Post Debridement Measurements L x W x D (cm) 0.196 N/A N/A Post Debridement Volume: (cm) Scarring: Yes N/A N/A Periwound Skin Texture: No Abnormalities Noted N/A N/A Periwound Skin Moisture: Rubor: Yes N/A  N/A Periwound Skin Color: No Abnormality N/A N/A Temperature: Debridement N/A N/A Procedures Performed: Treatment Notes Electronic Signature(s) Signed: 04/11/2022 11:11:10 AM By: Kyle Maudlin MD FACS Entered By: [name] on 04/11/2022 11:11:10 -------------------------------------------------------------------------------- Multi-Disciplinary Care Plan Details Patient Name: Date of Service: [name], [name] 04/11/2022 10:15 A M Medical Record Number: [id number] Patient Account Number: [id number] Date of Birth/Sex: Treating RN: 19-Nov-1995 (26 y.o. [name] Primary Care Ritha Sampedro: [name] Other Clinician: Referring Brode Sculley: Treating Mannie Ohlin/Extender: [name] in Treatment: [address] Inactive Wound/Skin Impairment [name], [name] ([id number]) [id number] Page 4 of 7 Nursing Diagnoses: Knowledge deficit related to ulceration/compromised skin integrity Goals: Patient/caregiver will verbalize understanding of skin care regimen Date Initiated: 01/28/2022 Target Resolution Date: 06/05/2022 Goal Status: Active Interventions: Assess ulceration(s) every visit Treatment Activities: Skin care regimen initiated : 01/28/2022 Notes: Electronic Signature(s) Signed: 04/11/2022 4:53:53 PM By: [name] RN Entered By: [name] on 04/11/2022 16:52:09 -------------------------------------------------------------------------------- Pain Assessment Details Patient Name: Date of Service: [name], [name] 04/11/2022 10:15 A M Medical Record Number: [id number] Patient Account Number: [id number] Date of Birth/Sex: Treating RN: Nov 01, 1995 (26 y.o. [name] Primary Care Quinta Eimer: [name] Other Clinician: Referring Brycelynn Stampley: Treating Celestina Gironda/Extender: [name] in Treatment: 10 Active Problems Location of Pain Severity and Description of Pain Patient  Has Paino No Site Locations Pain Management and Medication Current Pain Management: Electronic Signature(s) Signed: 04/11/2022 4:53:53 PM By: [name] RN Entered By: [name] on 04/11/2022 10:35:37 [name] ([id number]) [id number] Page 5 of 7 -------------------------------------------------------------------------------- Patient/Caregiver Education Details Patient Name: Date of Service: [name], [name] 11/6/2023andnbsp10:15 A M Medical Record Number: [id number] Patient Account Number: [id number] Date of Birth/Gender: Treating RN: September 27, 1995 (26 y.o. [name] Primary Care Physician: [name] Other Clinician: Referring Physician: Treating Physician/Extender: [name] in Treatment: 10 Education Assessment Education Provided To: Patient Education Topics Provided Wound/Skin Impairment: Methods: Explain/Verbal Responses: Return demonstration correctly Electronic Signature(s) Signed: 04/11/2022 4:53:53 PM By: [name] RN Entered By: [name] on 04/11/2022 16:52:22 -------------------------------------------------------------------------------- Wound Assessment Details Patient Name: Date of Service: [name], [name] 04/11/2022 10:15 A M Medical Record Number: [id number] Patient Account Number: [id number] Date of Birth/Sex: Treating RN: Jul 17, 1995 (26 y.o. [name] Primary Care Tristram Milian: [name] Other Clinician: Referring Tarrin Menn: Treating Nayab Aten/Extender: [name] in Treatment: 10 Wound Status Wound Number: 1 Primary Etiology: Lesion Wound Location: Left, Anterior Lower Leg Wound  Status: Open Wounding Event: Surgical Injury Date Acquired: 01/04/2022 Weeks Of Treatment: 10 Clustered Wound: No Photos Wound Measurements Length: (cm) 2. Width: (cm) 1 Houston, Kyle ([id number]) Depth: (cm) 0. Area: (cm)  1 Volume: (cm) 0 5 % Reduction in Area: 92.5% % Reduction in Volume: 99.5% [id number] Page 6 of 7 1 Epithelialization: Medium (34-66%) .963 Tunneling: No .196 Undermining: No Wound Description Classification: Full Thickness Without Exposed Support Structures Wound Margin: Distinct, outline attached Exudate Amount: Medium Exudate Type: Serosanguineous Exudate Color: red, brown Foul Odor After Cleansing: No Slough/Fibrino Yes Wound Bed Granulation Amount: Medium (34-66%) Exposed Structure Granulation Quality: Red, Hyper-granulation Fascia Exposed: No Necrotic Amount: Medium (34-66%) Fat Layer (Subcutaneous Tissue) Exposed: Yes Necrotic Quality: Eschar, Adherent Slough Tendon Exposed: No Muscle Exposed: No Joint Exposed: No Bone Exposed: No Periwound Skin Texture Texture Color No Abnormalities Noted: No No Abnormalities Noted: Yes Scarring: Yes Temperature / Pain Temperature: No Abnormality Moisture No Abnormalities Noted: Yes Treatment Notes Wound #1 (Lower Leg) Wound Laterality: Left, Anterior Cleanser Soap and Water Discharge Instruction: May shower and wash wound with dial antibacterial soap and water prior to dressing change. Wound Cleanser Discharge Instruction: Cleanse the wound with wound cleanser prior to applying a clean dressing using gauze sponges, not tissue or cotton balls. Peri-Wound Care Topical Primary Dressing KerraCel Ag Gelling Fiber Dressing, 2x2 in (silver alginate) Discharge Instruction: Apply silver alginate to wound bed as instructed Secondary Dressing ALLEVYN Gentle Border, 4x4 (in/in) Discharge Instruction: Apply over primary dressing as directed. Secured With Compression Wrap Compression Stockings Environmental education officer) Signed: 04/11/2022 4:53:53 PM By: [name] RN Entered By: [name] on 04/11/2022  10:40:25 -------------------------------------------------------------------------------- Vitals Details Patient Name: Date of Service: [name], [name] 04/11/2022 10:15 A M Medical Record Number: [id number] Patient Account Number: [id number] Date of Birth/Sex: Treating RN: [birth year] (26 y.o. [name] Primary Care Dailon Sheeran: [name] Other Clinician: Ree Houston ([id number]) [id number] Page 7 of 7 Referring Momo Braun: Treating Laksh Hinners/Extender: [name] in Treatment: 10 Vital Signs Time Taken: 10:35 Temperature (F): 98.7 Height (in): 71 Pulse (bpm): 108 Weight (lbs): 300 Respiratory Rate (breaths/min): 18 Body Mass Index (BMI): 41.8 Blood Pressure (mmHg): 119/88 Reference Range: 80 - 120 mg / dl Electronic Signature(s) Signed: 04/11/2022 4:53:53 PM By: [name] RN Entered By: [name] on 04/11/2022 10:35:30

## 2022-04-11 NOTE — Progress Notes (Signed)
TERRIE, GRAJALES ([id number]) [id number] Page 1 of 7 Visit Report for 04/11/2022 Chief Complaint Document Details Patient Name: Date of Service: [name], [place] 04/11/2022 10:15 A M Medical Record Number: [id number] Patient Account Number: [id number] Date of Birth/Sex: Treating RN: 1995-10-20 (26 y.o. M) Primary Care Provider: Oralia Manis Other Clinician: Referring Provider: Treating Provider/Extender: Levi Aland in Treatment: 10 Information Obtained from: Patient Chief Complaint Patient presents to the wound care center with open non-healing surgical wound(Kyle) Electronic Signature(Kyle) Signed: 04/11/2022 11:11:17 AM By: Duanne Guess MD FACS Entered By: Duanne Guess on 04/11/2022 11:11:17 -------------------------------------------------------------------------------- Debridement Details Patient Name: Date of Service: [name], [name] 04/11/2022 10:15 A M Medical Record Number: [id number] Patient Account Number: [id number] Date of Birth/Sex: Treating RN: 04-19-1996 (26 y.o. Dianna Limbo Primary Care Provider: Oralia Manis Other Clinician: Referring Provider: Treating Provider/Extender: Levi Aland in Treatment: 10 Debridement Performed for Assessment: Wound #1 Left,Anterior Lower Leg Performed By: Physician Duanne Guess, MD Debridement Type: Debridement Level of Consciousness (Pre-procedure): Awake and Alert Pre-procedure Verification/Time Out Yes - 10:45 Taken: Start Time: 10:45 Pain Control: Lidocaine 5% topical ointment T Area Debrided (L x W): otal 2.5 (cm) x 1 (cm) = 2.5 (cm) Tissue and other material debrided: Slough, Slough Level: Non-Viable Tissue Debridement Description: Selective/Open Wound Instrument: Curette Bleeding: Minimum Hemostasis Achieved: Pressure End Time: 10:46 Procedural Pain: 0 Post Procedural Pain: 0 Response to Treatment: Procedure was  tolerated well Level of Consciousness (Post- Awake and Alert procedure): Post Debridement Measurements of Total Wound Length: (cm) 2.5 Width: (cm) 1 Depth: (cm) 0.1 Volume: (cm) 0.196 Character of Wound/Ulcer Post Debridement: Improved Post Procedure Diagnosis [name], IN[place] ([id number]) [id number] Page 2 of 7 Same as Pre-procedure Notes Scribed for Dr. Lady Gary by J.Scotton Electronic Signature(Kyle) Signed: 04/11/2022 12:07:14 PM By: Duanne Guess MD FACS Signed: 04/11/2022 4:53:53 PM By: Karie Schwalbe RN Entered By: Karie Schwalbe on 04/11/2022 10:48:26 -------------------------------------------------------------------------------- HPI Details Patient Name: Date of Service: [name], [name] 04/11/2022 10:15 A M Medical Record Number: [id number] Patient Account Number: [id number] Date of Birth/Sex: Treating RN: [birth year] (26 y.o. 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 He was in [place] and apparently was hospitalized for dehydration and possible sepsis. We do not have any records from his hospitalization, but at some point he underwent surgery and has 3 open wounds. He reports that they did this to "take cultures." Again, no records are available to know if this was done, what those results were, or what antibiotics he was given. He is now here today for further evaluation and management of 3 left lower extremity wounds. He has a wound just caudal to his knee on the lateral aspect of his leg, another on his lateral calf, and a third on his anterior tibial surface. All of them have slough. The anterior tibial surface wound is the largest and also has some necrotic fat present. There is  periwound erythema and tenderness. The placement of the incisions does not correlate with 4 compartment fasciotomy so I am not entirely sure what the thought process of the surgeons in [place] was or what specifically they were operating for. Hopefully we will receive those documents before his next visit. 02/08/2022: We have not received any information from [place]. His wounds are little bit  smaller today, but still have a fair amount of slough as well as some nonviable subcutaneous tissue present. The culture that I took last week returned positive only for very low levels of methicillin sensitive Staph aureus, nonpathologic levels. 02/18/2022: The left lateral knee wound is healed. There is just a bit of eschar on the surface that once removed, revealed complete epithelialization. The more proximal and lateral lower leg wound is quite a bit smaller with just a little bit of slough and some good robust looking granulation tissue. The largest of the wounds on the anterior tibia has a fair amount of slough buildup but has contracted quite a bit since our last visit. 02/28/2022: The more proximal lateral leg wound is flush with the surrounding skin surface and has epithelialized significantly. There is just a small open portion with a light layer of slough present. The large anterior tibial wound is much cleaner today, but still has some slough and nonviable subcutaneous tissue present. 03/14/2022: The lateral leg wound is nearly closed; there is just a small superficial opening under a layer of eschar. The large anterior tibial wound has contracted considerably. There is just a bit of slough buildup on the surface, but nowhere near to the degree as seen on previous occasions. 03/21/2022: The lateral leg wound is healed. The anterior tibial wound has contracted further. There is some slough accumulation over hypertrophic granulation tissue. 03/28/2022: The anterior tibial wound is a little bit  smaller again this week. He still continues to accumulate slough on top of some hypertrophic granulation tissue. 04/11/2022: Continued contracture of the anterior tibial wound. There is just a little slough and eschar buildup, no hypertrophic granulation tissue. Electronic Signature(Kyle) Signed: 04/11/2022 11:11:54 AM By: Duanne Guessannon, Paris Chiriboga MD FACS Entered By: Duanne Guessannon, Dicie Edelen on 04/11/2022 11:11:53 -------------------------------------------------------------------------------- Physical Exam Details Patient Name: Date of Service: [name], [name] 04/11/2022 10:15 A M Medical Record Number: [id number] Patient Account Number: [id number] Jen MowHOLLENDER, Ranveer ([id number]) [id number] Page 3 of 7 Date of Birth/Sex: Treating RN: 08/31/1995 (26 y.o. M) Primary Care Provider: Oralia ManisKashddan, Daniel Other Clinician: Referring Provider: Treating Provider/Extender: Levi Alandannon, Semaja Lymon Kashddan, Daniel Weeks in Treatment: 10 Constitutional . Slightly tachycardic, asymptomatic. . . No acute distress.Marland Kitchen. Respiratory Normal work of breathing on room air.. Notes 04/11/2022: Continued contracture of the anterior tibial wound. There is just a little slough and eschar buildup, no hypertrophic granulation tissue. Electronic Signature(Kyle) Signed: 04/11/2022 11:12:22 AM By: Duanne Guessannon, Millissa Deese MD FACS Entered By: Duanne Guessannon, Eliott Amparan on 04/11/2022 11:12:22 -------------------------------------------------------------------------------- Physician Orders Details Patient Name: Date of Service: [name], [name] 04/11/2022 10:15 A M Medical Record Number: [id number] Patient Account Number: [id number] Date of Birth/Sex: Treating RN: 03/10/1996 (26 y.o. Dianna LimboM) Scotton, Joanne Primary Care Provider: Oralia ManisKashddan, Daniel Other Clinician: Referring Provider: Treating Provider/Extender: Levi Alandannon, Serayah Yazdani Kashddan, Daniel Weeks in Treatment: 10 Verbal / Phone Orders: No Diagnosis Coding ICD-10 Coding Code  Description 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d E66.01 Morbid (severe) obesity due to excess calories Follow-up Appointments ppointment in 1 week. - Dr Lady Garyannon - Room 2 with [facility] Return A Anesthetic (In clinic) Topical Lidocaine 4% applied to wound bed Bathing/ Shower/ Hygiene May shower with protection but do not get wound dressing(Kyle) wet. - Please do not get wraps on left leg wet-use a cast protector to cover leg when bathing Edema Control - Lymphedema / SCD / Other Elevate legs to the level of the heart or above for 30 minutes daily and/or when sitting, a frequency  of: Avoid standing for long periods of time. Wound Treatment Wound #1 - Lower Leg Wound Laterality: Left, Anterior Cleanser: Soap and Water 1 x Per Day/30 Days Discharge Instructions: May shower and wash wound with dial antibacterial soap and water prior to dressing change. Cleanser: Wound Cleanser (Generic) 1 x Per Day/30 Days Discharge Instructions: Cleanse the wound with wound cleanser prior to applying a clean dressing using gauze sponges, not tissue or cotton balls. Prim Dressing: KerraCel Ag Gelling Fiber Dressing, 2x2 in (silver alginate) 1 x Per Day/30 Days ary Discharge Instructions: Apply silver alginate to wound bed as instructed Secondary Dressing: ALLEVYN Gentle Border, 4x4 (in/in) 1 x Per Day/30 Days Discharge Instructions: Apply over primary dressing as directed. Electronic Signature(Kyle) KEAHI, MCCARNEY ([id number]) [id number] Page 4 of 7 Signed: 04/11/2022 12:07:14 PM By: [name] MD FACS Entered By: [name] on 04/11/2022 11:12:33 -------------------------------------------------------------------------------- Problem List Details Patient Name: Date of Service: [name], [name] 04/11/2022 10:15 A M Medical Record Number: [id number] Patient Account Number: [id number] Date of Birth/Sex: Treating  RN: Apr 13, 1996 (26 y.o. M) Primary Care Provider: Tera Partridge Other Clinician: Referring Provider: Treating Provider/Extender: Edwena Blow in Treatment: 10 Active Problems ICD-10 Encounter Code Description Active Date MDM Diagnosis [phone] 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sed8/25/2023 No Yes E66.01 Morbid (severe) obesity due to excess calories 01/28/2022 No Yes Inactive Problems Resolved Problems Electronic Signature(Kyle) Signed: 04/11/2022 11:11:03 AM By: [name] MD FACS Entered By: [name] on 04/11/2022 11:11:03 -------------------------------------------------------------------------------- Progress Note Details Patient Name: Date of Service: [name], [name] 04/11/2022 10:15 A M Medical Record Number: [id number] Patient Account Number: [id number] Date of Birth/Sex: Treating RN: [birth year] (26 y.o. 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 He was in [place] and apparently was hospitalized for dehydration and possible sepsis. We do not have any records from his hospitalization, but at some point he underwent surgery and has 3 open wounds. He reports that they did this to "take cultures." Again, no records are available to know if this was done, what those results were, or what antibiotics he was given. He is now here today for further evaluation and management of 3 left lower extremity wounds. He has a wound just caudal to his knee on the lateral aspect of his leg, another on his lateral calf, and a  third on his anterior tibial surface. All of them have slough. The anterior tibial surface wound is the largest and also has some necrotic fat present. There is periwound erythema and tenderness. The placement of ADEOLUWA, [place] ([id number]) [id number] Page 5 of 7 the incisions does not correlate with 4 compartment fasciotomy so I am not entirely sure what the thought process of the surgeons in [place] was or what specifically they were operating for. Hopefully we will receive those documents before his next visit. 02/08/2022: We have not received any information from [place]. His wounds are little bit smaller today, but still have a fair amount of slough as well as some nonviable subcutaneous tissue present. The culture that I took last week returned positive only for very low levels of methicillin sensitive Staph aureus, nonpathologic levels.  02/18/2022: The left lateral knee wound is healed. There is just a bit of eschar on the surface that once removed, revealed complete epithelialization. The more proximal and lateral lower leg wound is quite a bit smaller with just a little bit of slough and some good robust looking granulation tissue. The largest of the wounds on the anterior tibia has a fair amount of slough buildup but has contracted quite a bit since our last visit. 02/28/2022: The more proximal lateral leg wound is flush with the surrounding skin surface and has epithelialized significantly. There is just a small open portion with a light layer of slough present. The large anterior tibial wound is much cleaner today, but still has some slough and nonviable subcutaneous tissue present. 03/14/2022: The lateral leg wound is nearly closed; there is just a small superficial opening under a layer of eschar. The large anterior tibial wound has contracted considerably. There is just a bit of slough buildup on the surface, but nowhere near to the degree as seen on  previous occasions. 03/21/2022: The lateral leg wound is healed. The anterior tibial wound has contracted further. There is some slough accumulation over hypertrophic granulation tissue. 03/28/2022: The anterior tibial wound is a little bit smaller again this week. He still continues to accumulate slough on top of some hypertrophic granulation tissue. 04/11/2022: Continued contracture of the anterior tibial wound. There is just a little slough and eschar buildup, no hypertrophic granulation tissue. Patient History Information obtained from Patient. Social History Former smoker, Alcohol Use - Rarely, Drug Use - No History, Caffeine Use - Moderate. Objective Constitutional Slightly tachycardic, asymptomatic. No acute distress.. Vitals Time Taken: 10:35 AM, Height: 71 in, Weight: 300 lbs, BMI: 41.8, Temperature: 98.7 F, Pulse: 108 bpm, Respiratory Rate: 18 breaths/min, Blood Pressure: 119/88 mmHg. Respiratory Normal work of breathing on room air.. General Notes: 04/11/2022: Continued contracture of the anterior tibial wound. There is just a little slough and eschar buildup, no hypertrophic granulation tissue. Integumentary (Hair, Skin) Wound #1 status is Open. Original cause of wound was Surgical Injury. The date acquired was: 01/04/2022. The wound has been in treatment 10 weeks. The wound is located on the Left,Anterior Lower Leg. The wound measures 2.5cm length x 1cm width x 0.1cm depth; 1.963cm^2 area and 0.196cm^3 volume. There is Fat Layer (Subcutaneous Tissue) exposed. There is no tunneling or undermining noted. There is a medium amount of serosanguineous drainage noted. The wound margin is distinct with the outline attached to the wound base. There is medium (34-66%) red, hyper - granulation within the wound bed. There is a medium (34-66%) amount of necrotic tissue within the wound bed including Eschar and Adherent Slough. The periwound skin appearance had no abnormalities noted for  moisture. The periwound skin appearance had no abnormalities noted for color. The periwound skin appearance exhibited: Scarring. Periwound temperature was noted as No Abnormality. Assessment Active Problems ICD-10 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sed Morbid (severe) obesity due to excess calories Procedures Wound #1 Pre-procedure diagnosis of Wound #1 is a Lesion located on the Left,Anterior Lower Leg . There was a Selective/Open Wound Non-Viable Tissue Debridement with a total area of 2.5 sq cm performed by Duanne Guess, MD. With the following instrument(Kyle): Curette Material removed includes [facility] after achieving pain control using Lidocaine 5% topical ointment. No specimens were taken. A time out was conducted at 10:45, prior to the start of the procedure. A Minimum [name], [name] ([id number]) [id number] Page 6 of  7 amount of bleeding was controlled with Pressure. The procedure was tolerated well with a pain level of 0 throughout and a pain level of 0 following the procedure. Post Debridement Measurements: 2.5cm length x 1cm width x 0.1cm depth; 0.196cm^3 volume. Character of Wound/Ulcer Post Debridement is improved. Post procedure Diagnosis Wound #1: Same as Pre-Procedure General Notes: Scribed for Dr. Lady Gary by J.Scotton. Plan Follow-up Appointments: Return Appointment in 1 week. - Dr Lady Gary - Room 2 with [facility] Anesthetic: (In clinic) Topical Lidocaine 4% applied to wound bed Bathing/ Shower/ Hygiene: May shower with protection but do not get wound dressing(Kyle) wet. - Please do not get wraps on left leg wet-use a cast protector to cover leg when bathing Edema Control - Lymphedema / SCD / Other: Elevate legs to the level of the heart or above for 30 minutes daily and/or when sitting, a frequency of: Avoid standing for long periods of time. WOUND #1: - Lower Leg Wound Laterality: Left, Anterior Cleanser:  Soap and Water 1 x Per Day/30 Days Discharge Instructions: May shower and wash wound with dial antibacterial soap and water prior to dressing change. Cleanser: Wound Cleanser (Generic) 1 x Per Day/30 Days Discharge Instructions: Cleanse the wound with wound cleanser prior to applying a clean dressing using gauze sponges, not tissue or cotton balls. Prim Dressing: KerraCel Ag Gelling Fiber Dressing, 2x2 in (silver alginate) 1 x Per Day/30 Days ary Discharge Instructions: Apply silver alginate to wound bed as instructed Secondary Dressing: ALLEVYN Gentle Border, 4x4 (in/in) 1 x Per Day/30 Days Discharge Instructions: Apply over primary dressing as directed. 04/11/2022: Continued contracture of the anterior tibial wound. There is just a little slough and eschar buildup, no hypertrophic granulation tissue. I used a curette to debride slough and eschar from the wound. We will continue silver alginate and foam border dressing. Follow-up in 1 week. Electronic Signature(Kyle) Signed: 04/11/2022 11:14:20 AM By: Duanne Guess MD FACS Entered By: Duanne Guess on 04/11/2022 11:14:20 -------------------------------------------------------------------------------- HxROS Details Patient Name: Date of Service: [name], [name] 04/11/2022 10:15 A M Medical Record Number: [id number] Patient Account Number: [id number] Date of Birth/Sex: Treating RN: 06-11-1995 (26 y.o. M) Primary Care Provider: Oralia Manis Other Clinician: Referring Provider: Treating Provider/Extender: Levi Aland in Treatment: 10 Information Obtained From Patient Immunizations Pneumococcal Vaccine: Received Pneumococcal Vaccination: No Implantable Devices None Family and Social History Former smoker; Alcohol Use: Rarely; Drug Use: No History; Caffeine Use: Moderate; Financial Concerns: No; Food, Clothing or Shelter Needs: No; Support System Lacking: No; Transportation Concerns: No Electronic  Signature(Kyle) Signed: 04/11/2022 12:07:14 PM By: Duanne Guess MD FACS [place], Molly Maduro ([id number]) [id number] Page 7 of 7 Entered By: Duanne Guess on 04/11/2022 11:11:59 -------------------------------------------------------------------------------- SuperBill Details Patient Name: Date of Service: [name], [name] 04/11/2022 Medical Record Number: [id number] Patient Account Number: [id number] Date of Birth/Sex: Treating RN: August 28, 1995 (26 y.o. M) Primary Care Provider: Oralia Manis Other Clinician: Referring Provider: Treating Provider/Extender: Levi Aland in Treatment: 10 Diagnosis Coding ICD-10 Codes Code Description [phone] 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sed E66.01 Morbid (severe) obesity due to excess calories Facility Procedures : CPT4 Code: [id number] Description: 97597 - DEBRIDE WOUND 1ST 20 SQ CM OR < ICD-10 Diagnosis Description 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 Modifier: d Quantity: 1 Physician Procedures : CPT4 Code Description Modifier [id number] - WC PHYS LEVEL 3 - EST PT 25 ICD-10 Diagnosis Description L97.822  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sed E66.01 Morbid (severe) obesity due to excess calories Quantity: 1 : [id number] - WC PHYS DEBR WO ANESTH 20 SQ CM ICD-10 Diagnosis Description 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d Quantity: 1 Electronic Signature(Kyle) Signed: 04/11/2022 11:15:30 AM By: Duanne Guess MD FACS Entered By: Duanne Guess on 04/11/2022 11:15:30

## 2022-04-18 ENCOUNTER — Encounter (HOSPITAL_BASED_OUTPATIENT_CLINIC_OR_DEPARTMENT_OTHER): Payer: 59 | Admitting: General Surgery

## 2022-04-18 DIAGNOSIS — L97822 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sed: Secondary | ICD-10-CM | POA: Diagnosis not present

## 2022-04-18 NOTE — Progress Notes (Signed)
SLATE, DEBROUX ([id number]) [id number] Page 1 of 7 Visit Report for 04/18/2022 Chief Complaint Document Details Patient Name: Date of Service: [name], [place] 04/18/2022 11:00 A M Medical Record Number: [id number] Patient Account Number: [id number] Date of Birth/Sex: Treating RN: [birth year] (26 y.o. M) Primary Care Provider: Tiana Loft Other Clinician: Referring Provider: Treating Provider/Extender: Emeline Gins in Treatment: 11 Information Obtained from: Patient Chief Complaint Patient presents to the wound care center with open non-healing surgical wound(s) Electronic Signature(s) Signed: 04/18/2022 12:21:03 PM By: Duanne Guess MD FACS Entered By: Duanne Guess on 04/18/2022 12:21:01 -------------------------------------------------------------------------------- Debridement Details Patient Name: Date of Service: [name], [name] 04/18/2022 11:00 A M Medical Record Number: [id number] Patient Account Number: [id number] Date of Birth/Sex: Treating RN: July 30, 1995 (26 y.o. Dianna Limbo Primary Care Provider: Tiana Loft Other Clinician: Referring Provider: Treating Provider/Extender: Emeline Gins in Treatment: 11 Debridement Performed for Assessment: Wound #1 Left,Anterior Lower Leg Performed By: Physician Duanne Guess, MD Debridement Type: Debridement Level of Consciousness (Pre-procedure): Awake and Alert Pre-procedure Verification/Time Out Yes - 11:30 Taken: Start Time: 11:30 Pain Control: Lidocaine 4% T opical Solution T Area Debrided (L x W): otal 2.1 (cm) x 0.7 (cm) = 1.47 (cm) Tissue and other material debrided: Non-Viable, Eschar, Slough, Slough Level: Non-Viable Tissue Debridement Description: Selective/Open Wound Instrument: Curette Bleeding: Minimum Hemostasis Achieved: Pressure End Time: 11:31 Procedural Pain: 0 Post Procedural Pain: 0 Response to  Treatment: Procedure was tolerated well Level of Consciousness (Post- Awake and Alert procedure): Post Debridement Measurements of Total Wound Length: (cm) 2.1 Width: (cm) 0.7 Depth: (cm) 0.1 Volume: (cm) 0.115 Character of Wound/Ulcer Post Debridement: Improved Post Procedure Diagnosis SEMIR, BRILL ([id number]) [id number] Page 2 of 7 Same as Pre-procedure Notes Scribed for Dr. Lady Gary by J.Scotton Electronic Signature(s) Signed: 04/18/2022 1:41:14 PM By: Duanne Guess MD FACS Signed: 04/18/2022 5:48:22 PM By: Karie Schwalbe RN Entered By: Karie Schwalbe on 04/18/2022 11:34:12 -------------------------------------------------------------------------------- HPI Details Patient Name: Date of Service: Kyle LLENDER, [name] 04/18/2022 11:00 A M Medical Record Number: [id number] Patient Account Number: [id number] Date of Birth/Sex: Treating RN: April 07, 1996 (26 y.o. 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 He was in [place] and apparently was hospitalized for dehydration and possible sepsis. We do not have any records from his hospitalization, but at some point he underwent surgery and has 3 open wounds. He reports that they did this to "take cultures." Again, no records are available to know if this was done, what those results were, or what antibiotics he was given. He is now here today for further evaluation and management of 3 left lower extremity wounds. He has a wound just caudal to his knee on the lateral aspect of his leg, another on his lateral calf, and a third on his anterior tibial surface. All of them have slough. The anterior tibial surface wound is the largest and also has some  necrotic fat present. There is periwound erythema and tenderness. The placement of the incisions does not correlate with 4 compartment fasciotomy so I am not entirely sure what the thought process of the surgeons in [place] was or what specifically they were operating for. Hopefully we will receive those documents before his next visit. 02/08/2022: We have not received any information from [place]. His wounds  are little bit smaller today, but still have a fair amount of slough as well as some nonviable subcutaneous tissue present. The culture that I took last week returned positive only for very low levels of methicillin sensitive Staph aureus, nonpathologic levels. 02/18/2022: The left lateral knee wound is healed. There is just a bit of eschar on the surface that once removed, revealed complete epithelialization. The more proximal and lateral lower leg wound is quite a bit smaller with just a little bit of slough and some good robust looking granulation tissue. The largest of the wounds on the anterior tibia has a fair amount of slough buildup but has contracted quite a bit since our last visit. 02/28/2022: The more proximal lateral leg wound is flush with the surrounding skin surface and has epithelialized significantly. There is just a small open portion with a light layer of slough present. The large anterior tibial wound is much cleaner today, but still has some slough and nonviable subcutaneous tissue present. 03/14/2022: The lateral leg wound is nearly closed; there is just a small superficial opening under a layer of eschar. The large anterior tibial wound has contracted considerably. There is just a bit of slough buildup on the surface, but nowhere near to the degree as seen on previous occasions. 03/21/2022: The lateral leg wound is healed. The anterior tibial wound has contracted further. There is some slough accumulation over hypertrophic granulation tissue. 03/28/2022: The anterior  tibial wound is a little bit smaller again this week. He still continues to accumulate slough on top of some hypertrophic granulation tissue. 04/11/2022: Continued contracture of the anterior tibial wound. There is just a little slough and eschar buildup, no hypertrophic granulation tissue. 04/18/2022: The wound is smaller again this week with good granulation tissue and a little bit of slough accumulation. The periwound skin is a little bit irritated. Electronic Signature(s) Signed: 04/18/2022 12:21:46 PM By: Duanne Guess MD FACS Entered By: Duanne Guess on 04/18/2022 12:21:45 Physical Exam Details -------------------------------------------------------------------------------- [name] ([id number]) [id number] Page 3 of 7 Patient Name: Date of Service: [name], [name] 04/18/2022 11:00 A M Medical Record Number: [id number] Patient Account Number: [id number] Date of Birth/Sex: Treating RN: 1996/04/10 (26 y.o. M) Primary Care Provider: Tiana Loft Other Clinician: Referring Provider: Treating Provider/Extender: Emeline Gins in Treatment: 11 Constitutional . . . . No acute distress.Marland Kitchen Respiratory Normal work of breathing on room air.. Notes 04/18/2022: The wound is smaller again this week with good granulation tissue and a little bit of slough accumulation. The periwound skin is a little bit irritated. Electronic Signature(s) Signed: 04/18/2022 12:22:16 PM By: Duanne Guess MD FACS Entered By: Duanne Guess on 04/18/2022 12:22:16 -------------------------------------------------------------------------------- Physician Orders Details Patient Name: Date of Service: [name], [name] 04/18/2022 11:00 A M Medical Record Number: [id number] Patient Account Number: [id number] Date of Birth/Sex: Treating RN: 27-Aug-1995 (26 y.o. Dianna Limbo Primary Care Provider: Tiana Loft Other Clinician: Referring  Provider: Treating Provider/Extender: Emeline Gins in Treatment: 11 Verbal / Phone Orders: No Diagnosis Coding ICD-10 Coding Code Description [phone] 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sed E66.01 Morbid (severe) obesity due to excess calories Follow-up Appointments ppointment in 1 week. - Dr Lady Gary - Room 2 with [facility] Ridgel Return A Anesthetic (In clinic) Topical Lidocaine 4% applied to wound bed Bathing/ Shower/ Hygiene May shower with protection but do not get wound dressing(s) wet. - Please do not get wraps on left leg wet-use a  cast protector to cover leg when bathing Edema Control - Lymphedema / SCD / Other Elevate legs to the level of the heart or above for 30 minutes daily and/or when sitting, a frequency of: Avoid standing for long periods of time. Wound Treatment Wound #1 - Lower Leg Wound Laterality: Left, Anterior Cleanser: Soap and Water 1 x Per Day/30 Days Discharge Instructions: May shower and wash wound with dial antibacterial soap and water prior to dressing change. Cleanser: Wound Cleanser (Generic) 1 x Per Day/30 Days Discharge Instructions: Cleanse the wound with wound cleanser prior to applying a clean dressing using gauze sponges, not tissue or cotton balls. Peri-Wound Care: Zinc Oxide Ointment 30g tube 1 x Per Day/30 Days Discharge Instructions: Apply Zinc Oxide to periwound with each dressing change Prim Dressing: KerraCel Ag Gelling Fiber Dressing, 2x2 in (silver alginate) 1 x Per Day/30 Days ary Discharge Instructions: Apply silver alginate to wound bed as instructed Secondary Dressing: ALLEVYN Gentle Border, 4x4 (in/in) 1 x Per Day/30 Days Kyle MowHOLLENDER, [name] ([id number]) [id number] Page 4 of 7 Discharge Instructions: Apply over primary dressing as directed. Electronic Signature(s) Signed: 04/18/2022 1:41:14 PM By: Duanne Guessannon, Antolin Belsito MD FACS Entered By: Duanne Guessannon,  Keandrea Tapley on 04/18/2022 12:22:31 -------------------------------------------------------------------------------- Problem List Details Patient Name: Date of Service: Kyle Reuben LikesLLENDER, [name] 04/18/2022 11:00 A M Medical Record Number: [id number] Patient Account Number: [id number] Date of Birth/Sex: Treating RN: 11/03/1995 (26 y.o. M) Primary Care Provider: Tiana LoftKashdan, Daniel Other Clinician: Referring Provider: Treating Provider/Extender: Emeline Ginsannon, Rachelle Edwards [place], Daniel Weeks in Treatment: 11 Active Problems ICD-10 Encounter Code Description Active Date MDM Diagnosis 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d8/25/2023 No Yes E66.01 Morbid (severe) obesity due to excess calories 01/28/2022 No Yes Inactive Problems Resolved Problems Electronic Signature(s) Signed: 04/18/2022 12:02:13 PM By: Duanne Guessannon, Lothar Prehn MD FACS Entered By: Duanne Guessannon, Matteson Blue on 04/18/2022 12:02:13 -------------------------------------------------------------------------------- Progress Note Details Patient Name: Date of Service: Kyle Reuben LikesLLENDER, [name] 04/18/2022 11:00 A M Medical Record Number: [id number] Patient Account Number: [id number] Date of Birth/Sex: Treating RN: 02/23/1996 (26 y.o. 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 He was in [place] and apparently was hospitalized for dehydration and possible sepsis. We do not have any records from his hospitalization, but at some point he underwent surgery and  has 3 open wounds. He reports that they did this to "take cultures." Again, no records are available to know if this was done, what those results were, or what antibiotics he was given. He is now here today for further evaluation and management of 3 left lower extremity wounds. He has a wound just caudal to his knee on the lateral aspect of his leg, another on his lateral calf, and a third on his anterior tibial surface. All of them have slough. The anterior tibial surface wound is the largest and also has some necrotic fat present. There is periwound erythema and tenderness. The placement of the incisions does not correlate with 4 compartment fasciotomy so I am not entirely sure what the thought process of the surgeons in [place] was or what specifically they were operating for. Hopefully we will receive those documents before  his next visit. 02/08/2022: We have not received any information from [place]. His wounds are little bit smaller today, but still have a fair amount of slough as well as some nonviable subcutaneous tissue present. The culture that I took last week returned positive only for very low levels of methicillin sensitive Staph aureus, nonpathologic levels. 02/18/2022: The left lateral knee wound is healed. There is just a bit of eschar on the surface that once removed, revealed complete epithelialization. The more proximal and lateral lower leg wound is quite a bit smaller with just a little bit of slough and some good robust looking granulation tissue. The largest of the wounds on the anterior tibia has a fair amount of slough buildup but has contracted quite a bit since our last visit. 02/28/2022: The more proximal lateral leg wound is flush with the surrounding skin surface and has epithelialized significantly. There is just a small open portion with a light layer of slough present. The large anterior tibial wound is much cleaner today, but still has some slough and nonviable  subcutaneous tissue present. 03/14/2022: The lateral leg wound is nearly closed; there is just a small superficial opening under a layer of eschar. The large anterior tibial wound has contracted considerably. There is just a bit of slough buildup on the surface, but nowhere near to the degree as seen on previous occasions. 03/21/2022: The lateral leg wound is healed. The anterior tibial wound has contracted further. There is some slough accumulation over hypertrophic granulation tissue. 03/28/2022: The anterior tibial wound is a little bit smaller again this week. He still continues to accumulate slough on top of some hypertrophic granulation tissue. 04/11/2022: Continued contracture of the anterior tibial wound. There is just a little slough and eschar buildup, no hypertrophic granulation tissue. 04/18/2022: The wound is smaller again this week with good granulation tissue and a little bit of slough accumulation. The periwound skin is a little bit irritated. Patient History Information obtained from Patient. Social History Former smoker, Alcohol Use - Rarely, Drug Use - No History, Caffeine Use - Moderate. Objective Constitutional No acute distress.. Vitals Time Taken: 11:23 AM, Height: 71 in, Weight: 300 lbs, BMI: 41.8, Temperature: 98.4 F, Pulse: 98 bpm, Respiratory Rate: 18 breaths/min, Blood Pressure: 117/78 mmHg. Respiratory Normal work of breathing on room air.. General Notes: 04/18/2022: The wound is smaller again this week with good granulation tissue and a little bit of slough accumulation. The periwound skin is a little bit irritated. Integumentary (Hair, Skin) Wound #1 status is Open. Original cause of wound was Surgical Injury. The date acquired was: 01/04/2022. The wound has been in treatment 11 weeks. The wound is located on the Left,Anterior Lower Leg. The wound measures 2.1cm length x 0.7cm width x 0.1cm depth; 1.155cm^2 area and 0.115cm^3 volume. There is Fat Layer  (Subcutaneous Tissue) exposed. There is no tunneling or undermining noted. There is a medium amount of serosanguineous drainage noted. The wound margin is distinct with the outline attached to the wound base. There is medium (34-66%) red, hyper - granulation within the wound bed. There is a medium (34-66%) amount of necrotic tissue within the wound bed including Eschar and Adherent Slough. The periwound skin appearance had no abnormalities noted for moisture. The periwound skin appearance had no abnormalities noted for color. The periwound skin appearance exhibited: Scarring. Periwound temperature was noted as No Abnormality. Assessment Active Problems ICD-10 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sed Morbid (severe) obesity due  to excess calories [name], [name] ([id number]) [id number] Page 6 of 7 Procedures Wound #1 Pre-procedure diagnosis of Wound #1 is a Lesion located on the Left,Anterior Lower Leg . There was a Selective/Open Wound Non-Viable Tissue Debridement with a total area of 1.47 sq cm performed by Duanne Guess, MD. With the following instrument(s): Curette to remove Non-Viable tissue/material. Material removed includes Eschar and Slough and after achieving pain control using Lidocaine 4% T opical Solution. No specimens were taken. A time out was conducted at 11:30, prior to the start of the procedure. A Minimum amount of bleeding was controlled with Pressure. The procedure was tolerated well with a pain level of 0 throughout and a pain level of 0 following the procedure. Post Debridement Measurements: 2.1cm length x 0.7cm width x 0.1cm depth; 0.115cm^3 volume. Character of Wound/Ulcer Post Debridement is improved. Post procedure Diagnosis Wound #1: Same as Pre-Procedure General Notes: Scribed for Dr. Lady Gary by J.Scotton. Plan Follow-up Appointments: Return Appointment in 1 week. - Dr Lady Gary - Room 2 with  [facility] Anesthetic: (In clinic) Topical Lidocaine 4% applied to wound bed Bathing/ Shower/ Hygiene: May shower with protection but do not get wound dressing(s) wet. - Please do not get wraps on left leg wet-use a cast protector to cover leg when bathing Edema Control - Lymphedema / SCD / Other: Elevate legs to the level of the heart or above for 30 minutes daily and/or when sitting, a frequency of: Avoid standing for long periods of time. WOUND #1: - Lower Leg Wound Laterality: Left, Anterior Cleanser: Soap and Water 1 x Per Day/30 Days Discharge Instructions: May shower and wash wound with dial antibacterial soap and water prior to dressing change. Cleanser: Wound Cleanser (Generic) 1 x Per Day/30 Days Discharge Instructions: Cleanse the wound with wound cleanser prior to applying a clean dressing using gauze sponges, not tissue or cotton balls. Peri-Wound Care: Zinc Oxide Ointment 30g tube 1 x Per Day/30 Days Discharge Instructions: Apply Zinc Oxide to periwound with each dressing change Prim Dressing: KerraCel Ag Gelling Fiber Dressing, 2x2 in (silver alginate) 1 x Per Day/30 Days ary Discharge Instructions: Apply silver alginate to wound bed as instructed Secondary Dressing: ALLEVYN Gentle Border, 4x4 (in/in) 1 x Per Day/30 Days Discharge Instructions: Apply over primary dressing as directed. 04/18/2022: The wound is smaller again this week with good granulation tissue and a little bit of slough accumulation. The periwound skin is a little bit irritated. I used a curette to debride slough from the wound. We will continue with silver alginate and a foam border. We are going to apply some zinc oxide to the periwound as it appears that the irritation is secondary to moisture. Follow-up in 1 week. Electronic Signature(s) Signed: 04/18/2022 12:25:33 PM By: Duanne Guess MD FACS Entered By: Duanne Guess on 04/18/2022  12:25:32 -------------------------------------------------------------------------------- HxROS Details Patient Name: Date of Service: Kyle LLENDER, [name] 04/18/2022 11:00 A M Medical Record Number: [id number] Patient Account Number: [id number] Date of Birth/Sex: Treating RN: 05/10/1996 (26 y.o. M) Primary Care Provider: Tiana Loft Other Clinician: Referring Provider: Treating Provider/Extender: Emeline Gins in Treatment: 11 Information Obtained From Patient Immunizations Pneumococcal Vaccine: Received Pneumococcal Vaccination: No [name], [name] ([id number]) [id number] Page 7 of 7 Implantable Devices None Family and Social History Former smoker; Alcohol Use: Rarely; Drug Use: No History; Caffeine Use: Moderate; Financial Concerns: No; Food, Clothing or Shelter Needs: No; Support System Lacking: No; Transportation Concerns: No Psychologist, prison and probation services) Signed: 04/18/2022 1:41:14 PM By: Duanne Guess MD FACS Entered By:  Duanne Guess on 04/18/2022 12:21:55 -------------------------------------------------------------------------------- SuperBill Details Patient Name: Date of Service: Rosiland Oz 04/18/2022 Medical Record Number: [id number] Patient Account Number: [id number] Date of Birth/Sex: Treating RN: [birth year] (26 y.o. M) Primary Care Provider: Tiana Loft Other Clinician: Referring Provider: Treating Provider/Extender: Emeline Gins in Treatment: 11 Diagnosis Coding ICD-10 Codes Code Description [id number] 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sed E66.01 Morbid (severe) obesity due to excess calories Facility Procedures : CPT4 Code: [id number] Description: 97597 - DEBRIDE WOUND 1ST 20 SQ CM OR < ICD-10 Diagnosis Description 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 Modifier: d Quantity:  1 Physician Procedures : CPT4 Code Description Modifier [id number] - WC PHYS LEVEL 3 - EST PT 25 ICD-10 Diagnosis Description 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d E66.01 Morbid (severe) obesity due to excess calories Quantity: 1 : [id number] - WC PHYS DEBR WO ANESTH 20 SQ CM ICD-10 Diagnosis Description 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d Quantity: 1 Electronic Signature(s) Signed: 04/18/2022 12:26:12 PM By: Duanne Guess MD FACS Entered By: Duanne Guess on 04/18/2022 12:26:11

## 2022-04-19 NOTE — Progress Notes (Signed)
LANGFORD, CARIAS ([id number]) [id number] Page 1 of 7 Visit Report for 04/18/2022 Arrival Information Details Patient Name: Date of Service: [name], [place] 04/18/2022 11:00 A M Medical Record Number: [id number] Patient Account Number: [id number] Date of Birth/Sex: Treating RN: 1995-09-08 (26 y.o. [name] Primary Care Balraj Brayfield: Tiana Loft Other Clinician: Referring Koriana Stepien: Treating Karisha Marlin/Extender: Emeline Gins in Treatment: 11 Visit Information History Since Last Visit Added or deleted any medications: No Patient Arrived: Ambulatory Any new allergies or adverse reactions: No Arrival Time: 11:22 Had a fall or experienced change in No Accompanied By: self activities of daily living that may affect Transfer Assistance: None risk of falls: Patient Identification Verified: Yes Signs or symptoms of abuse/neglect since last visito No Secondary Verification Process Completed: Yes Hospitalized since last visit: No Patient Requires Transmission-Based Precautions: No Implantable device outside of the clinic excluding No Patient Has Alerts: No cellular tissue based products placed in the center since last visit: Has Dressing in Place as Prescribed: Yes Pain Present Now: No Electronic Signature(s) Signed: 04/19/2022 7:40:26 AM By: Samuella Bruin Entered By: Samuella Bruin on 04/18/2022 11:23:16 -------------------------------------------------------------------------------- Encounter Discharge Information Details Patient Name: Date of Service: Kyle Reuben Likes, [name] 04/18/2022 11:00 A M Medical Record Number: [id number] Patient Account Number: [id number] Date of Birth/Sex: Treating RN: 11-08-1995 (26 y.o. [name] Primary Care Caitriona Sundquist: Tiana Loft Other Clinician: Referring Karolee Meloni: Treating Ikesha Siller/Extender: Emeline Gins in Treatment: 11 Encounter Discharge  Information Items Post Procedure Vitals Discharge Condition: Stable Temperature (F): 98.4 Ambulatory Status: Ambulatory Pulse (bpm): 98 Discharge Destination: Home Respiratory Rate (breaths/min): 18 Transportation: Private Auto Blood Pressure (mmHg): 117/78 Accompanied By: self Schedule Follow-up Appointment: Yes Clinical Summary of Care: Patient Declined Electronic Signature(s) Signed: 04/18/2022 5:48:22 PM By: Karie Schwalbe RN Entered By: Karie Schwalbe on 04/18/2022 17:42:08 [name] ([id number]) [id number] Page 2 of 7 -------------------------------------------------------------------------------- Lower Extremity Assessment Details Patient Name: Date of Service: [name], [name] 04/18/2022 11:00 A M Medical Record Number: [id number] Patient Account Number: [id number] Date of Birth/Sex: Treating RN: 1995/12/21 (26 y.o. [name] Primary Care Cassey Bacigalupo: Tiana Loft Other Clinician: Referring Singleton Hickox: Treating Engelbert Sevin/Extender: Emeline Gins in Treatment: 11 Edema Assessment Assessed: [name]: No] Franne Forts: No] [Left: Edema] [Right: :] Calf Left: Right: Point of Measurement: From Medial Instep 49.1 cm Ankle Left: Right: Point of Measurement: From Medial Instep 28.5 cm Electronic Signature(s) Signed: 04/19/2022 7:40:26 AM By: Samuella Bruin Entered By: Samuella Bruin on 04/18/2022 11:27:45 -------------------------------------------------------------------------------- Multi Wound Chart Details Patient Name: Date of Service: [name], [name] 04/18/2022 11:00 A M Medical Record Number: [id number] Patient Account Number: [id number] Date of Birth/Sex: Treating RN: 12-18-1995 (26 y.o. M) Primary Care Mansel Strother: Tiana Loft Other Clinician: Referring Jeorgia Helming: Treating Amarian Botero/Extender: Emeline Gins in Treatment: 11 Vital Signs Height(in): 71 Pulse(bpm):  98 Weight(lbs): 300 Blood Pressure(mmHg): 117/78 Body Mass Index(BMI): 41.8 Temperature(F): 98.4 Respiratory Rate(breaths/min): 18 [1:Photos:] [N/A:N/A] Left, Anterior Lower Leg N/A N/A Wound Location: Surgical Injury N/A N/A Wounding Event: Lesion N/A N/A Primary Etiology: 01/04/2022 N/A N/A Date Acquired: 11 N/A N/A Weeks of Treatment: Open N/A N/A Wound Status: [name] ([id number]) [id number] Page 3 of 7 No N/A N/A Wound Recurrence: 2.1x0.7x0.1 N/A N/A Measurements L x W x D (cm) 1.155 N/A N/A A (cm) : rea 0.115 N/A N/A Volume (cm) : 95.60% N/A N/A % Reduction in Area: 99.70% N/A N/A % Reduction in Volume: Full Thickness Without Exposed N/A N/A Classification: Support Structures Medium N/A N/A  Exudate A mount: Serosanguineous N/A N/A Exudate Type: red, brown N/A N/A Exudate Color: Distinct, outline attached N/A N/A Wound Margin: Medium (34-66%) N/A N/A Granulation A mount: Red, Hyper-granulation N/A N/A Granulation Quality: Medium (34-66%) N/A N/A Necrotic A mount: Eschar, Adherent Slough N/A N/A Necrotic Tissue: Fat Layer (Subcutaneous Tissue): Yes N/A N/A Exposed Structures: Fascia: No Tendon: No Muscle: No Joint: No Bone: No Medium (34-66%) N/A N/A Epithelialization: Debridement - Selective/Open Wound N/A N/A Debridement: Pre-procedure Verification/Time Out 11:30 N/A N/A Taken: Lidocaine 4% Topical Solution N/A N/A Pain Control: Necrotic/Eschar, Slough N/A N/A Tissue Debrided: Non-Viable Tissue N/A N/A Level: 1.47 N/A N/A Debridement A (sq cm): rea Curette N/A N/A Instrument: Minimum N/A N/A Bleeding: Pressure N/A N/A Hemostasis A chieved: 0 N/A N/A Procedural Pain: 0 N/A N/A Post Procedural Pain: Procedure was tolerated well N/A N/A Debridement Treatment Response: 2.1x0.7x0.1 N/A N/A Post Debridement Measurements L x W x D (cm) 0.115 N/A N/A Post Debridement Volume: (cm) Scarring:  Yes N/A N/A Periwound Skin Texture: No Abnormalities Noted N/A N/A Periwound Skin Moisture: Rubor: Yes N/A N/A Periwound Skin Color: No Abnormality N/A N/A Temperature: Debridement N/A N/A Procedures Performed: Treatment Notes Electronic Signature(s) Signed: 04/18/2022 12:20:53 PM By: Duanne Guess MD FACS Entered By: Duanne Guess on 04/18/2022 12:20:53 -------------------------------------------------------------------------------- Multi-Disciplinary Care Plan Details Patient Name: Date of Service: [name], [name] 04/18/2022 11:00 A M Medical Record Number: [id number] Patient Account Number: [id number] Date of Birth/Sex: Treating RN: April 27, 1996 (26 y.o. [name] Primary Care Emmet Messer: Tiana Loft Other Clinician: Referring Farren Nelles: Treating Taurus Alamo/Extender: Emeline Gins in Treatment: 11 Active Inactive Wound/Skin Impairment Nursing Diagnoses: Knowledge deficit related to ulceration/compromised skin integrity Goals: Patient/caregiver will verbalize understanding of skin care regimen IMANOL, BIHL ([id number]) [id number] Page 4 of 7 Date Initiated: 01/28/2022 Target Resolution Date: 06/05/2022 Goal Status: Active Interventions: Assess ulceration(s) every visit Treatment Activities: Skin care regimen initiated : 01/28/2022 Notes: Electronic Signature(s) Signed: 04/18/2022 5:48:22 PM By: Karie Schwalbe RN Signed: 04/19/2022 7:40:26 AM By: Samuella Bruin Entered By: Karie Schwalbe on 04/18/2022 11:35:33 -------------------------------------------------------------------------------- Pain Assessment Details Patient Name: Date of Service: [name], [name] 04/18/2022 11:00 A M Medical Record Number: [id number] Patient Account Number: [id number] Date of Birth/Sex: Treating RN: [birth year] (26 y.o. [name] Primary Care Dequavius Kuhner: Tiana Loft Other Clinician: Referring  Shamari Trostel: Treating [place] Gipe/Extender: Emeline Gins in Treatment: 11 Active Problems Location of Pain Severity and Description of Pain Patient Has Paino No Site Locations Rate the pain. Current Pain Level: 0 Pain Management and Medication Current Pain Management: Electronic Signature(s) Signed: 04/19/2022 7:40:26 AM By: Samuella Bruin Entered By: Samuella Bruin on 04/18/2022 11:27:28 -------------------------------------------------------------------------------- Patient/Caregiver Education Details Patient Name: Date of Service: [facility], [name] 11/13/2023andnbsp11:00 A Genevie Cheshire ([id number]) [id number] Page 5 of 7 Medical Record Number: [id number] Patient Account Number: [id number] Date of Birth/Gender: Treating RN: 07-21-1995 (26 y.o. [name] Primary Care Physician: Tiana Loft Other Clinician: Referring Physician: Treating Physician/Extender: Emeline Gins in Treatment: 11 Education Assessment Education Provided To: Patient Education Topics Provided Wound/Skin Impairment: Methods: Explain/Verbal Responses: Reinforcements needed, State content correctly [employer]) Signed: 04/19/2022 7:40:26 AM By: Samuella Bruin Entered By: Samuella Bruin on 04/18/2022 11:24:34 -------------------------------------------------------------------------------- Wound Assessment Details Patient Name: Date of Service: [name], [name] 04/18/2022 11:00 A M Medical Record Number: [id number] Patient Account Number: [id number] Date of Birth/Sex: Treating RN: 09/07/1995 (26 y.o. [name] Primary Care Janyah Singleterry: Tiana Loft Other Clinician: Referring Concettina Leth: Treating Verlie Liotta/Extender: Lady Gary  Thalia Bloodgood, Haynes Hoehn in Treatment: 11 Wound Status Wound Number: 1 Primary Etiology: Lesion Wound Location: Left, Anterior Lower Leg Wound  Status: Open Wounding Event: Surgical Injury Date Acquired: 01/04/2022 Weeks Of Treatment: 11 Clustered Wound: No Photos Wound Measurements Length: (cm) 2.1 Width: (cm) 0.7 Depth: (cm) 0.1 Area: (cm) 1.155 Volume: (cm) 0.115 % Reduction in Area: 95.6% % Reduction in Volume: 99.7% Epithelialization: Medium (34-66%) Tunneling: No Undermining: No Wound Description Classification: Full Thickness Without Exposed Support Structures Wound Margin: Distinct, outline attached Exudate Amount: Medium Exudate Type: Serosanguineous Lehmkuhl, [name] ([id number]) Exudate Color: red, brown Foul Odor After Cleansing: No Slough/Fibrino Yes [id number] Page 6 of 7 Wound Bed Granulation Amount: Medium (34-66%) Exposed Structure Granulation Quality: Red, Hyper-granulation Fascia Exposed: No Necrotic Amount: Medium (34-66%) Fat Layer (Subcutaneous Tissue) Exposed: Yes Necrotic Quality: Eschar, Adherent Slough Tendon Exposed: No Muscle Exposed: No Joint Exposed: No Bone Exposed: No Periwound Skin Texture Texture Color No Abnormalities Noted: No No Abnormalities Noted: Yes Scarring: Yes Temperature / Pain Temperature: No Abnormality Moisture No Abnormalities Noted: Yes Treatment Notes Wound #1 (Lower Leg) Wound Laterality: Left, Anterior Cleanser Soap and Water Discharge Instruction: May shower and wash wound with dial antibacterial soap and water prior to dressing change. Wound Cleanser Discharge Instruction: Cleanse the wound with wound cleanser prior to applying a clean dressing using gauze sponges, not tissue or cotton balls. Peri-Wound Care Zinc Oxide Ointment 30g tube Discharge Instruction: Apply Zinc Oxide to periwound with each dressing change Topical Primary Dressing KerraCel Ag Gelling Fiber Dressing, 2x2 in (silver alginate) Discharge Instruction: Apply silver alginate to wound bed as instructed Secondary Dressing ALLEVYN Gentle Border, 4x4  (in/in) Discharge Instruction: Apply over primary dressing as directed. Secured With Compression Wrap Compression Stockings Facilities manager) Signed: 04/18/2022 5:48:22 PM By: Karie Schwalbe RN Signed: 04/19/2022 7:40:26 AM By: Samuella Bruin Entered By: Karie Schwalbe on 04/18/2022 11:38:18 -------------------------------------------------------------------------------- Vitals Details Patient Name: Date of Service: [name], [name] 04/18/2022 11:00 A M Medical Record Number: [id number] Patient Account Number: [id number] Date of Birth/Sex: Treating RN: 10-04-1995 (26 y.o. [name] Primary Care Edessa Jakubowicz: Tiana Loft Other Clinician: Referring Derico Mitton: Treating Oyinkansola Truax/Extender: Emeline Gins in Treatment: 11 Vital Signs Time Taken: 11:23 Temperature (F): 98.4 [name], [name] ([id number]) [id number] Page 7 of 7 Height (in): 71 Pulse (bpm): 98 Weight (lbs): 300 Respiratory Rate (breaths/min): 18 Body Mass Index (BMI): 41.8 Blood Pressure (mmHg): 117/78 Reference Range: 80 - 120 mg / dl Electronic Signature(s) Signed: 04/19/2022 7:40:26 AM By: Samuella Bruin Entered By: Samuella Bruin on 04/18/2022 11:25:10

## 2022-04-25 ENCOUNTER — Encounter (HOSPITAL_BASED_OUTPATIENT_CLINIC_OR_DEPARTMENT_OTHER): Payer: 59 | Admitting: General Surgery

## 2022-04-25 DIAGNOSIS — L97822 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sed: Secondary | ICD-10-CM | POA: Diagnosis not present

## 2022-04-25 NOTE — Progress Notes (Signed)
JULEN, RUBERT ([id number]) [id number] Page 1 of 7 Visit Report for 04/25/2022 Arrival Information Details Patient Name: Date of Service: [name], [place] BERT 04/25/2022 2:30 PM Medical Record Number: [id number] Patient Account Number: [id number] Date of Birth/Sex: Treating RN: 1995/09/29 (26 y.o. Dianna Limbo Primary Care Eyva Califano: Tiana Loft Other Clinician: Referring Cinde Ebert: Treating Mariadelcarmen Corella/Extender: Emeline Gins in Treatment: 12 Visit Information History Since Last Visit Added or deleted any medications: No Patient Arrived: Ambulatory Any new allergies or adverse reactions: No Arrival Time: 15:00 Had a fall or experienced change in No Accompanied By: self activities of daily living that may affect Transfer Assistance: None risk of falls: Patient Requires Transmission-Based Precautions: No Signs or symptoms of abuse/neglect since last visito No Patient Has Alerts: No Hospitalized since last visit: No Implantable device outside of the clinic excluding No cellular tissue based products placed in the center since last visit: Has Dressing in Place as Prescribed: Yes Pain Present Now: Yes Electronic Signature(s) Signed: 04/25/2022 5:31:47 PM By: Karie Schwalbe RN Entered By: Karie Schwalbe on 04/25/2022 15:08:54 -------------------------------------------------------------------------------- Encounter Discharge Information Details Patient Name: Date of Service: HO Reuben Likes, [place] BERT 04/25/2022 2:30 PM Medical Record Number: [id number] Patient Account Number: [id number] Date of Birth/Sex: Treating RN: [birth year] (26 y.o. Dianna Limbo Primary Care [place] Cookson: Tiana Loft Other Clinician: Referring Yitta Gongaware: Treating Keisy Strickler/Extender: Emeline Gins in Treatment: 12 Encounter Discharge Information Items Post Procedure Vitals Discharge Condition: Stable Temperature (F):  98.7 Ambulatory Status: Ambulatory Pulse (bpm): 86 Discharge Destination: Home Respiratory Rate (breaths/min): 16 Transportation: Private Auto Blood Pressure (mmHg): 132/91 Accompanied By: self Schedule Follow-up Appointment: Yes Clinical Summary of Care: Patient Declined Electronic Signature(s) Signed: 04/25/2022 5:31:47 PM By: Karie Schwalbe RN Entered By: Karie Schwalbe on 04/25/2022 17:31:13 [name] ([id number]) [id number] Page 2 of 7 -------------------------------------------------------------------------------- Lower Extremity Assessment Details Patient Name: Date of Service: Rosiland Oz 04/25/2022 2:30 PM Medical Record Number: [id number] Patient Account Number: [id number] Date of Birth/Sex: Treating RN: 1995-12-10 (26 y.o. Dianna Limbo Primary Care Dwain Huhn: Tiana Loft Other Clinician: Referring Abdulrahim Siddiqi: Treating [place] Leedy/Extender: Emeline Gins in Treatment: 12 Edema Assessment Assessed: Kyra Searles: No] Franne Forts: No] [Left: Edema] [Right: :] Calf Left: Right: Point of Measurement: From Medial Instep 49.2 cm Ankle Left: Right: Point of Measurement: From Medial Instep 28.3 cm Vascular Assessment Pulses: Dorsalis Pedis Palpable: [Left:Yes] Electronic Signature(s) Signed: 04/25/2022 5:31:47 PM By: Karie Schwalbe RN Entered By: Karie Schwalbe on 04/25/2022 15:10:42 -------------------------------------------------------------------------------- Multi Wound Chart Details Patient Name: Date of Service: HO Reuben Likes, RO BERT 04/25/2022 2:30 PM Medical Record Number: [id number] Patient Account Number: [id number] Date of Birth/Sex: Treating RN: Aug 07, 1995 (26 y.o. M) Primary Care Bethzy Hauck: Tiana Loft Other Clinician: Referring Adhira Jamil: Treating Bentleigh Stankus/Extender: Emeline Gins in Treatment: 12 Vital Signs Height(in): 71 Pulse(bpm): 86 Weight(lbs): 300 Blood  Pressure(mmHg): 132/91 Body Mass Index(BMI): 41.8 Temperature(F): 98.7 Respiratory Rate(breaths/min): 16 [1:Photos:] [N/A:N/A] Left, Anterior Lower Leg N/A N/A Wound Location: Surgical Injury N/A N/A Wounding Event: Lesion N/A N/A Primary Etiology: 01/04/2022 N/A N/A Date Acquired: 12 N/A N/A Weeks of Treatment: Open N/A N/A Wound Status: No N/A N/A Wound Recurrence: 1.3x0.6x0.1 N/A N/A Measurements L x W x D (cm) 0.613 N/A N/A A (cm) : rea 0.061 N/A N/A Volume (cm) : 97.70% N/A N/A % Reduction in A rea: 99.80% N/A N/A % Reduction in Volume: Full Thickness Without Exposed N/A N/A Classification: Support Structures Medium N/A N/A Exudate A mount: Serosanguineous N/A N/A Exudate Type: red, brown N/A  N/A Exudate Color: Distinct, outline attached N/A N/A Wound Margin: Medium (34-66%) N/A N/A Granulation A mount: Red, Hyper-granulation N/A N/A Granulation Quality: Medium (34-66%) N/A N/A Necrotic A mount: Eschar, Adherent Slough N/A N/A Necrotic Tissue: Fat Layer (Subcutaneous Tissue): Yes N/A N/A Exposed Structures: Fascia: No Tendon: No Muscle: No Joint: No Bone: No Medium (34-66%) N/A N/A Epithelialization: Debridement - Selective/Open Wound N/A N/A Debridement: Pre-procedure Verification/Time Out 15:15 N/A N/A Taken: Lidocaine 4% Topical Solution N/A N/A Pain Control: Necrotic/Eschar, Slough N/A N/A Tissue Debrided: Non-Viable Tissue N/A N/A Level: 0.78 N/A N/A Debridement A (sq cm): rea Curette N/A N/A Instrument: Minimum N/A N/A Bleeding: Pressure N/A N/A Hemostasis A chieved: 0 N/A N/A Procedural Pain: 0 N/A N/A Post Procedural Pain: Procedure was tolerated well N/A N/A Debridement Treatment Response: 1.3x0.6x0.1 N/A N/A Post Debridement Measurements L x W x D (cm) 0.061 N/A N/A Post Debridement Volume: (cm) Scarring: Yes N/A N/A Periwound Skin Texture: No Abnormalities Noted N/A N/A Periwound Skin Moisture: Rubor: Yes  N/A N/A Periwound Skin Color: No Abnormality N/A N/A Temperature: Debridement N/A N/A Procedures Performed: Treatment Notes Electronic Signature(s) Signed: 04/25/2022 3:20:13 PM By: Duanne Guess MD FACS Entered By: Duanne Guess on 04/25/2022 15:20:13 -------------------------------------------------------------------------------- Multi-Disciplinary Care Plan Details Patient Name: Date of Service: HO LLENDER, RO BERT 04/25/2022 2:30 PM Medical Record Number: [id number] Patient Account Number: [id number] Date of Birth/Sex: Treating RN: October 01, 1995 (26 y.o. Dianna Limbo Primary Care Banyan Goodchild: Tiana Loft Other Clinician: Referring Karen Kinnard: Treating Kael Keetch/Extender: Emeline Gins in Treatment: [address] [place], Molly Maduro ([id number]) [id number] Page 4 of 7 Nursing Diagnoses: Knowledge deficit related to ulceration/compromised skin integrity Goals: Patient/caregiver will verbalize understanding of skin care regimen Date Initiated: 01/28/2022 Target Resolution Date: 06/05/2022 Goal Status: Active Interventions: Assess ulceration(s) every visit Treatment Activities: Skin care regimen initiated : 01/28/2022 Notes: Electronic Signature(s) Signed: 04/25/2022 5:31:47 PM By: Karie Schwalbe RN Entered By: Karie Schwalbe on 04/25/2022 17:29:16 -------------------------------------------------------------------------------- Pain Assessment Details Patient Name: Date of Service: HO Reuben Likes, [place] BERT 04/25/2022 2:30 PM Medical Record Number: [id number] Patient Account Number: [id number] Date of Birth/Sex: Treating RN: 1995-10-23 (26 y.o. Dianna Limbo Primary Care Ohana Birdwell: Tiana Loft Other Clinician: Referring Makina Skow: Treating Kalyan Barabas/Extender: Emeline Gins in Treatment: 12 Active Problems Location of Pain Severity and Description of Pain Patient  Has Paino No Site Locations Pain Management and Medication Current Pain Management: Electronic Signature(s) Signed: 04/25/2022 5:31:47 PM By: Karie Schwalbe RN Entered By: Karie Schwalbe on 04/25/2022 15:09:30 [name] ([id number]) [id number] Page 5 of 7 -------------------------------------------------------------------------------- Patient/Caregiver Education Details Patient Name: Date of Service: Rosiland Oz 11/20/2023andnbsp2:30 PM Medical Record Number: [id number] Patient Account Number: [id number] Date of Birth/Gender: Treating RN: 1995-07-25 (26 y.o. Dianna Limbo Primary Care Physician: Tiana Loft Other Clinician: Referring Physician: Treating Physician/Extender: Emeline Gins in Treatment: 12 Education Assessment Education Provided To: Patient Education Topics Provided Wound/Skin Impairment: Methods: Explain/Verbal Responses: Return demonstration correctly Electronic Signature(s) Signed: 04/25/2022 5:31:47 PM By: Karie Schwalbe RN Entered By: Karie Schwalbe on 04/25/2022 17:29:28 -------------------------------------------------------------------------------- Wound Assessment Details Patient Name: Date of Service: HO Reuben Likes, RO BERT 04/25/2022 2:30 PM Medical Record Number: [id number] Patient Account Number: [id number] Date of Birth/Sex: Treating RN: Nov 08, 1995 (26 y.o. Dianna Limbo Primary Care Lavida Patch: Tiana Loft Other Clinician: Referring Rosali Augello: Treating Gurtej Noyola/Extender: Emeline Gins in Treatment: 12 Wound Status Wound Number: 1 Primary Etiology: Lesion Wound Location: Left, Anterior Lower Leg Wound Status: Open Wounding Event: Surgical Injury Date  Acquired: 01/04/2022 Weeks Of Treatment: 12 Clustered Wound: No Photos Wound Measurements Length: (cm) 1.3 Width: (cm) 0.6 Mariner, Mort ([id number]) Depth: (cm) 0.1 Area: (cm)  0.613 Volume: (cm) 0.061 % Reduction in Area: 97.7% % Reduction in Volume: 99.8% [id number] Page 6 of 7 Epithelialization: Medium (34-66%) Tunneling: No Undermining: No Wound Description Classification: Full Thickness Without Exposed Support Structures Wound Margin: Distinct, outline attached Exudate Amount: Medium Exudate Type: Serosanguineous Exudate Color: red, brown Foul Odor After Cleansing: No Slough/Fibrino Yes Wound Bed Granulation Amount: Medium (34-66%) Exposed Structure Granulation Quality: Red, Hyper-granulation Fascia Exposed: No Necrotic Amount: Medium (34-66%) Fat Layer (Subcutaneous Tissue) Exposed: Yes Necrotic Quality: Eschar, Adherent Slough Tendon Exposed: No Muscle Exposed: No Joint Exposed: No Bone Exposed: No Periwound Skin Texture Texture Color No Abnormalities Noted: No No Abnormalities Noted: Yes Scarring: Yes Temperature / Pain Temperature: No Abnormality Moisture No Abnormalities Noted: Yes Treatment Notes Wound #1 (Lower Leg) Wound Laterality: Left, Anterior Cleanser Soap and Water Discharge Instruction: May shower and wash wound with dial antibacterial soap and water prior to dressing change. Wound Cleanser Discharge Instruction: Cleanse the wound with wound cleanser prior to applying a clean dressing using gauze sponges, not tissue or cotton balls. Peri-Wound Care Zinc Oxide Ointment 30g tube Discharge Instruction: Apply Zinc Oxide to periwound with each dressing change Topical Primary Dressing KerraCel Ag Gelling Fiber Dressing, 2x2 in (silver alginate) Discharge Instruction: Apply silver alginate to wound bed as instructed Secondary Dressing ALLEVYN Gentle Border, 4x4 (in/in) Discharge Instruction: Apply over primary dressing as directed. Secured With Compression Wrap Compression Stockings Facilities manager) Signed: 04/25/2022 5:31:47 PM By: Karie Schwalbe RN Entered By: Karie Schwalbe on 04/25/2022 15:13:05 -------------------------------------------------------------------------------- Vitals Details Patient Name: Date of Service: HO LLENDER, RO BERT 04/25/2022 2:30 PM Medical Record Number: [id number] Patient Account Number: [id number] GREER, KOEPPEN ([id number]) [id number] Page 7 of 7 Date of Birth/Sex: Treating RN: 1996/02/08 (26 y.o. Dianna Limbo Primary Care Avin Gibbons: Other Clinician: Tiana Loft Referring Chancey Cullinane: Treating Shereen Marton/Extender: Emeline Gins in Treatment: 12 Vital Signs Time Taken: 15:01 Temperature (F): 98.7 Height (in): 71 Pulse (bpm): 86 Weight (lbs): 300 Respiratory Rate (breaths/min): 16 Body Mass Index (BMI): 41.8 Blood Pressure (mmHg): 132/91 Reference Range: 80 - 120 mg / dl Electronic Signature(s) Signed: 04/25/2022 5:31:47 PM By: Karie Schwalbe RN Entered By: Karie Schwalbe on 04/25/2022 15:09:19

## 2022-04-25 NOTE — Progress Notes (Signed)
[name], [name] ([id number]) [id number] Page 1 of 7 Visit Report for 04/25/2022 Chief Complaint Document Details Patient Name: Date of Service: [name], [place] Houston 04/25/2022 2:30 PM Medical Record Number: [id number] Patient Account Number: [id number] Date of Birth/Sex: Treating RN: 16-Mar-1996 (26 y.o. M) Primary Care Provider: [name] Other Clinician: Referring Provider: Treating Provider/Extender: [name] in Treatment: 12 Information Obtained from: Patient Chief Complaint Patient presents to the wound care center with open non-healing surgical wound(s) Electronic Signature(s) Signed: 04/25/2022 3:20:24 PM By: [name] MD FACS Entered By: [name] on 04/25/2022 15:20:24 -------------------------------------------------------------------------------- Debridement Details Patient Name: Date of Service: [name], [name] 04/25/2022 2:30 PM Medical Record Number: [id number] Patient Account Number: [id number] Date of Birth/Sex: Treating RN: Jun 23, 1995 (26 y.o. Collene Gobble Primary Care Provider: [name] Other Clinician: Referring Provider: Treating Provider/Extender: [name] in Treatment: 12 Debridement Performed for Assessment: Wound #1 Left,Anterior Lower Leg Performed By: Physician [name], MD Debridement Type: Debridement Level of Consciousness (Pre-procedure): Awake and Alert Pre-procedure Verification/Time Out Yes - 15:15 Taken: Start Time: 15:15 Pain Control: Lidocaine 4% T opical Solution T Area Debrided (L x W): otal 1.3 (cm) x 0.6 (cm) = 0.78 (cm) Tissue and other material debrided: Non-Viable, Eschar, Slough, Slough Level: Non-Viable Tissue Debridement Description: Selective/Open Wound Instrument: Curette Bleeding: Minimum Hemostasis Achieved: Pressure End Time: 15:16 Procedural Pain: 0 Post Procedural Pain: 0 Response to  Treatment: Procedure was tolerated well Level of Consciousness (Post- Awake and Alert procedure): Post Debridement Measurements of Total Wound Length: (cm) 1.3 Width: (cm) 0.6 Depth: (cm) 0.1 Volume: (cm) 0.061 Character of Wound/Ulcer Post Debridement: Improved Post Procedure Diagnosis [name], [name] ([id number]) [id number] Page 2 of 7 Same as Pre-procedure Notes Scribed for Dr. [name] by J.Scotton Electronic Signature(s) Signed: 04/25/2022 4:40:34 PM By: [name] MD FACS Signed: 04/25/2022 5:31:47 PM By: [name] RN Entered By: [name] on 04/25/2022 15:19:50 -------------------------------------------------------------------------------- HPI Details Patient Name: Date of Service: [name], [name] 04/25/2022 2:30 PM Medical Record Number: [id number] Patient Account Number: [id number] Date of Birth/Sex: Treating RN: [birth year] (26 y.o. 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 He was in [place] and apparently was hospitalized for dehydration and possible sepsis. We do not have any records from his hospitalization, but at some point he underwent surgery and has 3 open wounds. He reports that they did this to "take cultures." Again, no records are available to know if this was done, what those results were, or what antibiotics he was given. He is now here today for further evaluation and management of 3 left lower extremity wounds. He has a wound just caudal to his knee on the lateral aspect of his leg, another on his lateral calf, and a third on his anterior tibial surface. All of them have slough. The anterior tibial surface wound is the largest and also has some  necrotic fat present. There is periwound erythema and tenderness. The placement of the incisions does not correlate with 4 compartment fasciotomy so I am not entirely sure what the thought process of the surgeons in [place] was or what specifically they were operating for. Hopefully we will receive those documents before his next visit. 02/08/2022: We have not received any information from [place]. His wounds are little bit  smaller today, but still have a fair amount of slough as well as some nonviable subcutaneous tissue present. The culture that I took last week returned positive only for very low levels of methicillin sensitive Staph aureus, nonpathologic levels. 02/18/2022: The left lateral knee wound is healed. There is just a bit of eschar on the surface that once removed, revealed complete epithelialization. The more proximal and lateral lower leg wound is quite a bit smaller with just a little bit of slough and some good robust looking granulation tissue. The largest of the wounds on the anterior tibia has a fair amount of slough buildup but has contracted quite a bit since our last visit. 02/28/2022: The more proximal lateral leg wound is flush with the surrounding skin surface and has epithelialized significantly. There is just a small open portion with a light layer of slough present. The large anterior tibial wound is much cleaner today, but still has some slough and nonviable subcutaneous tissue present. 03/14/2022: The lateral leg wound is nearly closed; there is just a small superficial opening under a layer of eschar. The large anterior tibial wound has contracted considerably. There is just a bit of slough buildup on the surface, but nowhere near to the degree as seen on previous occasions. 03/21/2022: The lateral leg wound is healed. The anterior tibial wound has contracted further. There is some slough accumulation over hypertrophic granulation tissue. 03/28/2022: The anterior  tibial wound is a little bit smaller again this week. He still continues to accumulate slough on top of some hypertrophic granulation tissue. 04/11/2022: Continued contracture of the anterior tibial wound. There is just a little slough and eschar buildup, no hypertrophic granulation tissue. 04/18/2022: The wound is smaller again this week with good granulation tissue and a little bit of slough accumulation. The periwound skin is a little bit irritated. 04/25/2022: The wound is very nearly closed. It is flush with the surrounding skin and is quite narrow. There is just a layer of eschar and slough present. The periwound skin is in better condition. Electronic Signature(s) Signed: 04/25/2022 3:20:55 PM By: [name] MD FACS Entered By: [name] on 04/25/2022 15:20:55 Ree Shay ([id number]) [id number] Page 3 of 7 -------------------------------------------------------------------------------- Physical Exam Details Patient Name: Date of Service: [name], [name] 04/25/2022 2:30 PM Medical Record Number: [id number] Patient Account Number: [id number] Date of Birth/Sex: Treating RN: [birth year] (26 y.o. M) Primary Care Provider: [name] Other Clinician: Referring Provider: Treating Provider/Extender: [name] in Treatment: 12 Constitutional . . . . No acute distress. Respiratory Normal work of breathing on room air. Notes 04/25/2022: The wound is very nearly closed. It is flush with the surrounding skin and is quite narrow. There is just a layer of eschar and slough present. The periwound skin is in better condition. Electronic Signature(s) Signed: 04/25/2022 3:21:23 PM By: [name] MD FACS Entered By: [name] on 04/25/2022 15:21:22 -------------------------------------------------------------------------------- Physician Orders Details Patient Name: Date of Service: [name], [name]  04/25/2022 2:30 PM Medical Record Number: [id number] Patient Account Number: [id number] Date of Birth/Sex: Treating RN: [birth year] (26 y.o. Collene Gobble Primary Care Provider: [name] Other Clinician: Referring Provider: Treating Provider/Extender: [name] in Treatment: 12 Verbal / Phone Orders: No Diagnosis Coding ICD-10 Coding Code Description [phone] 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sed E66.01 Morbid (severe) obesity due to excess calories Follow-up Appointments ppointment in 1 week. - Dr [name] - Room 2 with  [facility] Anesthetic (In clinic) Topical Lidocaine 4% applied to wound bed - Used in clinic Bathing/ Shower/ Hygiene May shower with protection but do not get wound dressing(s) wet. - Please do not get wraps on left leg wet-use a cast protector to cover leg when bathing Edema Control - Lymphedema / SCD / Other Elevate legs to the level of the heart or above for 30 minutes daily and/or when sitting, a frequency of: Avoid standing for long periods of time. Wound Treatment Wound #1 - Lower Leg Wound Laterality: Left, Anterior Cleanser: Soap and Water 1 x Per Day/30 Days Discharge Instructions: May shower and wash wound with dial antibacterial soap and water prior to dressing change. Cleanser: Wound Cleanser (Generic) 1 x Per Day/30 Days Discharge Instructions: Cleanse the wound with wound cleanser prior to applying a clean dressing using gauze sponges, not tissue or cotton balls. Peri-Wound Care: Zinc Oxide Ointment 30g tube 1 x Per Day/30 Days Discharge Instructions: Apply Zinc Oxide to periwound with each dressing change Prim Dressing: KerraCel Ag Gelling Fiber Dressing, 2x2 in (silver alginate) ary 1 x Per Day/30 Days [name], [name] ([id number]) [id number] Page 4 of 7 Discharge Instructions: Apply silver alginate to wound bed as instructed Secondary  Dressing: ALLEVYN Gentle Border, 4x4 (in/in) 1 x Per Day/30 Days Discharge Instructions: Apply over primary dressing as directed. Electronic Signature(s) Signed: 04/25/2022 4:40:34 PM By: [name] MD FACS Entered By: [name] on 04/25/2022 15:21:34 -------------------------------------------------------------------------------- Problem List Details Patient Name: Date of Service: [name], [name] 04/25/2022 2:30 PM Medical Record Number: [id number] Patient Account Number: [id number] Date of Birth/Sex: Treating RN: July 06, 1995 (26 y.o. M) Primary Care Provider: [name] Other Clinician: Referring Provider: Treating Provider/Extender: [name] in Treatment: 12 Active Problems ICD-10 Encounter Code Description Active Date MDM Diagnosis [phone] 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sed8/25/2023 No Yes E66.01 Morbid (severe) obesity due to excess calories 01/28/2022 No Yes Inactive Problems Resolved Problems Electronic Signature(s) Signed: 04/25/2022 3:20:06 PM By: [name] MD FACS Entered By: [name] on 04/25/2022 15:20:06 -------------------------------------------------------------------------------- Progress Note Details Patient Name: Date of Service: [name], [name] 04/25/2022 2:30 PM Medical Record Number: [id number] Patient Account Number: [id number] Date of Birth/Sex: Treating RN: Dec 26, 1995 (26 y.o. 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 He was in [place] and apparently was hospitalized for dehydration and possible sepsis. We do not have any records from his hospitalization, but at some point he underwent surgery and has 3 open wounds. He reports that they did this to "take cultures." Again, no records are available to know if this was done, what those results were, or what antibiotics he was given. He is now here today for further evaluation and management of 3 left lower extremity wounds. He has a wound just caudal to his knee on the lateral aspect of his leg, another on his lateral calf, and a third on his anterior tibial surface. All of them have slough. The anterior tibial surface wound is the largest and also has some necrotic fat present. There is periwound erythema and tenderness. The placement of  the incisions does not correlate with 4 compartment fasciotomy so I am not entirely sure what the thought process of the surgeons in [place] was or what specifically they were operating for. Hopefully we will receive those documents before his next visit. 02/08/2022: We have not received any information from [place]. His wounds are little bit smaller today, but still have a fair amount of slough as well as some nonviable subcutaneous tissue present. The culture that I took last week returned positive only for very low levels of methicillin sensitive Staph aureus, nonpathologic levels. 02/18/2022: The left lateral knee wound is healed. There is just a bit of eschar on the surface that once removed, revealed complete epithelialization. The more proximal and lateral lower leg wound is quite a bit smaller with just a little bit of slough and some good robust looking granulation tissue. The largest of the wounds on the anterior tibia has a fair amount of slough buildup but has contracted quite a bit since our last visit. 02/28/2022: The more proximal lateral leg wound is flush with the  surrounding skin surface and has epithelialized significantly. There is just a small open portion with a light layer of slough present. The large anterior tibial wound is much cleaner today, but still has some slough and nonviable subcutaneous tissue present. 03/14/2022: The lateral leg wound is nearly closed; there is just a small superficial opening under a layer of eschar. The large anterior tibial wound has contracted considerably. There is just a bit of slough buildup on the surface, but nowhere near to the degree as seen on previous occasions. 03/21/2022: The lateral leg wound is healed. The anterior tibial wound has contracted further. There is some slough accumulation over hypertrophic granulation tissue. 03/28/2022: The anterior tibial wound is a little bit smaller again this week. He still continues to accumulate slough on top of some hypertrophic granulation tissue. 04/11/2022: Continued contracture of the anterior tibial wound. There is just a little slough and eschar buildup, no hypertrophic granulation tissue. 04/18/2022: The wound is smaller again this week with good granulation tissue and a little bit of slough accumulation. The periwound skin is a little bit irritated. 04/25/2022: The wound is very nearly closed. It is flush with the surrounding skin and is quite narrow. There is just a layer of eschar and slough present. The periwound skin is in better condition. Patient History Information obtained from Patient. Social History Former smoker, Alcohol Use - Rarely, Drug Use - No History, Caffeine Use - Moderate. Objective Constitutional No acute distress. Vitals Time Taken: 3:01 PM, Height: 71 in, Weight: 300 lbs, BMI: 41.8, Temperature: 98.7 F, Pulse: 86 bpm, Respiratory Rate: 16 breaths/min, Blood Pressure: 132/91 mmHg. Respiratory Normal work of breathing on room air. General Notes: 04/25/2022: The wound is very nearly closed. It is flush with the surrounding skin and is  quite narrow. There is just a layer of eschar and slough present. The periwound skin is in better condition. Integumentary (Hair, Skin) Wound #1 status is Open. Original cause of wound was Surgical Injury. The date acquired was: 01/04/2022. The wound has been in treatment 12 weeks. The wound is located on the Left,Anterior Lower Leg. The wound measures 1.3cm length x 0.6cm width x 0.1cm depth; 0.613cm^2 area and 0.061cm^3 volume. There is Fat Layer (Subcutaneous Tissue) exposed. There is no tunneling or undermining noted. There is a medium amount of serosanguineous drainage noted. The wound margin is distinct with the outline attached to the wound base. There is  medium (34-66%) red, hyper - granulation within the wound bed. There is a medium (34-66%) amount of necrotic tissue within the wound bed including Eschar and Adherent Slough. The periwound skin appearance had no abnormalities noted for moisture. The periwound skin appearance had no abnormalities noted for color. The periwound skin appearance exhibited: Scarring. Periwound temperature was noted as No Abnormality. Assessment Active Problems ICD-10 [name], [name] ([id number]) [id number] Page 6 of 7 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sed Morbid (severe) obesity due to excess calories Procedures Wound #1 Pre-procedure diagnosis of Wound #1 is a Lesion located on the Left,Anterior Lower Leg . There was a Selective/Open Wound Non-Viable Tissue Debridement with a total area of 0.78 sq cm performed by [name], MD. With the following instrument(s): Curette to remove Non-Viable tissue/material. Material removed includes Eschar and Slough and after achieving pain control using Lidocaine 4% T opical Solution. No specimens were taken. A time out was conducted at 15:15, prior to the start of the procedure. A Minimum amount of bleeding was controlled with Pressure. The  procedure was tolerated well with a pain level of 0 throughout and a pain level of 0 following the procedure. Post Debridement Measurements: 1.3cm length x 0.6cm width x 0.1cm depth; 0.061cm^3 volume. Character of Wound/Ulcer Post Debridement is improved. Post procedure Diagnosis Wound #1: Same as Pre-Procedure General Notes: Scribed for Dr. [name] by J.Scotton. Plan Follow-up Appointments: Return Appointment in 1 week. - Dr [name] - Room 2 with [facility] Anesthetic: (In clinic) Topical Lidocaine 4% applied to wound bed - Used in clinic Bathing/ Shower/ Hygiene: May shower with protection but do not get wound dressing(s) wet. - Please do not get wraps on left leg wet-use a cast protector to cover leg when bathing Edema Control - Lymphedema / SCD / Other: Elevate legs to the level of the heart or above for 30 minutes daily and/or when sitting, a frequency of: Avoid standing for long periods of time. WOUND #1: - Lower Leg Wound Laterality: Left, Anterior Cleanser: Soap and Water 1 x Per Day/30 Days Discharge Instructions: May shower and wash wound with dial antibacterial soap and water prior to dressing change. Cleanser: Wound Cleanser (Generic) 1 x Per Day/30 Days Discharge Instructions: Cleanse the wound with wound cleanser prior to applying a clean dressing using gauze sponges, not tissue or cotton balls. Peri-Wound Care: Zinc Oxide Ointment 30g tube 1 x Per Day/30 Days Discharge Instructions: Apply Zinc Oxide to periwound with each dressing change Prim Dressing: KerraCel Ag Gelling Fiber Dressing, 2x2 in (silver alginate) 1 x Per Day/30 Days ary Discharge Instructions: Apply silver alginate to wound bed as instructed Secondary Dressing: ALLEVYN Gentle Border, 4x4 (in/in) 1 x Per Day/30 Days Discharge Instructions: Apply over primary dressing as directed. 04/25/2022: The wound is very nearly closed. It is flush with the surrounding skin and is quite narrow. There is just a layer of eschar  and slough present. The periwound skin is in better condition. I used a curette to debride slough and eschar from his wound. We will continue with silver alginate and a foam border dressing. Follow-up in 1 week, at which time he will hopefully be healed. Electronic Signature(s) Signed: 04/25/2022 3:22:05 PM By: [name] MD FACS Entered By: [name] on 04/25/2022 15:22:05 -------------------------------------------------------------------------------- HxROS Details Patient Name: Date of Service: [name], [name] 04/25/2022 2:30 PM Medical Record Number: [id number] Patient Account Number: [id number] Date of Birth/Sex: Treating RN: 1996-03-07 (26 y.o. M) Primary Care  Provider: Tiana Loft Other Clinician: Referring Provider: Treating Provider/Extender: Emeline Gins in Treatment: 12 Information Obtained From Patient [name], [name] ([id number]) [id number] Page 7 of 7 Immunizations Pneumococcal Vaccine: Received Pneumococcal Vaccination: No Implantable Devices None Family and Social History Former smoker; Alcohol Use: Rarely; Drug Use: No History; Caffeine Use: Moderate; Financial Concerns: No; Food, Clothing or Shelter Needs: No; Support System Lacking: No; Transportation Concerns: No Electronic Signature(s) Signed: 04/25/2022 4:40:34 PM By: Duanne Guess MD FACS Entered By: Duanne Guess on 04/25/2022 15:21:01 -------------------------------------------------------------------------------- SuperBill Details Patient Name: Date of Service: [name], [name] 04/25/2022 Medical Record Number: [id number] Patient Account Number: [id number] Date of Birth/Sex: Treating RN: [birth year] (26 y.o. M) Primary Care Provider: Tiana Loft Other Clinician: Referring Provider: Treating Provider/Extender: Emeline Gins in Treatment: 12 Diagnosis Coding ICD-10 Codes Code  Description [phone] 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sed E66.01 Morbid (severe) obesity due to excess calories Facility Procedures : CPT4 Code: [id number] Description: 97597 - DEBRIDE WOUND 1ST 20 SQ CM OR < ICD-10 Diagnosis Description 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 Modifier: d Quantity: 1 Physician Procedures : CPT4 Code Description Modifier [id number] - WC PHYS LEVEL 3 - EST PT 25 ICD-10 Diagnosis Description 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d E66.01 Morbid (severe) obesity due to excess calories Quantity: 1 : [id number] - WC PHYS DEBR WO ANESTH 20 SQ CM ICD-10 Diagnosis Description 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d Quantity: 1 Electronic Signature(s) Signed: 04/25/2022 3:22:20 PM By: Duanne Guess MD FACS Entered By: Duanne Guess on 04/25/2022 15:22:19

## 2022-05-06 ENCOUNTER — Encounter (HOSPITAL_BASED_OUTPATIENT_CLINIC_OR_DEPARTMENT_OTHER): Payer: 59 | Attending: General Surgery | Admitting: General Surgery

## 2022-05-06 DIAGNOSIS — Z6841 Body Mass Index (BMI) 40.0 and over, adult: Secondary | ICD-10-CM | POA: Diagnosis not present

## 2022-05-06 DIAGNOSIS — L97822 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sed: Secondary | ICD-10-CM | POA: Insufficient documentation

## 2022-05-06 DIAGNOSIS — Z87891 Personal history of nicotine dependence: Secondary | ICD-10-CM | POA: Insufficient documentation

## 2022-05-06 NOTE — Progress Notes (Signed)
AVIER, JECH ([id number]) [id number] Page 1 of 6 Visit Report for 05/06/2022 Chief Complaint Document Details Patient Name: Date of Service: [name] 05/06/2022 2:45 PM Medical Record Number: [id number] Patient Account Number: [id number] Date of Birth/Sex: Treating RN: 06-Jun-1996 (26 y.o. M) Primary Care Provider: Tiana Loft Other Clinician: Referring Provider: Treating Provider/Extender: Emeline Gins in Treatment: 14 Information Obtained from: Patient Chief Complaint Patient presents to the wound care center with open non-healing surgical wound(s) Electronic Signature(s) Signed: 05/06/2022 4:00:54 PM By: Duanne Guess MD FACS Entered By: Duanne Guess on 05/06/2022 16:00:54 -------------------------------------------------------------------------------- HPI Details Patient Name: Date of Service: [name], RO BERT 05/06/2022 2:45 PM Medical Record Number: [id number] Patient Account Number: [id number] Date of Birth/Sex: Treating RN: February 26, 1996 (26 y.o. 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 He was in [place] and apparently was hospitalized for dehydration and possible sepsis. We do not have any records from his hospitalization, but at some point he underwent surgery and has 3 open wounds. He reports that they did this to "take cultures." Again, no records are available to know if this was done, what those results were, or what antibiotics he was given. He is now here today for further evaluation and management of 3 left lower extremity wounds. He has a wound just caudal to his knee on the lateral aspect of his leg, another on his lateral calf, and a  third on his anterior tibial surface. All of them have slough. The anterior tibial surface wound is the largest and also has some necrotic fat present. There is periwound erythema and tenderness. The placement of the incisions does not correlate with 4 compartment fasciotomy so I am not entirely sure what the thought process of the surgeons in [place] was or what specifically they were operating for. Hopefully we will receive those documents before his next visit. 02/08/2022: We have not received any information from [place]. His wounds are little bit smaller today, but still have a fair amount of slough as well as some nonviable subcutaneous tissue present. The culture that I took last week returned positive only for very low levels of methicillin sensitive Staph aureus, nonpathologic levels. 02/18/2022: The left lateral knee wound is healed. There is just a bit of eschar on the surface that once removed, revealed complete epithelialization. The more proximal and lateral lower leg wound is quite a bit smaller with just a little bit of slough and some good robust looking granulation tissue. The largest of the wounds on the anterior tibia has a fair amount of slough buildup but has contracted quite a bit since our last visit. 02/28/2022: The more proximal lateral leg wound is flush with the surrounding skin surface and has epithelialized significantly. There is just a small open portion with a light layer of slough present. The large anterior tibial wound is much cleaner today, but still has some slough and nonviable subcutaneous tissue present. 03/14/2022: The lateral leg wound is nearly closed; there is just a small superficial opening under a layer of eschar. The large anterior tibial wound has contracted considerably. There is just a bit of slough buildup on the surface, but nowhere near to the degree as seen on previous occasions. 03/21/2022: The lateral leg wound is healed. The anterior tibial  wound has  contracted further. There is some slough accumulation over hypertrophic granulation tissue. 03/28/2022: The anterior tibial wound is a little bit smaller again this week. He still continues to accumulate slough on top of some hypertrophic granulation tissue. Jen MowHOLLENDER, Merle ([id number]) [id number] Page 2 of 6 04/11/2022: Continued contracture of the anterior tibial wound. There is just a little slough and eschar buildup, no hypertrophic granulation tissue. 04/18/2022: The wound is smaller again this week with good granulation tissue and a little bit of slough accumulation. The periwound skin is a little bit irritated. 04/25/2022: The wound is very nearly closed. It is flush with the surrounding skin and is quite narrow. There is just a layer of eschar and slough present. The periwound skin is in better condition. 05/06/2022: His wounds are healed. Electronic Signature(s) Signed: 05/06/2022 4:01:15 PM By: Duanne Guessannon, Osa Campoli MD FACS Entered By: Duanne Guessannon, Nafeesa Dils on 05/06/2022 16:01:15 -------------------------------------------------------------------------------- Physical Exam Details Patient Name: Date of Service: Kyle Reuben LikesLLENDER, RO BERT 05/06/2022 2:45 PM Medical Record Number: [id number] Patient Account Number: [id number] Date of Birth/Sex: Treating RN: 10/08/1995 (26 y.o. M) Primary Care Provider: Tiana LoftKashdan, Daniel Other Clinician: Referring Provider: Treating Provider/Extender: Emeline Ginsannon, Hansini Clodfelter Kashdan, Daniel Weeks in Treatment: 14 Constitutional Slightly hypertensive. He is slightly tachycardic, asymptomatic.. . . No acute distress. Respiratory Normal work of breathing on room air. Notes 05/06/2022: His wound is healed. Electronic Signature(s) Signed: 05/06/2022 4:03:19 PM By: Duanne Guessannon, Millissa Deese MD FACS Previous Signature: 05/06/2022 4:01:53 PM Version By: Duanne Guessannon, Worthington Cruzan MD FACS Entered By: Duanne Guessannon, Kristain Filo on 05/06/2022  16:03:19 -------------------------------------------------------------------------------- Physician Orders Details Patient Name: Date of Service: Kyle Reuben LikesLLENDER, RO BERT 05/06/2022 2:45 PM Medical Record Number: [id number] Patient Account Number: [id number] Date of Birth/Sex: Treating RN: 04/10/1996 (26 y.o. Marlan PalauM) Herrington, Taylor Primary Care Provider: Tiana LoftKashdan, Daniel Other Clinician: Referring Provider: Treating Provider/Extender: Emeline Ginsannon, Jamarie [place] Kashdan, Daniel Weeks in Treatment: 14 Verbal / Phone Orders: No Diagnosis Coding ICD-10 Coding Code Description 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d E66.01 Morbid (severe) obesity due to excess calories [facility] Jen MowHOLLENDER, Anikin ([id number]) [id number] Page 3 of 6 (In clinic) Topical Lidocaine 5% applied to wound bed Edema Control - Lymphedema / SCD / Other Elevate legs to the level of the heart or above for 30 minutes daily and/or when sitting, a frequency of: Avoid standing for long periods of time. Patient Medications llergies: No Known Allergies A Notifications Medication Indication Start End 05/06/2022 lidocaine DOSE topical 5 % ointment - ointment topical Electronic Signature(s) Signed: 05/06/2022 4:03:36 PM By: Duanne Guessannon, Conswella Bruney MD FACS Previous Signature: 05/06/2022 3:37:43 PM Version By: Samuella BruinHerrington, Taylor Entered By: Duanne Guessannon, Yuuki Skeens on 05/06/2022 16:03:35 -------------------------------------------------------------------------------- Problem List Details Patient Name: Date of Service: Kyle Reuben LikesLLENDER, RO BERT 05/06/2022 2:45 PM Medical Record Number: [id number] Patient Account Number: [id number] Date of Birth/Sex: Treating RN: 01/17/1996 (26 y.o. M) Primary Care Provider: Tiana LoftKashdan, Daniel Other Clinician: Referring Provider: Treating Provider/Extender: Emeline Ginsannon, Cimone Fahey Kashdan, Daniel Weeks  in Treatment: 14 Active Problems ICD-10 Encounter Code Description Active Date MDM Diagnosis 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d8/25/2023 No Yes E66.01 Morbid (severe) obesity due to excess calories 01/28/2022 No Yes Inactive Problems Resolved Problems Electronic Signature(s) Signed: 05/06/2022 4:00:36 PM By: Duanne Guessannon, Rashi Giuliani MD FACS Entered By: Duanne Guessannon, Mahi Zabriskie on 05/06/2022 16:00:36 -------------------------------------------------------------------------------- Progress Note Details Patient Name: Date of Service: Kyle Reuben LikesLLENDER, RO BERT 05/06/2022 2:45 PM Medical Record Number: [id number] Patient Account Number: [id number] Date of Birth/Sex: Treating  RN: 28-Jun-1995 (26 y.o. 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 He was in [place] and apparently was hospitalized for dehydration and possible sepsis. We do not have any records from his hospitalization, but at some point he underwent surgery and has 3 open wounds. He reports that they did this to "take cultures." Again, no records are available to know if this was done, what those results were, or what antibiotics he was given. He is now here today for further evaluation and management of 3 left lower extremity wounds. He has a wound just caudal to his knee on the lateral aspect of his leg, another on his lateral calf, and a third on his anterior tibial surface. All of them have slough. The anterior tibial surface wound  is the largest and also has some necrotic fat present. There is periwound erythema and tenderness. The placement of the incisions does not correlate with 4 compartment fasciotomy so I am not entirely sure what the thought process of the surgeons in [place] was or what specifically they were operating for. Hopefully we will receive those documents before his next visit. 02/08/2022: We have not received any information from [place]. His wounds are little bit smaller today, but still have a fair amount of slough as well as some nonviable subcutaneous tissue present. The culture that I took last week returned positive only for very low levels of methicillin sensitive Staph aureus, nonpathologic levels. 02/18/2022: The left lateral knee wound is healed. There is just a bit of eschar on the surface that once removed, revealed complete epithelialization. The more proximal and lateral lower leg wound is quite a bit smaller with just a little bit of slough and some good robust looking granulation tissue. The largest of the wounds on the anterior tibia has a fair amount of slough buildup but has contracted quite a bit since our last visit. 02/28/2022: The more proximal lateral leg wound is flush with the surrounding skin surface and has epithelialized significantly. There is just a small open portion with a light layer of slough present. The large anterior tibial wound is much cleaner today, but still has some slough and nonviable subcutaneous tissue present. 03/14/2022: The lateral leg wound is nearly closed; there is just a small superficial opening under a layer of eschar. The large anterior tibial wound has contracted considerably. There is just a bit of slough buildup on the surface, but nowhere near to the degree as seen on previous occasions. 03/21/2022: The lateral leg wound is healed. The anterior tibial wound has contracted further. There is some slough accumulation over hypertrophic  granulation tissue. 03/28/2022: The anterior tibial wound is a little bit smaller again this week. He still continues to accumulate slough on top of some hypertrophic granulation tissue. 04/11/2022: Continued contracture of the anterior tibial wound. There is just a little slough and eschar buildup, no hypertrophic granulation tissue. 04/18/2022: The wound is smaller again this week with good granulation tissue and a little bit of slough accumulation. The periwound skin is a little bit irritated. 04/25/2022: The wound is very nearly closed. It is flush with the surrounding skin and is  quite narrow. There is just a layer of eschar and slough present. The periwound skin is in better condition. 05/06/2022: His wounds are healed. Patient History Information obtained from Patient. Social History Former smoker, Alcohol Use - Rarely, Drug Use - No History, Caffeine Use - Moderate. Objective Constitutional Slightly hypertensive. He is slightly tachycardic, asymptomatic.Marland Kitchen No acute distress. Vitals Time Taken: 2:48 PM, Height: 71 in, Weight: 300 lbs, BMI: 41.8, Temperature: 98.7 F, Pulse: 105 bpm, Respiratory Rate: 18 breaths/min, Blood Pressure: 144/86 mmHg. Respiratory Normal work of breathing on room air. General Notes: 05/06/2022: His wound is healed. Integumentary (Hair, Skin) Wound #1 status is Open. Original cause of wound was Surgical Injury. The date acquired was: 01/04/2022. The wound has been in treatment 14 weeks. The wound is located on the Left,Anterior Lower Leg. The wound measures 0cm length x 0cm width x 0cm depth; 0cm^2 area and 0cm^3 volume. There is no tunneling or undermining noted. There is a none present amount of drainage noted. The wound margin is distinct with the outline attached to the wound base. There is no granulation within the wound bed. There is no necrotic tissue within the wound bed. The periwound skin appearance had no abnormalities noted for moisture. The periwound  skin appearance had no abnormalities noted for color. The periwound skin appearance exhibited: Scarring. Periwound temperature was JUNAH, YAM ([id number]) [id number] Page 5 of 6 noted as No Abnormality. Assessment Active Problems ICD-10 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sed Morbid (severe) obesity due to excess calories Plan Discharge From [facility] Services: Discharge from Wound Care Center Anesthetic: (In clinic) Topical Lidocaine 5% applied to wound bed Edema Control - Lymphedema / SCD / Other: Elevate legs to the level of the heart or above for 30 minutes daily and/or when sitting, a frequency of: Avoid standing for long periods of time. The following medication(s) was prescribed: lidocaine topical 5 % ointment ointment topical was prescribed at facility 05/06/2022: His wound is healed. We will discharge him from the wound care center. He may follow-up as needed. Electronic Signature(s) Signed: 05/06/2022 4:04:04 PM By: Duanne Guess MD FACS Entered By: Duanne Guess on 05/06/2022 16:04:04 -------------------------------------------------------------------------------- HxROS Details Patient Name: Date of Service: [name], RO BERT 05/06/2022 2:45 PM Medical Record Number: [id number] Patient Account Number: [id number] Date of Birth/Sex: Treating RN: [birth year] (26 y.o. M) Primary Care Provider: Tiana Loft Other Clinician: Referring Provider: Treating Provider/Extender: Emeline Gins in Treatment: 14 Information Obtained From Patient Immunizations Pneumococcal Vaccine: Received Pneumococcal Vaccination: No Implantable Devices None Family and Social History Former smoker; Alcohol Use: Rarely; Drug Use: No History; Caffeine Use: Moderate; Financial Concerns: No; Food, Clothing or Shelter Needs: No; Support System Lacking: No; Transportation Concerns: No Electronic  Signature(s) Signed: 05/06/2022 4:21:30 PM By: Duanne Guess MD FACS Entered By: Duanne Guess on 05/06/2022 16:01:23 [name] ([id number]) [id number] Page 6 of 6 -------------------------------------------------------------------------------- SuperBill Details Patient Name: Date of Service: [name] 05/06/2022 Medical Record Number: [id number] Patient Account Number: [id number] Date of Birth/Sex: Treating RN: 1996/05/12 (26 y.o. [name] Primary Care Provider: Tiana Loft Other Clinician: Referring Provider: Treating Provider/Extender: Emeline Gins in Treatment: 14 Diagnosis Coding ICD-10 Codes Code Description [phone] 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sed E66.01 Morbid (severe) obesity due to excess calories Facility Procedures : CPT4 Code: [id number] Description: 09323 - WOUND CARE VISIT-LEV 2 EST PT Modifier: Quantity: 1 Physician Procedures :  CPT4 Code Description Modifier [id number] [phone] - WC PHYS LEVEL 2 - EST PT ICD-10 Diagnosis Description 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d E66.01 Morbid (severe) obesity due to excess calories Quantity: 1 Electronic Signature(s) Signed: 05/06/2022 4:04:20 PM By: Duanne Guess MD FACS Previous Signature: 05/06/2022 3:37:43 PM Version By: Samuella Bruin Entered By: Duanne Guess on 05/06/2022 16:04:20

## 2022-05-06 NOTE — Progress Notes (Addendum)
ZAION, HREHA ([id number]) [id number] Page 1 of 8 Visit Report for 05/06/2022 Arrival Information Details Patient Name: Date of Service: [name] 05/06/2022 2:45 PM Medical Record Number: [id number] Patient Account Number: [id number] Date of Birth/Sex: Treating RN: [birth year] (26 y.o. [name] Primary Care Emersynn Deatley: Tiana Loft Other Clinician: Referring Tata Timmins: Treating Yarieliz Wasser/Extender: Emeline Gins in Treatment: 14 Visit Information History Since Last Visit Added or deleted any medications: No Patient Arrived: Ambulatory Any new allergies or adverse reactions: No Arrival Time: 14:48 Had a fall or experienced change in No Accompanied By: self activities of daily living that may affect Transfer Assistance: None risk of falls: Patient Identification Verified: Yes Signs or symptoms of abuse/neglect since last visito No Secondary Verification Process Completed: Yes Hospitalized since last visit: No Patient Requires Transmission-Based Precautions: No Implantable device outside of the clinic excluding No Patient Has Alerts: No cellular tissue based products placed in the center since last visit: Has Dressing in Place as Prescribed: Yes Has Compression in Place as Prescribed: Yes Pain Present Now: No Electronic Signature(s) Signed: 05/06/2022 3:37:43 PM By: Samuella Bruin Entered By: Samuella Bruin on 05/06/2022 14:48:55 -------------------------------------------------------------------------------- Clinic Level of Care Assessment Details Patient Name: Date of Service: HO Mitchel Honour 05/06/2022 2:45 PM Medical Record Number: [id number] Patient Account Number: [id number] Date of Birth/Sex: Treating RN: 05/22/1996 (26 y.o. [name] Primary Care Briyanna Billingham: Tiana Loft Other Clinician: Referring Kerin Kren: Treating Citlalic Norlander/Extender: Emeline Gins  in Treatment: 14 Clinic Level of Care Assessment Items TOOL 4 Quantity Score X- 1 0 Use when only an EandM is performed on FOLLOW-UP visit ASSESSMENTS - Nursing Assessment / Reassessment X- 1 10 Reassessment of Co-morbidities (includes updates in patient status) X- 1 5 Reassessment of Adherence to Treatment Plan ASSESSMENTS - Wound and Skin A ssessment / Reassessment X - Simple Wound Assessment / Reassessment - one wound 1 5 []  - 0 Complex Wound Assessment / Reassessment - multiple wounds []  - 0 Dermatologic / Skin Assessment (not related to wound area) ASSESSMENTS - Focused Assessment []  - 0 Circumferential Edema Measurements - multi extremities []  - 0 Nutritional Assessment / Counseling / Intervention AB, LEAMING ( ) [id number] Page 2 of 8 []  - 0 Lower Extremity Assessment (monofilament, tuning fork, pulses) []  - 0 Peripheral Arterial Disease Assessment (using hand held doppler) ASSESSMENTS - Ostomy and/or Continence Assessment and Care []  - 0 Incontinence Assessment and Management []  - 0 Ostomy Care Assessment and Management (repouching, etc.) PROCESS - Coordination of Care X - Simple Patient / Family Education for ongoing care 1 15 []  - 0 Complex (extensive) Patient / Family Education for ongoing care X- 1 10 Staff obtains , Records, T Results / Process Orders est []  - 0 Staff telephones HHA, Nursing Homes / Clarify orders / etc []  - 0 Routine Transfer to another Facility (non-emergent condition) []  - 0 Routine Hospital Admission (non-emergent condition) []  - 0 New Admissions / [name] / Ordering NPWT Apligraf, etc. , []  - 0 Emergency Hospital Admission (emergent condition) X- 1 10 Simple Discharge Coordination []  - 0 Complex (extensive) Discharge Coordination PROCESS - Special Needs []  - 0 Pediatric / Minor Patient Management []  - 0 Isolation Patient Management []  - 0 Hearing / Language /  Visual special needs []  - 0 Assessment of Community assistance (transportation, D/C planning, etc.) []  - 0 Additional assistance / Altered mentation []  - 0 Support Surface(s) Assessment (bed, cushion, seat, etc.) INTERVENTIONS - Wound Cleansing / Measurement X -  Simple Wound Cleansing - one wound 1 5 []  - 0 Complex Wound Cleansing - multiple wounds X- 1 5 Wound Imaging (photographs - any number of wounds) []  - 0 Wound Tracing (instead of photographs) []  - 0 Simple Wound Measurement - one wound []  - 0 Complex Wound Measurement - multiple wounds INTERVENTIONS - Wound Dressings []  - 0 Small Wound Dressing one or multiple wounds []  - 0 Medium Wound Dressing one or multiple wounds []  - 0 Large Wound Dressing one or multiple wounds []  - 0 Application of Medications - topical []  - 0 Application of Medications - injection INTERVENTIONS - Miscellaneous []  - 0 External ear exam []  - 0 Specimen Collection (cultures, biopsies, blood, body fluids, etc.) []  - 0 Specimen(s) / Culture(s) sent or taken to Lab for analysis []  - 0 Patient Transfer (multiple staff / Civil Service fast streamer / Similar devices) []  - 0 Simple Staple / Suture removal (25 or less) []  - 0 Complex Staple / Suture removal (26 or more) []  - 0 Hypo / Hyperglycemic Management (close monitor of Blood Glucose) Ree Shay ([id number] Page 3 of 8 []  - 0 Ankle / Brachial Index (ABI) - do not check if billed separately X- 1 5 Vital Signs Has the patient been seen at the hospital within the last three years: Yes Total Score: 70 Level Of Care: New/Established - Level 2 Electronic Signature(s) Signed: 05/06/2022 3:37:43 PM By: Adline Peals Entered By: Adline Peals on 05/06/2022 15:07:50 -------------------------------------------------------------------------------- Encounter Discharge Information Details Patient Name: Date of Service: HO Vela Prose, [place] 05/06/2022 2:45  PM Medical Record Number: [id number] Patient Account Number: [id number] Date of Birth/Sex: Treating RN: 07-21-1995 (26 y.o. [name] Primary Care Keneisha Heckart: [name] Other Clinician: Referring Imogean Ciampa: Treating Imoni Kohen/Extender: [name] in Treatment: 14 Encounter Discharge Information Items Discharge Condition: Stable Ambulatory Status: Ambulatory Discharge Destination: Home Transportation: Private Auto Accompanied By: self Schedule Follow-up Appointment: Yes Clinical Summary of Care: Patient Declined Electronic Signature(s) Signed: 05/06/2022 3:37:43 PM By: Sabas Sous By: Adline Peals on 05/06/2022 15:09:27 -------------------------------------------------------------------------------- Lower Extremity Assessment Details Patient Name: Date of Service: HO Vela Prose, [place] 05/06/2022 2:45 PM Medical Record Number: [id number] Patient Account Number: [id number] Date of Birth/Sex: Treating RN: 1995-09-26 (26 y.o. [name] Primary Care Temica Righetti: [name] Other Clinician: Referring Ahjanae Cassel: Treating Erasmo Vertz/Extender: [name] in Treatment: 14 Edema Assessment Assessed: [name]: No] [name]: No] [Left: Edema] [Right: :] Calf Left: Right: Point of Measurement: From Medial Instep 49.2 cm Ankle Left: Right: Point of Measurement: From Medial Instep 28.3 cm Electronic Signature(s) LOCHLEN, REGEHR ([id number]) [id number] Page 4 of 8 Signed: 05/06/2022 3:37:43 PM By: Sabas Sous By: Adline Peals on 05/06/2022 14:50:54 -------------------------------------------------------------------------------- Multi Wound Chart Details Patient Name: Date of Service: HO Vela Prose, [place] 05/06/2022 2:45 PM Medical Record Number: [id number] Patient Account Number: [id number] Date of Birth/Sex: Treating RN: [birth year] (26 y.o. M) Primary  Care Jaceon Heiberger: [name] Other Clinician: Referring Dorthula Bier: Treating Romir Klimowicz/Extender: [name] in Treatment: 14 Vital Signs Height(in): 71 Pulse(bpm): 105 Weight(lbs): 300 Blood Pressure(mmHg): 144/86 Body Mass Index(BMI): 41.8 Temperature(F): 98.7 Respiratory Rate(breaths/min): 18 [1:Photos:] [N/A:N/A] Left, Anterior Lower Leg N/A N/A Wound Location: Surgical Injury N/A N/A Wounding Event: Lesion N/A N/A Primary Etiology: 01/04/2022 N/A N/A Date Acquired: 14 N/A N/A Weeks of Treatment: Open N/A N/A Wound Status: No N/A N/A Wound Recurrence: 0x0x0 N/A N/A Measurements L x W x D (cm) 0 N/A N/A A (cm) : rea 0 N/A  N/A Volume (cm) : 100.00% N/A N/A % Reduction in A rea: 100.00% N/A N/A % Reduction in Volume: Full Thickness Without Exposed N/A N/A Classification: Support Structures None Present N/A N/A Exudate Amount: Distinct, outline attached N/A N/A Wound Margin: None Present (0%) N/A N/A Granulation Amount: None Present (0%) N/A N/A Necrotic Amount: Fascia: No N/A N/A Exposed Structures: Fat Layer (Subcutaneous Tissue): No Tendon: No Muscle: No Joint: No Bone: No Large (67-100%) N/A N/A Epithelialization: Scarring: Yes N/A N/A Periwound Skin Texture: No Abnormalities Noted N/A N/A Periwound Skin Moisture: Rubor: Yes N/A N/A Periwound Skin Color: No Abnormality N/A N/A Temperature: Treatment Notes Wound #1 (Lower Leg) Wound Laterality: Left, Anterior Cleanser Peri-Wound Care Topical Primary Dressing ZYIER, VIATOR ([id number]) [id number] Page 5 of 8 Secondary Dressing Secured With Compression Wrap Compression Stockings Add-Ons Electronic Signature(s) Signed: 05/06/2022 4:00:46 PM By: [name] MD FACS Entered By: [name] on 05/06/2022 16:00:46 -------------------------------------------------------------------------------- Multi-Disciplinary Care Plan  Details Patient Name: Date of Service: HO Vela Prose, [place] 05/06/2022 2:45 PM Medical Record Number: [id number] Patient Account Number: [id number] Date of Birth/Sex: Treating RN: Apr 11, 1996 (26 y.o. [name] Primary Care Maizee Reinhold: [name] Other Clinician: Referring Michaelann Gunnoe: Treating Kirrah Mustin/Extender: [name] in Treatment: 14 Active Inactive Electronic Signature(s) Signed: 05/06/2022 3:37:43 PM By: Sabas Sous By: Adline Peals on 05/06/2022 15:07:10 -------------------------------------------------------------------------------- Pain Assessment Details Patient Name: Date of Service: HO Vela Prose, [place] 05/06/2022 2:45 PM Medical Record Number: [id number] Patient Account Number: [id number] Date of Birth/Sex: Treating RN: 1995-10-15 (26 y.o. [name] Primary Care Lacey Wallman: [name] Other Clinician: Referring Insiya Oshea: Treating Mariama Saintvil/Extender: [name] in Treatment: 14 Active Problems Location of Pain Severity and Description of Pain Patient Has Paino No Site Locations Rate the pain. FOCH, WEIDA ([id number]) [id number] Page 6 of 8 Rate the pain. Current Pain Level: 0 Pain Management and Medication Current Pain Management: Electronic Signature(s) Signed: 05/06/2022 3:37:43 PM By: Adline Peals Entered By: Adline Peals on 05/06/2022 14:49:54 -------------------------------------------------------------------------------- Patient/Caregiver Education Details Patient Name: Date of Service: HO Vela Prose, [place] 12/1/2023andnbsp2:45 PM Medical Record Number: [id number] Patient Account Number: [id number] Date of Birth/Gender: Treating RN: 1995-11-26 (26 y.o. [name] Primary Care Physician: [name] Other Clinician: Referring Physician: Treating Physician/Extender: [name]  in Treatment: 14 Education Assessment Education Provided To: Patient Education Topics Provided Wound/Skin Impairment: Methods: Explain/Verbal Responses: Reinforcements needed, State content correctly [employer]) Signed: 05/06/2022 3:37:43 PM By: Adline Peals Entered By: Adline Peals on 05/06/2022 14:51:41 -------------------------------------------------------------------------------- Wound Assessment Details Patient Name: Date of Service: HO Vela Prose, [place] 05/06/2022 2:45 PM Medical Record Number: [id number] Patient Account Number: [id number] Date of Birth/Sex: Treating RN: 03-22-1996 (26 y.o. [name] Primary Care Navreet Bolda: [name] Other Clinician: Referring Shauntell Iglesia: Treating Chidera Dearcos/Extender: Thompson [place], Herbie [place] ([id number]) [id number] Page 7 of 8 Weeks in Treatment: 14 Wound Status Wound Number: 1 Primary Etiology: Lesion Wound Location: Left, Anterior Lower Leg Wound Status: Open Wounding Event: Surgical Injury Date Acquired: 01/04/2022 Weeks Of Treatment: 14 Clustered Wound: No Photos Wound Measurements Length: (cm) Width: (cm) Depth: (cm) Area: (cm) Volume: (cm) 0 % Reduction in Area: 100% 0 % Reduction in Volume: 100% 0 Epithelialization: Large (67-100%) 0 Tunneling: No 0 Undermining: No Wound Description Classification: Full Thickness Without Exposed Support Wound Margin: Distinct, outline attached Exudate Amount: None Present Structures Foul Odor After Cleansing: No Slough/Fibrino No Wound Bed Granulation Amount: None Present (0%) Exposed Structure Necrotic Amount: None Present (0%) Fascia Exposed:  No Fat Layer (Subcutaneous Tissue) Exposed: No Tendon Exposed: No Muscle Exposed: No Joint Exposed: No Bone Exposed: No Periwound Skin Texture Texture Color No Abnormalities Noted: No No Abnormalities Noted: Yes Scarring: Yes Temperature /  Pain Temperature: No Abnormality Moisture No Abnormalities Noted: Yes Electronic Signature(s) Signed: 05/06/2022 3:37:43 PM By: Adline Peals Entered By: Adline Peals on 05/06/2022 14:55:42 -------------------------------------------------------------------------------- Vitals Details Patient Name: Date of Service: HO LLENDER, RO BERT 05/06/2022 2:45 PM Medical Record Number: [id number] Patient Account Number: [id number] Date of Birth/Sex: Treating RN: 27-Jan-1996 (26 y.o. [name] Primary Care Webber Michiels: [name] Other Clinician: Referring Nikita Humble: Treating Shundra Wirsing/Extender: [name] in Treatment: [address] [place] ([id number]) [id number] Page 8 of 8 Vital Signs Time Taken: 14:48 Temperature (F): 98.7 Height (in): 71 Pulse (bpm): 105 Weight (lbs): 300 Respiratory Rate (breaths/min): 18 Body Mass Index (BMI): 41.8 Blood Pressure (mmHg): 144/86 Reference Range: 80 - 120 mg / dl Electronic Signature(s) Signed: 05/06/2022 3:37:43 PM By: Adline Peals Entered By: Adline Peals on 05/06/2022 14:49:45

## 2022-07-18 ENCOUNTER — Other Ambulatory Visit: Payer: Self-pay

## 2022-07-18 ENCOUNTER — Emergency Department (HOSPITAL_BASED_OUTPATIENT_CLINIC_OR_DEPARTMENT_OTHER)
Admission: EM | Admit: 2022-07-18 | Discharge: 2022-07-19 | Disposition: A | Discharge status: Home / Self Care | Payer: 59 | Attending: Emergency Medicine | Admitting: Emergency Medicine

## 2022-07-18 ENCOUNTER — Encounter (HOSPITAL_BASED_OUTPATIENT_CLINIC_OR_DEPARTMENT_OTHER): Payer: Self-pay

## 2022-07-18 DIAGNOSIS — J45909 Unspecified asthma, uncomplicated: Secondary | ICD-10-CM | POA: Insufficient documentation

## 2022-07-18 DIAGNOSIS — S39012A Strain of muscle, fascia and tendon of lower back, initial encounter: Secondary | ICD-10-CM | POA: Insufficient documentation

## 2022-07-18 DIAGNOSIS — X58XXXA Exposure to other specified factors, initial encounter: Secondary | ICD-10-CM | POA: Insufficient documentation

## 2022-07-18 DIAGNOSIS — Z87891 Personal history of nicotine dependence: Secondary | ICD-10-CM | POA: Insufficient documentation

## 2022-07-18 LAB — URINALYSIS, ROUTINE W REFLEX MICROSCOPIC
Bacteria, UA: NONE SEEN
Bilirubin Urine: NEGATIVE
Glucose, UA: NEGATIVE mg/dL
Hgb urine dipstick: NEGATIVE
Leukocytes,Ua: NEGATIVE
Nitrite: NEGATIVE
Protein, ur: 30 mg/dL — AB
Specific Gravity, Urine: 1.032 — ABNORMAL HIGH (ref 1.005–1.030)
pH: 6 (ref 5.0–8.0)

## 2022-07-18 NOTE — ED Triage Notes (Signed)
Patient here POV from Home.  Endorses Left Lower Back Pain that began approximately 1 Week ago. 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   No Dysuria. No Hematuria.   NAD Noted during Triage. A&Ox4. GCS 15. Ambulatory.

## 2022-07-19 MED ORDER — CYCLOBENZAPRINE HCL 10 MG PO TABS
5.0000 mg | ORAL_TABLET | Freq: Every day | ORAL | 0 refills | Status: AC
Start: 2022-07-19 — End: 2022-07-29

## 2022-07-19 NOTE — Discharge Instructions (Addendum)
You may use over-the-counter Motrin (Ibuprofen), Acetaminophen (Tylenol), topical muscle creams such as SalonPas, [employer], Bengay, etc. Please stretch, apply ice or heat (whichever helps), and have massage therapy for additional assistance.

## 2022-07-19 NOTE — ED Notes (Signed)
Patient verbalizes understanding of discharge instructions. Opportunity for questioning and answers were provided. Armband removed by staff, pt discharged from ED. Ambulated out to lobby  

## 2022-07-19 NOTE — ED Provider Notes (Signed)
[facility] Provider Note  CSN: [id number] Arrival date & time: 07/18/22 1933  Chief Complaint(s) Back Pain  HPI [name] is a 27 y.o. male with a past medical history listed below who presents to the emergency department with left lower back pain for 1 week described as deep ache.  Worse in the mornings and with range of motion and palpation.  Patient states that pain improves after he is able to warm up his muscles.  States that he has tried stretching without help.  He is also tried massages which make it worse.  He denies any bladder/bowel incontinence.  No lower extremity weakness or loss of sensation.  No radiculopathy pain.  No fall or trauma.  No fevers or chills.   The history is provided by the patient.    Past Medical History Past Medical History:  Diagnosis Date   Asthma    Patient Active Problem List   Diagnosis Date Noted   Severe sepsis ([place]) 08/04/2021   Bronchopneumonia due to bacteria 08/04/2021   Tobacco use disorder 08/04/2021   Hyponatremia 08/04/2021   Obesity, Class III, BMI 40-49.9 (morbid obesity) ([place]) 08/02/2021   Acute respiratory failure with hypoxia ([place]) 08/01/2021   Asthma, chronic, unspecified asthma severity, with acute exacerbation    ADD (attention deficit disorder) 10/17/2011   Home Medication(s) Prior to Admission medications   Medication Sig Start Date End Date Taking? Authorizing Provider  cyclobenzaprine (FLEXERIL) 10 MG tablet Take 0.5-1 tablets (5-10 mg total) by mouth at bedtime for 10 days. 07/19/22 07/29/22 Yes Kamill Fulbright, Grayce Sessions, MD  acetaminophen (TYLENOL) 500 MG tablet Take 500 mg by mouth every 6 (six) hours as needed for mild pain.    [provider]  albuterol (VENTOLIN HFA) 108 (90 Base) MCG/ACT inhaler Inhale 2 puffs into the lungs every 6 (six) hours as needed for wheezing or shortness of breath. 08/04/21   Mercy Riding, MD  Phenyleph-Doxylamine-DM-APAP (NYQUIL  SEVERE COLD/FLU) 5-6.25-10-325 MG/15ML LIQD Take 15 mLs by mouth every 6 (six) hours as needed (cold symptoms).    [provider]  predniSONE (DELTASONE) 50 MG tablet Take 1 tablet (50 mg total) by mouth daily. 08/05/21   Mercy Riding, MD                                                                                                                                    Allergies Patient has no known allergies.  Review of Systems Review of Systems As noted in HPI  Physical Exam Vital Signs  I have reviewed the triage vital signs BP 118/74 (BP Location: Right Arm)   Pulse 84   Temp 98 F (36.7 C) (Oral)   Resp 20   Ht 5' 9"$  (1.753 m)   Wt (!) 136.8 kg   SpO2 99%   BMI 44.54 kg/m   Physical Exam Vitals reviewed.  Constitutional:  General: He is not in acute distress.    Appearance: He is well-developed. He is not diaphoretic.  HENT:     Head: Normocephalic and atraumatic.     Right Ear: External ear normal.     Left Ear: External ear normal.     Nose: Nose normal.     Mouth/Throat:     Mouth: Mucous membranes are moist.  Eyes:     General: No scleral icterus.    Conjunctiva/sclera: Conjunctivae normal.  Neck:     Trachea: Phonation normal.  Cardiovascular:     Rate and Rhythm: Normal rate and regular rhythm.  Pulmonary:     Effort: Pulmonary effort is normal. No respiratory distress.     Breath sounds: No stridor.  Abdominal:     General: There is no distension.  Musculoskeletal:        General: Normal range of motion.     Cervical back: Normal range of motion. No bony tenderness.     Thoracic back: No tenderness or bony tenderness.     Lumbar back: Tenderness present. No bony tenderness.       Back:  Neurological:     Mental Status: He is alert and oriented to person, place, and time.     Comments: Spine Exam: Strength: 5/5 throughout LE bilaterally  Sensation: Intact to light touch in proximal and distal LE bilaterally    Psychiatric:         Behavior: Behavior normal.     ED Results and Treatments Labs (all labs ordered are listed, but only abnormal results are displayed) Labs Reviewed  URINALYSIS, ROUTINE W REFLEX MICROSCOPIC - Abnormal; Notable for the following components:      Result Value   Specific Gravity, Urine 1.032 (*)    Ketones, ur TRACE (*)    Protein, ur 30 (*)    All other components within normal limits                                                                                                                         EKG  EKG Interpretation  Date/Time:    Ventricular Rate:    PR Interval:    QRS Duration:   QT Interval:    QTC Calculation:   R Axis:     Text Interpretation:         Radiology No results found.  Medications Ordered in ED Medications - No data to display  Procedures Procedures  (including critical care time)  Medical Decision Making / ED Course   Medical Decision Making Amount and/or Complexity of Data Reviewed Labs: ordered. Decision-making details documented in ED Course.  Risk Prescription drug management.    27 y.o. male presents with back pain in lumbar area for 1 week without signs of radicular pain. No acute traumatic onset. No red flag symptoms of fever, weight loss, saddle anesthesia, weakness, fecal/urinary incontinence or urinary retention.   UA to rule out renal colic or infection negative.  Suspect MSK etiology.  No indication for imaging emergently.  Patient was recommended to take short course of scheduled NSAIDs and engage in early mobility as definitive treatment. Return precautions discussed for worsening or new concerning symptoms.        Final Clinical Impression(s) / ED Diagnoses Final diagnoses:  Strain of lumbar region, initial encounter   The patient appears reasonably screened and/or stabilized for  discharge and I doubt any other medical condition or other [facility] requiring further screening, evaluation, or treatment in the ED at this time. I have discussed the findings, Dx and Tx plan with the patient/family who expressed understanding and agree(s) with the plan. Discharge instructions discussed at length. The patient/family was given strict return precautions who verbalized understanding of the instructions. No further questions at time of discharge.  Disposition: Discharge  Condition: Good  ED Discharge Orders          Ordered    cyclobenzaprine (FLEXERIL) 10 MG tablet  Daily at bedtime        07/19/22 0007             Follow Up: Bobbye Riggs, [address] [place] [address] [phone]  Call  to schedule an appointment for close follow up           This chart was dictated using voice recognition software.  Despite best efforts to proofread,  errors can occur which can change the documentation meaning.    Fatima Blank, MD 07/19/22 [phone]

## 2022-08-01 ENCOUNTER — Other Ambulatory Visit (HOSPITAL_BASED_OUTPATIENT_CLINIC_OR_DEPARTMENT_OTHER): Payer: Self-pay

## 2022-08-01 DIAGNOSIS — R0681 Apnea, not elsewhere classified: Secondary | ICD-10-CM

## 2022-08-01 DIAGNOSIS — R5383 Other fatigue: Secondary | ICD-10-CM

## 2022-08-01 DIAGNOSIS — G4710 Hypersomnia, unspecified: Secondary | ICD-10-CM

## 2022-08-01 DIAGNOSIS — R0683 Snoring: Secondary | ICD-10-CM

## 2022-09-23 ENCOUNTER — Encounter: Payer: Self-pay | Admitting: Family Medicine

## 2022-09-23 ENCOUNTER — Ambulatory Visit (INDEPENDENT_AMBULATORY_CARE_PROVIDER_SITE_OTHER): Payer: 59 | Admitting: Family Medicine

## 2022-09-23 VITALS — BP 118/86 | HR 92 | Temp 97.8°F | Ht 69.0 in | Wt 301.0 lb

## 2022-09-23 DIAGNOSIS — F988 Other specified behavioral and emotional disorders with onset usually occurring in childhood and adolescence: Secondary | ICD-10-CM

## 2022-09-23 DIAGNOSIS — L905 Scar conditions and fibrosis of skin: Secondary | ICD-10-CM

## 2022-09-23 DIAGNOSIS — Z23 Encounter for immunization: Secondary | ICD-10-CM

## 2022-09-23 DIAGNOSIS — R5383 Other fatigue: Secondary | ICD-10-CM | POA: Insufficient documentation

## 2022-09-23 DIAGNOSIS — R52 Pain, unspecified: Secondary | ICD-10-CM | POA: Diagnosis not present

## 2022-09-23 DIAGNOSIS — K921 Melena: Secondary | ICD-10-CM

## 2022-09-23 NOTE — Assessment & Plan Note (Signed)
Most likely due to internal hemorrhoids.  On exam no protruding external hemorrhoids noted.  No fissures noted. - CBC -refer to GI.

## 2022-09-23 NOTE — Progress Notes (Signed)
New Patient Office Visit  Subjective    Patient ID: [name], male    DOB: Oct 03, 1995  Age: 27 y.o. MRN: [id number]  CC:  Chief Complaint  Patient presents with   Establish Care    HPI [name] presents to establish care   Had a primary care provider at [facility].  Was getting treated for asthma.  Has a daily and rescue inhaler.  Uses his albuterol about once a week.    Left leg wound - still painful.  Mostly the left lateral shin is still painful to touch.  Says it was for sepsis and was cut for evaluation of necrotizing fasciitis.  Does not put topical treatments on it.    Hemorrhoids - if he has a BM he has blood in his bowel movements.  Has never had a colonsocopy or seen a GI.  Mild pain bfeore bleeding.  Has blood in bowel movement every few weeks.  Never dark black stools.    ADHD - diagnosed as a child.  Took focalin as a child.  No side effects.  Affects ability to function at home and at work.    Anxiety/depression - has moments where he feels hopeless and feelings anxious/skin crawling.  Has been feeling that way off and on for several years.  More severe recently.  Trying to start a business is adding to his stress.  Starting a Futures trader business.    Wants to discuss weight - has gained significant amount of weight recently.    May have sleep apnea - sleep study was ordered but he never got a appt scheduled.  Is worried aobut having the positive results.   Back pain - feels like he pulled his back a while back. Gets a shooting pain in lower back and left leg occasionally.  Not triggered by anything.  Issues resolves on its own. Marland Kitchen   PMH: asthma, adhd  PSH: Left leg debridement  FH: dad - lung; mom - ovarian.  PGF - prostate; PGM - emphysema.  Mom - diabetes.    Tobacco use: < 1 ppd.  Quit for one year and is now smoking again.   Alcohol use: none  Drug use: none  Marital status: engaged.  Has 1 child and 3 step children.  13,11,3,2.    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   Sexual hx: with partner.     Outpatient Encounter Medications as of 09/23/2022  Medication Sig   acetaminophen (TYLENOL) 500 MG tablet Take 500 mg by mouth every 6 (six) hours as needed for mild pain.   albuterol (VENTOLIN HFA) 108 (90 Base) MCG/ACT inhaler Inhale 2 puffs into the lungs every 6 (six) hours as needed for wheezing or shortness of breath.   Phenyleph-Doxylamine-DM-APAP (NYQUIL SEVERE COLD/FLU) 5-6.25-10-325 MG/15ML LIQD Take 15 mLs by mouth every 6 (six) hours as needed (cold symptoms). (Patient not taking: Reported on 09/23/2022)   predniSONE (DELTASONE) 50 MG tablet Take 1 tablet (50 mg total) by mouth daily. (Patient not taking: Reported on 09/23/2022)   No facility-administered encounter medications on file as of 09/23/2022.    Past Medical History:  Diagnosis Date   Asthma    Hyponatremia 08/04/2021    No past surgical history on file.  No family history on file.  Social History   Socioeconomic History   Marital status: Single    Spouse name: Not on file   Number of children: Not on file   Years of education: Not on file  Highest education level: Not on file  Occupational History   Not on file  Tobacco Use   Smoking status: Former    Packs/day: 2    Types: Cigarettes    Quit date: 08/20/2021    Years since quitting: 1.0   Smokeless tobacco: Former  Substance and Sexual Activity   Alcohol use: Never   Drug use: Never   Sexual activity: Not on file  Other Topics Concern   Not on file  Social History Narrative   Not on file   Social Determinants of Health   Financial Resource Strain: Not on file  Food Insecurity: Not on file  Transportation Needs: Not on file  Physical Activity: Not on file  Stress: Not on file  Social Connections: Not on file  Intimate Partner Violence: Not on file    Review of Systems  Musculoskeletal:  Positive for back pain.        Objective    BP 118/86   Pulse 92    Temp 97.8 F (36.6 C) (Oral)   Ht  (1.753 m)   Wt (!) 301 lb (136.5 kg)   SpO2 96%   BMI 44.45 kg/m   Physical Exam Gen: alert, oriented Heent: perrla, eomi Cv: rrr, no murmur Pulm: lctab. No wheeze or crackles Gi: obese pannus.  Nbs. Extremity: Has a vertical healing wound on the left shin just lateral to the tibia.  Eschar lying over top.  Area of redness surrounding it.  It is tender to palpation.  Consistent with his previous surgical debridement location.      Assessment & Plan:   Problem List Items Addressed This Visit       Other   ADD (attention deficit disorder)    Took Focalin as a child.  Will follow-up at future visit if patient desires to resume treatment for this.      Bloody stool - Primary    Most likely due to internal hemorrhoids.  On exam no protruding external hemorrhoids noted.  No fissures noted. - CBC -refer to GI.      Relevant Orders   CBC   Comp Met (CMET)   Class 3 severe obesity due to excess calories in adult   Relevant Orders   Lipid Profile   HgB A1c   Comp Met (CMET)   Fatigue    Several possible explanations including undiagnosed sleep apnea (had an order placed but never heard back from anybody regarding this), obesity, stress/depression, even anemia related to his lower GI bleeding. - CBC - Continue management of other issues such as depression and sleep apnea       Relevant Orders   CBC   Painful cutaneous scar    Has a healing wound with small overlying eschar but does not appear infected.  This has been present for several months after getting sepsis in [place] and having a surgical debridement. - Discuss topical treatments at next visit       Other Visit Diagnoses     Vaccine for diphtheria-tetanus-pertussis, combined       Relevant Orders   Tdap vaccine greater than or equal to 7yo IM (Completed)       Return in about 1 week (around 09/30/2022) for anxiety.   Sandre Kitty, MD

## 2022-09-23 NOTE — Patient Instructions (Signed)
It was nice to meet you today,   I will send in a referral to the gastroenterologist.  I will discuss your blood work with you next week.    Please schedule something with me next week.    Have a great day,   Dr. Constance Goltz

## 2022-09-23 NOTE — Assessment & Plan Note (Signed)
Has a healing wound with small overlying eschar but does not appear infected.  This has been present for several months after getting sepsis in [place] and having a surgical debridement. - Discuss topical treatments at next visit

## 2022-09-23 NOTE — Assessment & Plan Note (Signed)
Several possible explanations including undiagnosed sleep apnea (had an order placed but never heard back from anybody regarding this), obesity, stress/depression, even anemia related to his lower GI bleeding. - CBC - Continue management of other issues such as depression and sleep apnea

## 2022-09-23 NOTE — Assessment & Plan Note (Signed)
Took Focalin as a child.  Will follow-up at future visit if patient desires to resume treatment for this.

## 2022-09-24 LAB — CBC
Hematocrit: 43.5 % (ref 37.5–51.0)
Hemoglobin: 15 g/dL (ref 13.0–17.7)
MCH: 30.1 pg (ref 26.6–33.0)
MCHC: 34.5 g/dL (ref 31.5–35.7)
MCV: 87 fL (ref 79–97)
Platelets: 225 10*3/uL (ref 150–450)
RBC: 4.99 x10E6/uL (ref 4.14–5.80)
RDW: 11.8 % (ref 11.6–15.4)
WBC: 7.7 10*3/uL (ref 3.4–10.8)

## 2022-09-24 LAB — LIPID PANEL
Chol/HDL Ratio: 4 ratio (ref 0.0–5.0)
Cholesterol, Total: 157 mg/dL (ref 100–199)
HDL: 39 mg/dL — ABNORMAL LOW (ref >39)
LDL Chol Calc (NIH): 89 mg/dL (ref 0–99)
Triglycerides: 169 mg/dL — ABNORMAL HIGH (ref 0–149)
VLDL Cholesterol Cal: 29 mg/dL (ref 5–40)

## 2022-09-24 LAB — HEMOGLOBIN A1C
Est. average glucose Bld gHb Est-mCnc: 120 mg/dL
Hgb A1c MFr Bld: 5.8 % — ABNORMAL HIGH (ref 4.8–5.6)

## 2022-09-24 LAB — COMPREHENSIVE METABOLIC PANEL
ALT: 68 IU/L — ABNORMAL HIGH (ref 0–44)
AST: 25 IU/L (ref 0–40)
Albumin/Globulin Ratio: 2 (ref 1.2–2.2)
Albumin: 4.6 g/dL (ref 4.3–5.2)
Alkaline Phosphatase: 92 IU/L (ref 44–121)
BUN/Creatinine Ratio: 13 (ref 9–20)
BUN: 12 mg/dL (ref 6–20)
Bilirubin Total: 0.5 mg/dL (ref 0.0–1.2)
CO2: 21 mmol/L (ref 20–29)
Calcium: 9.1 mg/dL (ref 8.7–10.2)
Chloride: 100 mmol/L (ref 96–106)
Creatinine, Ser: 0.92 mg/dL (ref 0.76–1.27)
Globulin, Total: 2.3 g/dL (ref 1.5–4.5)
Glucose: 96 mg/dL (ref 70–99)
Potassium: 4.3 mmol/L (ref 3.5–5.2)
Sodium: 136 mmol/L (ref 134–144)
Total Protein: 6.9 g/dL (ref 6.0–8.5)
eGFR: 116 mL/min/{1.73_m2} (ref >59)

## 2022-09-30 ENCOUNTER — Ambulatory Visit (INDEPENDENT_AMBULATORY_CARE_PROVIDER_SITE_OTHER): Payer: 59 | Admitting: Family Medicine

## 2022-09-30 ENCOUNTER — Encounter: Payer: Self-pay | Admitting: Family Medicine

## 2022-09-30 VITALS — BP 108/76 | HR 88 | Temp 98.5°F | Ht 69.0 in | Wt 302.8 lb

## 2022-09-30 DIAGNOSIS — E8881 Metabolic syndrome: Secondary | ICD-10-CM

## 2022-09-30 DIAGNOSIS — R52 Pain, unspecified: Secondary | ICD-10-CM

## 2022-09-30 DIAGNOSIS — R7989 Other specified abnormal findings of blood chemistry: Secondary | ICD-10-CM | POA: Diagnosis not present

## 2022-09-30 DIAGNOSIS — J45901 Unspecified asthma with (acute) exacerbation: Secondary | ICD-10-CM

## 2022-09-30 DIAGNOSIS — R5383 Other fatigue: Secondary | ICD-10-CM

## 2022-09-30 DIAGNOSIS — L905 Scar conditions and fibrosis of skin: Secondary | ICD-10-CM

## 2022-09-30 MED ORDER — LIDOCAINE-PRILOCAINE 2.5-2.5 % EX CREA: 1.0000 | TOPICAL_CREAM | CUTANEOUS | 2 refills | Status: DC | PRN

## 2022-09-30 NOTE — Patient Instructions (Signed)
We do the following things today: - I ordered lidocaine cream for you to put on the wound as needed to help with pain. - We put in a referral to weight management clinic.  If you have not heard from them in 1 week please let us know - I have ordered some lab tests and imaging for your elevated liver enzymes.  Please follow-up in 1 month Have a great day,  Frederic Jericho, MD

## 2022-09-30 NOTE — Progress Notes (Unsigned)
   Established Patient Office Visit  Subjective   Patient ID: Aurther Harlin, male    DOB: 03/06/1996  Age: 27 y.o. MRN: [id number]  No chief complaint on file.   Prediabetes  Cholesterol  Liver tests - hep screening, iron studies - transferrin sat and ferritin  Fatigue  Asthma - more tired without it.  Wont get the tightness feeling.    Skin - recently reopened it and had some pus.    Weight and well neess.    Lidocaine cream.      {History (Optional):23778}  ROS    Objective:     There were no vitals taken for this visit. {Vitals History (Optional):23777}  Physical Exam   No results found for any visits on 09/30/22.  {Labs (Optional):23779}  The ASCVD Risk score (Arnett DK, et al., 2019) failed to calculate for the following reasons:   The 2019 ASCVD risk score is only valid for ages 69 to 37    Assessment & Plan:   Problem List Items Addressed This Visit   None   No follow-ups on file.    Sandre Kitty, MD

## 2022-10-01 LAB — IRON,TIBC AND FERRITIN PANEL
Ferritin: 83 ng/mL (ref 30–400)
Iron Saturation: 15 % (ref 15–55)
Iron: 53 ug/dL (ref 38–169)
Total Iron Binding Capacity: 364 ug/dL (ref 250–450)
UIBC: 311 ug/dL (ref 111–343)

## 2022-10-01 LAB — HEPATITIS C ANTIBODY: Hep C Virus Ab: NONREACTIVE

## 2022-10-01 LAB — HEPATITIS B SURFACE ANTIBODY,QUALITATIVE: Hep B Surface Ab, Qual: REACTIVE

## 2022-10-01 LAB — HEPATITIS B CORE ANTIBODY, IGM: Hep B C IgM: NEGATIVE

## 2022-10-01 LAB — HEPATITIS B SURFACE ANTIGEN: Hepatitis B Surface Ag: NEGATIVE

## 2022-10-03 ENCOUNTER — Telehealth: Payer: Self-pay

## 2022-10-03 DIAGNOSIS — R7989 Other specified abnormal findings of blood chemistry: Secondary | ICD-10-CM | POA: Insufficient documentation

## 2022-10-03 DIAGNOSIS — E8881 Metabolic syndrome: Secondary | ICD-10-CM | POA: Insufficient documentation

## 2022-10-03 NOTE — Telephone Encounter (Signed)
-----   Message from Sandre Kitty, MD sent at 10/03/2022  4:01 PM EDT ----- I think I forgot copy you on his results.  Please call and let him know his results were normal.

## 2022-10-03 NOTE — Assessment & Plan Note (Signed)
No sign of infection on exam. - Prescribing Emla cream

## 2022-10-03 NOTE — Telephone Encounter (Signed)
Called pt he is advised of his lab results 

## 2022-10-03 NOTE — Assessment & Plan Note (Signed)
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  No significant alcohol use. - Continue weight loss counseling and referral to healthy weight and wellness - Continue workup to rule out other causes including further blood tests and liver elastography

## 2022-10-03 NOTE — Assessment & Plan Note (Signed)
Improved with use of his inhaler - Continue daily inhaler with albuterol as needed

## 2022-10-03 NOTE — Assessment & Plan Note (Signed)
Likely due to weight/diet and asthma.  Improving somewhat with asthma medication.  Working on losing weight as well.

## 2022-10-03 NOTE — Assessment & Plan Note (Signed)
Increased waist circumference, decreased LDL, elevated triglyceride, prediabetes, elevated LFTs.  Meets criteria for metabolic syndrome. - Referring to healthy weight and wellness - Continue to encourage and Counsel on weight loss.

## 2022-10-05 ENCOUNTER — Encounter (INDEPENDENT_AMBULATORY_CARE_PROVIDER_SITE_OTHER): Payer: 59 | Admitting: Family Medicine

## 2022-10-10 ENCOUNTER — Ambulatory Visit (INDEPENDENT_AMBULATORY_CARE_PROVIDER_SITE_OTHER): Payer: 59 | Admitting: Nurse Practitioner

## 2022-10-10 ENCOUNTER — Encounter: Payer: Self-pay | Admitting: Nurse Practitioner

## 2022-10-10 VITALS — BP 134/93 | HR 90 | Temp 98.2°F | Ht 69.0 in | Wt 296.0 lb

## 2022-10-10 DIAGNOSIS — R7989 Other specified abnormal findings of blood chemistry: Secondary | ICD-10-CM

## 2022-10-10 DIAGNOSIS — Z6841 Body Mass Index (BMI) 40.0 and over, adult: Secondary | ICD-10-CM | POA: Diagnosis not present

## 2022-10-10 DIAGNOSIS — R7303 Prediabetes: Secondary | ICD-10-CM | POA: Diagnosis not present

## 2022-10-10 DIAGNOSIS — Z0289 Encounter for other administrative examinations: Secondary | ICD-10-CM

## 2022-10-10 NOTE — Progress Notes (Signed)
Office: [phone]  /  Fax: [phone]   Initial Visit  [name] was seen in clinic today to evaluate for obesity. He is interested in losing weight to improve overall health and reduce the risk of weight related complications. He presents today to review program treatment options, initial physical assessment, and evaluation.     He was referred by: PCP  When asked what else they would like to accomplish? He states: Lose a target amount of weight : Goal weight 200 lbs  Weight history:  He started gaining weight when he was a chil. 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  He struggles with hunger and cravings.    When asked how has your weight affected you? He states: Contributed to orthopedic problems or mobility issues, Having fatigue, and Having poor endurance  Some associated conditions: asthma, add, fatigue, elevated LFTs, metabolic syndrome,   Contributing factors: Family history, Stress, Reduced physical activity, and Life event  Weight promoting medications identified: None  Current nutrition plan: Recently started keto  Current level of physical activity: He works at a physical job  Current or previous pharmacotherapy: None  Response to medication: Never tried medications   Past medical history includes:   Past Medical History:  Diagnosis Date   Asthma    Hyponatremia 08/04/2021     Objective:   BP (!) 134/93   Pulse 90   Temp 98.2 F (36.8 C)   Ht 5\' 9"  (1.753 m)   Wt 296 lb (134.3 kg)   SpO2 96%   BMI 43.71 kg/m  He was weighed on the bioimpedance scale: Body mass index is 43.71 kg/m.  Peak Weight:330 lbs , Body Fat%:36.2, Visceral Fat Rating:19, Weight trend over the last 12 months: Increasing  General:  Alert, oriented and cooperative. Patient is in no acute distress.  Respiratory: Normal respiratory effort, no problems with respiration noted   Gait: able to  ambulate independently  Mental Status: Normal mood and affect. Normal behavior. Normal judgment and thought content.   DIAGNOSTIC DATA REVIEWED:  BMET    Component Value Date/Time   NA 136 09/23/2022 1044   K 4.3 09/23/2022 1044   CL 100 09/23/2022 1044   CO2 21 09/23/2022 1044   GLUCOSE 96 09/23/2022 1044   GLUCOSE 125 (H) 08/04/2021 0547   BUN 12 09/23/2022 1044   CREATININE 0.92 09/23/2022 1044   CALCIUM 9.1 09/23/2022 1044   GFRNONAA >60 08/04/2021 0547   Lab Results  Component Value Date   HGBA1C 5.8 (H) 09/23/2022   HGBA1C 5.3 08/02/2021   No results found for: "INSULIN" CBC    Component Value Date/Time   WBC 7.7 09/23/2022 1044   WBC 17.0 (H) 08/04/2021 0547   RBC 4.99 09/23/2022 1044   RBC 4.83 08/04/2021 0547   HGB 15.0 09/23/2022 1044   HCT 43.5 09/23/2022 1044   PLT 225 09/23/2022 1044   MCV 87 09/23/2022 1044   MCH 30.1 09/23/2022 1044   MCH 31.3 08/04/2021 0547   MCHC 34.5 09/23/2022 1044   MCHC 33.4 08/04/2021 0547   RDW 11.8 09/23/2022 1044   Iron/TIBC/Ferritin/ %Sat    Component Value Date/Time   IRON 53 09/30/2022 0946   TIBC 364 09/30/2022 0946   FERRITIN 83 09/30/2022 0946   IRONPCTSAT 15 09/30/2022 0946   Lipid Panel     Component Value Date/Time   CHOL 157 09/23/2022 1044   TRIG 169 (  H) 09/23/2022 1044   HDL 39 (L) 09/23/2022 1044   CHOLHDL 4.0 09/23/2022 1044   CHOLHDL 9.4 08/03/2021 0518   VLDL 32 08/03/2021 0518   LDLCALC 89 09/23/2022 1044   Hepatic Function Panel     Component Value Date/Time   PROT 6.9 09/23/2022 1044   ALBUMIN 4.6 09/23/2022 1044   AST 25 09/23/2022 1044   ALT 68 (H) 09/23/2022 1044   ALKPHOS 92 09/23/2022 1044   BILITOT 0.5 09/23/2022 1044      Component Value Date/Time   TSH 0.498 08/02/2021 1048     Assessment and Plan:   Pre-diabetes [name] wants to work on weight loss, exercise, and decreasing simple carbohydrates to help decrease the risk of diabetes.    Elevated LFTs Keep appt for  [place].  Continue to follow up with PCP.    Morbid obesity (HCC)  BMI 40.0-44.9, adult (HCC)        Obesity Treatment / Action Plan:  Patient will work on garnering support from family and friends to begin weight loss journey. Will work on eliminating or reducing the presence of highly palatable, calorie dense foods in the home. Will complete provided nutritional and psychosocial assessment questionnaire before the next appointment. Will be scheduled for indirect calorimetry to determine resting energy expenditure in a fasting state.  This will allow [place] to create a reduced calorie, high-protein meal plan to promote loss of fat mass while preserving muscle mass.  Obesity Education Performed Today:  He was weighed on the bioimpedance scale and results were discussed and documented in the synopsis.  We discussed obesity as a disease and the importance of a more detailed evaluation of all the factors contributing to the disease.  We discussed the importance of long term lifestyle changes which include nutrition, exercise and behavioral modifications as well as the importance of customizing this to his specific health and social needs.  We discussed the benefits of reaching a healthier weight to alleviate the symptoms of existing conditions and reduce the risks of the biomechanical, metabolic and psychological effects of obesity.  [name] appears to be in the action stage of change and states they are ready to start intensive lifestyle modifications and behavioral modifications.  30 minutes was spent today on this visit including the above counseling, pre-visit chart review, and post-visit documentation.  Reviewed by clinician on day of visit: allergies, medications, problem list, medical history, surgical history, family history, social history, and previous encounter notes pertinent to obesity diagnosis.    Theodis Sato Burton Gahan FNP-C

## 2022-10-17 ENCOUNTER — Other Ambulatory Visit: Payer: Self-pay

## 2022-10-17 ENCOUNTER — Other Ambulatory Visit: Payer: Self-pay | Admitting: Family Medicine

## 2022-10-17 MED ORDER — FLUTICASONE FUROATE-VILANTEROL 200-25 MCG/ACT IN AEPB
2.0000 | INHALATION_SPRAY | Freq: Every day | RESPIRATORY_TRACT | 0 refills | Status: DC
  Filled 2022-10-17 – 2022-10-18 (×2): qty 60, 30d supply, fill #0

## 2022-10-18 ENCOUNTER — Other Ambulatory Visit: Payer: Self-pay

## 2022-10-18 ENCOUNTER — Other Ambulatory Visit (HOSPITAL_COMMUNITY): Payer: Self-pay

## 2022-10-18 ENCOUNTER — Encounter: Payer: Self-pay | Admitting: Pharmacist

## 2022-10-21 ENCOUNTER — Other Ambulatory Visit (HOSPITAL_COMMUNITY): Payer: Self-pay

## 2022-10-26 ENCOUNTER — Encounter: Payer: Self-pay | Admitting: Family Medicine

## 2022-10-26 DIAGNOSIS — J45901 Unspecified asthma with (acute) exacerbation: Secondary | ICD-10-CM

## 2022-10-27 ENCOUNTER — Other Ambulatory Visit: Payer: Self-pay | Admitting: Family Medicine

## 2022-10-27 ENCOUNTER — Other Ambulatory Visit (HOSPITAL_COMMUNITY): Payer: Self-pay

## 2022-10-27 ENCOUNTER — Ambulatory Visit
Admission: RE | Admit: 2022-10-27 | Discharge: 2022-10-27 | Disposition: A | Discharge status: Home / Self Care | Payer: 59 | Source: Ambulatory Visit | Attending: Family Medicine | Admitting: Family Medicine

## 2022-10-27 DIAGNOSIS — R7989 Other specified abnormal findings of blood chemistry: Secondary | ICD-10-CM

## 2022-10-27 DIAGNOSIS — J45901 Unspecified asthma with (acute) exacerbation: Secondary | ICD-10-CM

## 2022-10-27 MED ORDER — FLUTICASONE FUROATE-VILANTEROL 200-25 MCG/ACT IN AEPB: 2.0000 | INHALATION_SPRAY | Freq: Every day | RESPIRATORY_TRACT | 0 refills | Status: DC

## 2022-10-27 MED ORDER — BUDESONIDE-FORMOTEROL FUMARATE 80-4.5 MCG/ACT IN AERO
2.0000 | INHALATION_SPRAY | Freq: Two times a day (BID) | RESPIRATORY_TRACT | 3 refills | Status: DC
Start: 2022-10-27 — End: 2022-12-29

## 2022-10-27 NOTE — Addendum Note (Signed)
Addended by: Tonny Bollman on: 10/27/2022 09:10 AM   Modules accepted: Orders

## 2022-10-30 ENCOUNTER — Encounter: Payer: Self-pay | Admitting: Family Medicine

## 2022-11-01 ENCOUNTER — Ambulatory Visit: Payer: 59 | Admitting: Bariatrics

## 2022-11-04 ENCOUNTER — Ambulatory Visit: Payer: 59 | Admitting: Family Medicine

## 2022-11-16 ENCOUNTER — Ambulatory Visit: Payer: 59 | Admitting: Nurse Practitioner

## 2022-11-25 ENCOUNTER — Ambulatory Visit: Payer: 59 | Admitting: Family Medicine

## 2022-12-05 ENCOUNTER — Encounter: Payer: Self-pay | Admitting: Bariatrics

## 2022-12-05 ENCOUNTER — Ambulatory Visit: Payer: 59 | Admitting: Bariatrics

## 2022-12-05 VITALS — BP 143/82 | HR 86 | Temp 98.0°F | Ht 69.0 in | Wt 301.0 lb

## 2022-12-05 DIAGNOSIS — R7989 Other specified abnormal findings of blood chemistry: Secondary | ICD-10-CM | POA: Diagnosis not present

## 2022-12-05 DIAGNOSIS — E559 Vitamin D deficiency, unspecified: Secondary | ICD-10-CM

## 2022-12-05 DIAGNOSIS — R5383 Other fatigue: Secondary | ICD-10-CM | POA: Diagnosis not present

## 2022-12-05 DIAGNOSIS — Z6841 Body Mass Index (BMI) 40.0 and over, adult: Secondary | ICD-10-CM

## 2022-12-05 DIAGNOSIS — Z0000 Encounter for general adult medical examination without abnormal findings: Secondary | ICD-10-CM

## 2022-12-05 DIAGNOSIS — R0602 Shortness of breath: Secondary | ICD-10-CM | POA: Diagnosis not present

## 2022-12-05 DIAGNOSIS — Z1331 Encounter for screening for depression: Secondary | ICD-10-CM | POA: Diagnosis not present

## 2022-12-05 DIAGNOSIS — R7303 Prediabetes: Secondary | ICD-10-CM | POA: Diagnosis not present

## 2022-12-05 DIAGNOSIS — E538 Deficiency of other specified B group vitamins: Secondary | ICD-10-CM | POA: Diagnosis not present

## 2022-12-06 ENCOUNTER — Encounter: Payer: Self-pay | Admitting: Bariatrics

## 2022-12-06 ENCOUNTER — Encounter (INDEPENDENT_AMBULATORY_CARE_PROVIDER_SITE_OTHER): Payer: Self-pay | Admitting: Bariatrics

## 2022-12-06 DIAGNOSIS — E88819 Insulin resistance, unspecified: Secondary | ICD-10-CM | POA: Insufficient documentation

## 2022-12-06 LAB — COMPREHENSIVE METABOLIC PANEL
ALT: 60 IU/L — ABNORMAL HIGH (ref 0–44)
AST: 32 IU/L (ref 0–40)
Albumin: 4.5 g/dL (ref 4.3–5.2)
Alkaline Phosphatase: 92 IU/L (ref 44–121)
BUN/Creatinine Ratio: 18 (ref 9–20)
BUN: 17 mg/dL (ref 6–20)
Bilirubin Total: 0.3 mg/dL (ref 0.0–1.2)
CO2: 21 mmol/L (ref 20–29)
Calcium: 8.8 mg/dL (ref 8.7–10.2)
Chloride: 101 mmol/L (ref 96–106)
Creatinine, Ser: 0.96 mg/dL (ref 0.76–1.27)
Globulin, Total: 2.5 g/dL (ref 1.5–4.5)
Glucose: 103 mg/dL — ABNORMAL HIGH (ref 70–99)
Potassium: 4.5 mmol/L (ref 3.5–5.2)
Sodium: 137 mmol/L (ref 134–144)
Total Protein: 7 g/dL (ref 6.0–8.5)
eGFR: 110 mL/min/{1.73_m2} (ref >59)

## 2022-12-06 LAB — VITAMIN D 25 HYDROXY (VIT D DEFICIENCY, FRACTURES): Vit D, 25-Hydroxy: 24.8 ng/mL — ABNORMAL LOW (ref 30.0–100.0)

## 2022-12-06 LAB — VITAMIN B12: Vitamin B-12: 337 pg/mL (ref 232–1245)

## 2022-12-06 LAB — TSH: TSH: 2.52 u[IU]/mL (ref 0.450–4.500)

## 2022-12-06 LAB — INSULIN, RANDOM: INSULIN: 16.3 u[IU]/mL (ref 2.6–24.9)

## 2022-12-06 NOTE — Progress Notes (Signed)
Chief Complaint:   OBESITY [name] (MR# [id number]) is a 27 y.o. male who presents for evaluation and treatment of obesity and related comorbidities. Current BMI is Body mass index is 44.45 kg/m. Nohe has been struggling with his weight for many years and has been unsuccessful in either losing weight, maintaining weight loss, or reaching his healthy weight goal.  Bavly is currently in the action stage of change and ready to dedicate time achieving and maintaining a healthier weight. Vamsi is interested in becoming our patient and working on intensive lifestyle modifications including (but not limited to) diet and exercise for weight loss.  Patient met with Judeth Cornfield, nurse practitioner on 10/10/2022.  Brewster's habits were reviewed today and are as follows: His family eats meals together, he thinks his family will eat healthier with him, his desired weight loss is 101 lbs, he has been heavy most of his life, his heaviest weight ever was 320 pounds, and he struggles with emotional eating.  Depression Screen Ewart's Food and Mood (modified PHQ-9) score was 18.  Subjective:   1. Other fatigue Pledger admits to daytime somnolence and admits to waking up still tired. Patient has a history of symptoms of daytime fatigue, morning fatigue, and morning headache. Akeen generally gets 6 or 8 hours of sleep per night, and states that he has generally restful sleep. Snoring is present. Apneic episodes are not present. Epworth Sleepiness Score is 8.   2. SOB (shortness of breath) on exertion Ardian notes increasing shortness of breath with exercising and seems to be worsening over time with weight gain. He notes getting out of breath sooner with activity than he used to. This has not gotten worse recently. Nycholas denies shortness of breath at rest or orthopnea.  3. Pre-diabetes Patient's last A1c was 5.8.  He is not on medications.  4. Elevated LFTs Patient's last ALT was 68 in April  2024, (fatty liver).  5. Health care maintenance Given obesity.  6. Vitamin D deficiency Patient is not on vitamin supplementation.  7. B12 deficiency Patient is not on vitamin supplementation.  Assessment/Plan:   1. Other fatigue Shenouda does feel that his weight is causing his energy to be lower than it should be. Fatigue may be related to obesity, depression or many other causes. Labs will be ordered, and in the meanwhile, Royse will focus on self care including making healthy food choices, increasing physical activity and focusing on stress reduction.  - EKG 12-Lead - TSH  2. SOB (shortness of breath) on exertion Fabrice does feel that he gets out of breath more easily that he used to when he exercises. Waymon's shortness of breath appears to be obesity related and exercise induced. He has agreed to work on weight loss and gradually increase exercise to treat his exercise induced shortness of breath. Will continue to monitor closely.  - TSH  3. Pre-diabetes We will check labs today, and we will follow-up at patient's next appointment.  - Insulin, random  4. Elevated LFTs We will check labs today, and we will follow-up at patient's next appointment.  - Comprehensive metabolic panel  5. Health care maintenance We will check labs today. EKG and IC were done today, and reviewed with the patient.   - Insulin, random - VITAMIN D 25 Hydroxy (Vit-D Deficiency, Fractures) - TSH - Comprehensive metabolic panel  6. Vitamin D deficiency We will check labs today, and we will follow-up at patient's next appointment.  - VITAMIN D 25 Hydroxy (  Vit-D Deficiency, Fractures)  7. B12 deficiency We will check labs today, and we will follow-up at patient's next appointment.  - Vitamin B12  8. Depression screening Trentyn had a positive depression screening. Depression is commonly associated with obesity and often results in emotional eating behaviors. We will monitor this closely and  work on CBT to help improve the non-hunger eating patterns. Referral to Psychology may be required if no improvement is seen as he continues in our clinic.  9. Morbid obesity (HCC) - Insulin, random - VITAMIN D 25 Hydroxy (Vit-D Deficiency, Fractures) - TSH - Comprehensive metabolic panel  10. BMI 40.0-44.9, adult [facility]) Kelynn is currently in the action stage of change and his goal is to continue with weight loss efforts. I recommend Keita begin the structured treatment plan as follows:  He has agreed to the Category 4 Plan.  Meal planning was discussed.  Review labs with the patient from 09/23/2022, CMP, lipids, CBC, A1c, and glucose.  Dining out guide was given.  Exercise goals: No exercise has been prescribed at this time.   Behavioral modification strategies: increasing lean protein intake, decreasing simple carbohydrates, increasing vegetables, increasing water intake, decreasing eating out, no skipping meals, meal planning and cooking strategies, keeping healthy foods in the home, and planning for success.  He was informed of the importance of frequent follow-up visits to maximize his success with intensive lifestyle modifications for his multiple health conditions. He was informed we would discuss his lab results at his next visit unless there is a critical issue that needs to be addressed sooner. Rafi agreed to keep his next visit at the agreed upon time to discuss these results.  Objective:   Blood pressure (!) 143/82, pulse 86, temperature 98 F (36.7 C), height 5\' 9"  (1.753 m), weight (!) 301 lb (136.5 kg), SpO2 98 %. Body mass index is 44.45 kg/m.  EKG: Normal sinus rhythm, rate 78 BPM.  Indirect Calorimeter completed today shows a VO2 of 353 and a REE of 2434.  His calculated basal metabolic rate is 1610 thus his basal metabolic rate is worse than expected.  General: Cooperative, alert, well developed, in no acute distress. HEENT: Conjunctivae and lids  unremarkable. Cardiovascular: Regular rhythm.  Lungs: Normal work of breathing. Neurologic: No focal deficits.   Lab Results  Component Value Date   CREATININE 0.96 12/05/2022   BUN 17 12/05/2022   NA 137 12/05/2022   K 4.5 12/05/2022   CL 101 12/05/2022   CO2 21 12/05/2022   Lab Results  Component Value Date   ALT 60 (H) 12/05/2022   AST 32 12/05/2022   ALKPHOS 92 12/05/2022   BILITOT 0.3 12/05/2022   Lab Results  Component Value Date   HGBA1C 5.8 (H) 09/23/2022   HGBA1C 5.3 08/02/2021   Lab Results  Component Value Date   INSULIN 16.3 12/05/2022   Lab Results  Component Value Date   TSH 2.520 12/05/2022   Lab Results  Component Value Date   CHOL 157 09/23/2022   HDL 39 (L) 09/23/2022   LDLCALC 89 09/23/2022   TRIG 169 (H) 09/23/2022   CHOLHDL 4.0 09/23/2022   Lab Results  Component Value Date   WBC 7.7 09/23/2022   HGB 15.0 09/23/2022   HCT 43.5 09/23/2022   MCV 87 09/23/2022   PLT 225 09/23/2022   Lab Results  Component Value Date   IRON 53 09/30/2022   TIBC 364 09/30/2022   FERRITIN 83 09/30/2022   Attestation Statements:   Reviewed  by clinician on day of visit: allergies, medications, problem list, medical history, surgical history, family history, social history, and previous encounter notes.   Trude Mcburney, am acting as Energy manager for [employer], DO.  I have reviewed the above documentation for accuracy and completeness, and I agree with the above. Corinna Capra, DO

## 2022-12-19 ENCOUNTER — Telehealth: Payer: Self-pay

## 2022-12-19 ENCOUNTER — Encounter: Payer: Self-pay | Admitting: Nurse Practitioner

## 2022-12-19 ENCOUNTER — Ambulatory Visit: Payer: 59 | Admitting: Nurse Practitioner

## 2022-12-19 VITALS — BP 132/87 | HR 85 | Temp 97.8°F | Ht 69.0 in | Wt 296.0 lb

## 2022-12-19 DIAGNOSIS — Z6841 Body Mass Index (BMI) 40.0 and over, adult: Secondary | ICD-10-CM | POA: Diagnosis not present

## 2022-12-19 DIAGNOSIS — E559 Vitamin D deficiency, unspecified: Secondary | ICD-10-CM | POA: Diagnosis not present

## 2022-12-19 DIAGNOSIS — K760 Fatty (change of) liver, not elsewhere classified: Secondary | ICD-10-CM | POA: Diagnosis not present

## 2022-12-19 MED ORDER — WEGOVY 0.25 MG/0.5ML ~~LOC~~ SOAJ
0.2500 mg | SUBCUTANEOUS | 0 refills | Status: DC
Start: 2022-12-19 — End: 2023-02-07

## 2022-12-19 MED ORDER — VITAMIN D (ERGOCALCIFEROL) 1.25 MG (50000 UNIT) PO CAPS
50000.0000 [IU] | ORAL_CAPSULE | ORAL | 0 refills | Status: DC
Start: 2022-12-19 — End: 2023-01-09

## 2022-12-19 NOTE — Telephone Encounter (Signed)
PA submitted through Cover My Meds for [facility]. Awaiting insurance determination. Key: [id number]

## 2022-12-19 NOTE — Patient Instructions (Addendum)
What is a GLP-1 Glucagon like peptide-1 (GLP-1) agonists represent a class of medications used to treat type 2 diabetes mellitus and obesity.  GLP-1 medications mimic the action of a hormone called glucagon like peptide 1.  When blood sugar levels start to rise/increase these drugs stimulate the body to produce more insulin.  When that happens, the extra insulin helps to lower the blood sugar levels in the body.  This in returns helps with decreasing cravings.  These medications also slow the movement of food from the stomach into the small intestine.  This in return helps one to full faster and longer.   Diabetic medications: Approved for treatment of diabetes mellitus but does not have full approval for weight loss use Steps to starting your [facility]  The office staff will send a prior authorization request to your insurance company for approval. We will send you a mychart message once we hear back from your insurance with a decision.  This can take up to 7-10 business days.   Once your WegovyTis approved, you may then pick up [facility] pen from your pharmacy.    Learn how to do Wegovy injections on the [email or web address] website. There is a training video that will walk you through how to safely perform the injection. If you have questions for our clinical staff, please contact our  clinical staff. If you have any symptoms of allergic reaction to [facility] discontinue immediately and call 911.  1. What should I tell my provider before using WegovyT ? have or have had problems with your pancreas or kidneys. have type 2 diabetes and a history of diabetic retinopathy. have or have had depression, suicidal thoughts, or mental health issues. are pregnant or plan to become pregnant. [name] may harm your unborn baby. You should stop using WegovyT 3 months before you plan to become pregnant or if you are breastfeeding or plan to breastfeed. It is not known if WegovyT passes into your breast milk.  2. What is  [name] and how does it work?  [name] is an injectable prescription medication prescribed by your provider to help with your weight loss.  This medicine will be most effective when combined with a reduced calorie diet and physical activity.  [name] is not for the treatment of type 2 diabetes mellitus. [name] should not be used with other GLP-1 receptor agonist medicines. The addition of WegovyT in  patients treated with insulin has not been evaluated. When initiating WegovyT, consider reducing the dose of concomitantly administered insulin secretagogues (such as sulfonylureas) or insulin to reduce the risk of  hypoglycemia.  One role of GLP-1 is to send a signal to your brain to tell it you are full. It also slows down stomach emptying which will make you feel full longer and may help with reducing cravings.   3.  How should I take WegovyT?  Administer WegovyT once weekly, on the same day each week, at any time of day, with or without meals Inject subcutaneously in the abdomen, thigh or upper arm Initiate at 0.25 mg once weekly for 4 weeks. In 4 week intervals, increase the dose until a dose of 2.4 mg is reached (we will discuss with you the dosage at each visit). The maintenance dose of WegovyT is 2.4 mg once weekly.  The dosing schedule of Wegovy is:  0.25 mg per week X 4 weeks 0.5 mg per week X 4 weeks 1.0 mg per week X 4 weeks 1.7 mg per week X 4 weeks 2.4 mg  per week   Missed dose   If you miss your injection day, go ahead inject your current dose. You can go >7 days, but not <7 days between injections. You may change your injection day (It must be >7 days). If you miss >2 doses, you can still keep next injection dose the same or follow de-escalation schedule which may minimize GI symptoms.   In patients with type 2 diabetes, monitor blood glucose prior to starting and during WEGOVYT treatment.   Inject your dose of Wegovy under the skin (subcutaneous injection) in your stomach  area (abdomen), upper leg (thigh) or upper arm. Do not inject into a vein or a muscle. The injection site should be rotated and not given in the same spot each day. Hold the needle under the skin and count to "10". This will allow all of the medicine to be dispensed under the skin. Always wipe your skin with an alcohol prep pad before injection  Dispose of used pen in an approved sharps container. More practical options that can be put in the trash  to go to the landfill are milk jugs or plastic laundry detergent containers with a screw on lid.  What side effects may I notice from taking WegovyT?  Side effects that usually do not require medical attention (report to our office if they continue or are bothersome): Nausea (most common but decreases over time in most people as their body gets used to the medicine) Diarrhea Constipation (you may take an over the counter laxative if needed) Headache Decreased appetite Upset stomach Tiredness Dizziness Feeling bloated Hair loss Belching Gas Heartburn  Side effects that you should call 911 as soon as possible Vomiting Stomach pain Fever Yellowing of your skin or eyes  Clay-colored stools Increased heart rate while at rest Low blood sugar  Sudden changes in mood, behaviors, thoughts, feelings, or thoughts of suicide If you get a lump or swelling in your neck, hoarseness, trouble swallowing, or shortness of breath. Allergic reaction such as skin rash, itching, hives, swelling of the face, tongue, or lips  Helpful tips for managing nausea Nausea is a common side effect when first starting WegovyT. If you experience nausea, be sure to connect with your health care provider. He or she will offer guidance on ways to manage it, which may include: Eat bland, low-fat foods, like crackers, toast and rice  Eat foods that contain water, like soups and gelatin  Avoid lying down after you eat  Go outdoors for fresh air  Eat more slowly     Other important information Do not drop your pen or knock it against hard surfaces  Do not expose your pen to any liquids  If you think that your pen may be damaged, do not try to fix it. Use a new one Keep the pen cap on until you are ready to inject. Your pen will no longer be sterile if you store an unused pen without the cap, if you pull the pen cap off and put it on again, or if the pen cap is missing. This could lead to an infection  Store the [place] pen in the refrigerator from 35F to 45F (2C to 8C) If needed, before removing the pen cap, WegovyT can be stored from 8C to 30C (45F to 34F) in the original carton for up to 28 days.  Keep WegovyT in the original carton to protect it from light  Do not freeze  Throw away pen if [name] has been  frozen, has been exposed to light or temperatures above 62F (30C), or has been out of the refrigerator for 28 days or longer It's important to properly dispose of your used WegovyT pens. Do not throw the pen away in your household trash. Instead, use an FDA-cleared sharps disposable container or a sturdy household container with a tight-fitting lid, like a heavy duty plastic container.   JJOACZ pen training website: NastyThought.uy  Wegovy savings and support link: achegone.com   e) Ozempic (semaglutide) Mounjaro Trulicity Rybelsus  Weight loss medications: Approved for long-term weight loss use.        Saxenda (liraglutide) Wegovy (semaglutide) Zepbound Contraindications:  Pancreatitis (active gallstones) Medullary thyroid cancer High triglycerides (>500)-will need labs prior to starting Multiple Endocrine Neoplasia syndrome type 2 (MEN 2) Trying to get pregnant Breastfeeding Use with caution with taking insulin or sulfonylureas (will need to monitor blood sugars for hypoglycemia) Side effects (most common): Most common  side effects are nausea, gas, bloating and constipation.  Other possible side effects are headaches, belching, diarrhea, tiredness (fatigue), vomiting, upset stomach, dizziness, heartburn and stomach (abdominal pain).  If you think that you are becoming dehydrated, please inform our office or your primary family provider.  Stop immediately and go to ER if you have any symptoms of a serious allergic reaction including swelling of your face, lips, tongue or throat; problems breathing or swallowing; severe rash or itching; fainting or feeling dizzy; or very rapid heart rate.

## 2022-12-19 NOTE — Progress Notes (Addendum)
Office: [phone]  /  Fax: [phone]  WEIGHT SUMMARY AND BIOMETRICS  Weight Lost Since Last Visit: 5lb  Weight Gained Since Last Visit: 0lb   Vitals Temp: 97.8 F (36.6 C) BP: 132/87 Pulse Rate: 85 SpO2: 98 %   Anthropometric Measurements Height: 5\' 9"  (1.753 m) Weight: 296 lb (134.3 kg) BMI (Calculated): 43.69 Weight at Last Visit: 301lb Weight Lost Since Last Visit: 5lb Weight Gained Since Last Visit: 0lb Starting Weight: 301lb Total Weight Loss (lbs): 5 lb (2.268 kg)   Body Composition  Body Fat %: 35.3 % Fat Mass (lbs): 104.6 lbs Muscle Mass (lbs): 182.2 lbs Total Body Water (lbs): 133 lbs Visceral Fat Rating : 19   Other Clinical Data Fasting: No Labs: No Today's Visit #: 2 Starting Date: 12/05/22     HPI  Chief Complaint: OBESITY  [name] is here to discuss his progress with his obesity treatment plan. He is on the the Category 4 Plan and states he is following his eating plan approximately 75 % of the time. He states he is exercising 0 minutes 0 days per week.   Interval History:  Since last office visit he has lost 5 pounds.  He does well with breakfast and dinner. He struggles with lunch-feels due to his work.   He is eating a protein with each meal.  He is not snacking.  He drinking water and coffee daily.  Notes hunger and cravings.    Pharmacotherapy for weight loss: He is not currently taking medications  for medical weight loss.    Previous pharmacotherapy for medical weight loss:  None  Bariatric surgery:  Patient has not had bariatric surgery.  Fatty liver See imaging from 10/27/22. Denies abd pain.  Last CMP was 12/05/22.    Vit D deficiency  He is not currently taking Vit D.  Lab Results  Component Value Date   VD25OH 24.8 (L) 12/05/2022    PHYSICAL EXAM:  Blood pressure 132/87, pulse 85, temperature 97.8 F (36.6 C), height 5\' 9"  (1.753 m), weight 296 lb (134.3 kg), SpO2 98%. Body mass index is 43.71  kg/m.  General: He is overweight, cooperative, alert, well developed, and in no acute distress. PSYCH: Has normal mood, affect and thought process.   Extremities: No edema.  Neurologic: No gross sensory or motor deficits. No tremors or fasciculations noted.    DIAGNOSTIC DATA REVIEWED:  BMET    Component Value Date/Time   NA 137 12/05/2022 0835   K 4.5 12/05/2022 0835   CL 101 12/05/2022 0835   CO2 21 12/05/2022 0835   GLUCOSE 103 (H) 12/05/2022 0835   GLUCOSE 125 (H) 08/04/2021 0547   BUN 17 12/05/2022 0835   CREATININE 0.96 12/05/2022 0835   CALCIUM 8.8 12/05/2022 0835   GFRNONAA >60 08/04/2021 0547   Lab Results  Component Value Date   HGBA1C 5.8 (H) 09/23/2022   HGBA1C 5.3 08/02/2021   Lab Results  Component Value Date   INSULIN 16.3 12/05/2022   Lab Results  Component Value Date   TSH 2.520 12/05/2022   CBC    Component Value Date/Time   WBC 7.7 09/23/2022 1044   WBC 17.0 (H) 08/04/2021 0547   RBC 4.99 09/23/2022 1044   RBC 4.83 08/04/2021 0547   HGB 15.0 09/23/2022 1044   HCT 43.5 09/23/2022 1044   PLT 225 09/23/2022 1044   MCV 87 09/23/2022 1044   MCH 30.1 09/23/2022 1044   MCH 31.3 08/04/2021 0547   MCHC 34.5 09/23/2022 1044  MCHC 33.4 08/04/2021 0547   RDW 11.8 09/23/2022 1044   Iron Studies    Component Value Date/Time   IRON 53 09/30/2022 0946   TIBC 364 09/30/2022 0946   FERRITIN 83 09/30/2022 0946   IRONPCTSAT 15 09/30/2022 0946   Lipid Panel     Component Value Date/Time   CHOL 157 09/23/2022 1044   TRIG 169 (H) 09/23/2022 1044   HDL 39 (L) 09/23/2022 1044   CHOLHDL 4.0 09/23/2022 1044   CHOLHDL 9.4 08/03/2021 0518   VLDL 32 08/03/2021 0518   LDLCALC 89 09/23/2022 1044   Hepatic Function Panel     Component Value Date/Time   PROT 7.0 12/05/2022 0835   ALBUMIN 4.5 12/05/2022 0835   AST 32 12/05/2022 0835   ALT 60 (H) 12/05/2022 0835   ALKPHOS 92 12/05/2022 0835   BILITOT 0.3 12/05/2022 0835      Component Value  Date/Time   TSH 2.520 12/05/2022 0835   Nutritional Lab Results  Component Value Date   VD25OH 24.8 (L) 12/05/2022     ASSESSMENT AND PLAN  TREATMENT PLAN FOR OBESITY:  Recommended Dietary Goals  Joenathan is currently in the action stage of change. As such, his goal is to continue weight management plan. He has agreed to the Category 4 Plan.  Behavioral Intervention  We discussed the following Behavioral Modification Strategies today: increasing lean protein intake, decreasing simple carbohydrates , increasing vegetables, increasing lower glycemic fruits, increasing fiber rich foods, avoiding skipping meals, increasing water intake, continue to practice mindfulness when eating, and planning for success.  Additional resources provided today: multiple handouts given today:  protein content of food, protein snacks, etc  Recommended Physical Activity Goals  Haleem has been advised to work up to 150 minutes of moderate intensity aerobic activity a week and strengthening exercises 2-3 times per week for cardiovascular health, weight loss maintenance and preservation of muscle mass.   He has agreed to Continue current level of physical activity  and Think about ways to increase daily physical activity and overcoming barriers to exercise   Pharmacotherapy We discussed various medication options to help Luisenrique with his weight loss efforts and we both agreed to start [facility] 0.25mg . Side effects discussed.  Contraindications:  Pancreatitis (active gallstones) Medullary thyroid cancer High triglycerides (>500)-will need labs prior to starting Multiple Endocrine Neoplasia syndrome type 2 (MEN 2) Trying to get pregnant Breastfeeding Use with caution with taking insulin or sulfonylureas (will need to monitor blood sugars for hypoglycemia)  ASSOCIATED CONDITIONS ADDRESSED TODAY  Action/Plan  Fatty liver Continue working on dietary changes, exercise and weight loss  Vitamin D  deficiency -     Start Vitamin D (Ergocalciferol); Take 1 capsule (50,000 Units total) by mouth every 7 (seven) days.  Dispense: 5 capsule; Refill: 0  Morbid obesity (HCC) -     Start [phone]; Inject 0.25 mg into the skin once a week.  Dispense: 2 mL; Refill: 0.  Side effects discussed.   BMI 40.0-44.9, adult (HCC) -     NGEXBM; Inject 0.25 mg into the skin once a week.  Dispense: 2 mL; Refill: 0      Labs reviewed in chart from 12/05/22 with patient.     Return in about 3 weeks (around 01/09/2023).Marland Kitchen He was informed of the importance of frequent follow up visits to maximize his success with intensive lifestyle modifications for his multiple health conditions.   ATTESTASTION STATEMENTS:  Reviewed by clinician on day of visit: allergies, medications, problem list, medical history,  surgical history, family history, social history, and previous encounter notes.   Theodis Sato. Charlea Nardo FNP-C

## 2022-12-19 NOTE — Telephone Encounter (Signed)
Received from [employer]: The requested medication and/or diagnosis are not a covered benefit and excluded from coverage in accordance with the terms and conditions of your plan benefit. Therefore, the request has been administratively denied  Patient informed.

## 2022-12-21 NOTE — Telephone Encounter (Signed)
Please advise 

## 2022-12-21 NOTE — Telephone Encounter (Signed)
 Mychart message sent.

## 2022-12-21 NOTE — Telephone Encounter (Signed)
Patient called wanting to talk to a CMA or provider about his medication being denied. He wants to know his next steps.

## 2022-12-26 NOTE — Progress Notes (Unsigned)
TeleHealth Visit:  This visit was completed with telemedicine (audio/video) technology. [name] has verbally consented to this TeleHealth visit. The patient is located at home, the provider is located at home. The participants in this visit include the listed provider and patient. The visit was conducted today via MyChart video.  OBESITY [name] is here to discuss his progress with his obesity treatment plan along with follow-up of his obesity related diagnoses.   Today's visit was # 3 Starting weight: 301 lbs Starting date: 12/05/22 Weight at last in office visit: 296 lbs on 12/19/22 Total weight loss: 5 lbs at last in office visit on 12/19/22. Today's reported weight (12/27/22): none reported  Nutrition Plan: the Category 4 plan   Current exercise:  none  Interim History:  He was prescribed Wegovy at his last office visit but coverage was denied by insurance.  He sent a MyChart message regarding possibility of being prescribed compounded semaglutide. He was advised by Irene Limbo NP that we do not recommend using compounded semaglutide. He finds it hard to stick to plan He eats breakfast on plan 3/5 days in work week. He likes to eat a [employer] egg, cheese, sausage croissant for breakfast (540 cal, 16 gms protein). He likes to have sheets burrito for lunch (340 cal, 22 gms protein). Drinks 8 bottles of water daily. Drinks coffee with [place] Vanilla creamer 40 oz with 1/4 cup creamer. Not eating all of the food on the plan., Protein intake is less than prescribed., Is skipping meals, Reports polyphagia, and Reports excessive cravings.  Notes hunger and cravings. He needs to do some of his visits virtually because of having to take too much time off work. Assessment/Plan:  1.  Attention deficit disorder He reports diagnosis of attention deficit disorder.  He is not currently on medication.  Plan: Start combination of naltrexone and bupropion.  2. Gastroesophageal  reflux disease without esophagitis Symptoms are not well-controlled.  He takes Tums 8 tablets at a time without relief.  Plan: Start OTC Pepcid per package directions. Work on cutting out [employer]. Continue Tums as needed. Advised that weight loss and reducing fast food should reduce symptoms.  3. 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  Had liver elastography 10/27/2022 which documents fatty liver. ALT elevated at 60 on 12/05/2022. [name] continues to work on weight loss to improve this condition. Denies abdominal pain or jaundice.  Lab Results  Component Value Date   ALT 60 (H) 12/05/2022   AST 32 12/05/2022   ALKPHOS 92 12/05/2022   BILITOT 0.3 12/05/2022    Plan: Continue to monitor liver enzymes. Continue to work on weight loss with meal plan. Advised that fatty liver infiltration will improve with weight loss.  Morbid Obesity: Current BMI 43  Pharmacotherapy Plan Start bupropion XL 150 mg every morning. Also start naltrexone 50 mg-take one half tab every morning morning  [name] is currently in the action stage of change. As such, his goal is to continue with weight loss efforts.  He has agreed to the Category 4 plan.  Breakdown of calories and protein for each meal sent to patient via MyChart. Also advised patient: 1.  May have [employer] delight muffin sandwich with a protein shake for breakfast. 2.  Work on packing lunch the night before to take with you the next day. 3.  You may have a Lindie Spruce burrito but add a yogurt or 2 cheese sticks to this to meet protein goals. 4.  The more you  reduce eating out the better off you will do with the meal plan.  Exercise goals: No exercise has been prescribed at this time.  Behavioral modification strategies: increasing lean protein intake, decreasing simple carbohydrates , no meal skipping, decrease eating out, meal planning , decrease liquid calories, and planning for success.  [name] has  agreed to follow-up with our clinic in 2 weeks.  No orders of the defined types were placed in this encounter.   There are no discontinued medications.   Meds ordered this encounter  Medications   buPROPion (WELLBUTRIN XL) 150 MG 24 hr tablet    Sig: Take 1 tablet (150 mg total) by mouth in the morning.    Dispense:  30 tablet    Refill:  0    Order Specific Question:   Supervising Provider    Answer:   Glennis Brink [[id number]]   naltrexone (DEPADE) 50 MG tablet    Sig: Take 0.5 tablets (25 mg total) by mouth every morning.    Dispense:  15 tablet    Refill:  0    Order Specific Question:   Supervising Provider    Answer:   Glennis Brink [[id number]]   famotidine (PEPCID AC) 10 MG tablet    Sig: Take 1 tablet (10 mg total) by mouth 2 (two) times daily.    Order Specific Question:   Supervising Provider    Answer:   Glennis Brink [[id number]]      Objective:   VITALS: Per patient if applicable, see vitals. GENERAL: Alert and in no acute distress. CARDIOPULMONARY: No increased WOB. Speaking in clear sentences.  PSYCH: Pleasant and cooperative. Speech normal rate and rhythm. Affect is appropriate. Insight and judgement are appropriate. Attention is focused, linear, and appropriate.  NEURO: Oriented as arrived to appointment on time with no prompting.   Attestation Statements:   Reviewed by clinician on day of visit: allergies, medications, problem list, medical history, surgical history, family history, social history, and previous encounter notes.   This was prepared with the assistance of Engineer, civil (consulting).  Occasional wrong-word or sound-a-like substitutions may have occurred due to the inherent limitations of voice recognition software.

## 2022-12-27 ENCOUNTER — Encounter (INDEPENDENT_AMBULATORY_CARE_PROVIDER_SITE_OTHER): Payer: Self-pay | Admitting: Family Medicine

## 2022-12-27 ENCOUNTER — Telehealth (INDEPENDENT_AMBULATORY_CARE_PROVIDER_SITE_OTHER): Payer: 59 | Admitting: Family Medicine

## 2022-12-27 DIAGNOSIS — K219 Gastro-esophageal reflux disease without esophagitis: Secondary | ICD-10-CM | POA: Diagnosis not present

## 2022-12-27 DIAGNOSIS — F988 Other specified behavioral and emotional disorders with onset usually occurring in childhood and adolescence: Secondary | ICD-10-CM | POA: Diagnosis not present

## 2022-12-27 DIAGNOSIS — K760 Fatty (change of) liver, not elsewhere classified: Secondary | ICD-10-CM | POA: Diagnosis not present

## 2022-12-27 DIAGNOSIS — Z6841 Body Mass Index (BMI) 40.0 and over, adult: Secondary | ICD-10-CM

## 2022-12-27 MED ORDER — FAMOTIDINE 10 MG PO TABS
10.0000 mg | ORAL_TABLET | Freq: Two times a day (BID) | ORAL | Status: AC
Start: 2022-12-27 — End: ?

## 2022-12-27 MED ORDER — NALTREXONE HCL 50 MG PO TABS
25.0000 mg | ORAL_TABLET | Freq: Every morning | ORAL | 0 refills | Status: DC
Start: 2022-12-27 — End: 2023-01-09

## 2022-12-27 MED ORDER — BUPROPION HCL ER (XL) 150 MG PO TB24
150.0000 mg | ORAL_TABLET | Freq: Every morning | ORAL | 0 refills | Status: DC
Start: 2022-12-27 — End: 2023-01-09

## 2022-12-28 ENCOUNTER — Ambulatory Visit: Payer: 59 | Admitting: Family Medicine

## 2022-12-29 ENCOUNTER — Ambulatory Visit: Payer: 59 | Admitting: Family Medicine

## 2022-12-29 ENCOUNTER — Encounter: Payer: Self-pay | Admitting: Family Medicine

## 2022-12-29 VITALS — BP 124/85 | HR 80 | Ht 69.0 in | Wt 305.4 lb

## 2022-12-29 DIAGNOSIS — Z6841 Body Mass Index (BMI) 40.0 and over, adult: Secondary | ICD-10-CM

## 2022-12-29 DIAGNOSIS — B353 Tinea pedis: Secondary | ICD-10-CM | POA: Diagnosis not present

## 2022-12-29 DIAGNOSIS — F17200 Nicotine dependence, unspecified, uncomplicated: Secondary | ICD-10-CM | POA: Diagnosis not present

## 2022-12-29 DIAGNOSIS — J45901 Unspecified asthma with (acute) exacerbation: Secondary | ICD-10-CM

## 2022-12-29 MED ORDER — BUDESONIDE-FORMOTEROL FUMARATE 80-4.5 MCG/ACT IN AERO
2.0000 | INHALATION_SPRAY | Freq: Two times a day (BID) | RESPIRATORY_TRACT | 3 refills | Status: AC
Start: 2022-12-29 — End: ?

## 2022-12-29 MED ORDER — CICLOPIROX OLAMINE 0.77 % EX CREA: TOPICAL_CREAM | Freq: Two times a day (BID) | CUTANEOUS | 1 refills | Status: DC

## 2022-12-29 NOTE — Progress Notes (Unsigned)
   Established Patient Office Visit  Subjective   Patient ID: [name], male    DOB: 18-Oct-1995  Age: 27 y.o. MRN: [id number]  Chief Complaint  Patient presents with   Weight Check    HPI  Weight -patient has been seeing the nutritionist.  Is on a diet plan.  Was going to start [facility] but insurance did not cover it.  Appears to add naltrexone to his bupropion to help with weight.  Patient stopped smoking.  Started using Zyn nicotine patches instead.  Asthma/sob -was only taking his Symbicort once a day.  Has now been taking it twice a day.  Scar on leg-patient no longer putting any creams on it.  Pain is better.  Only noticed enough he is pressing on it.  Athletes foot -patient has tried over-the-counter athlete's foot medication.  Has tried natural remedies such as vinegar, hydroperoxide, alcohol.  Changes his socks daily.  Mostly has itching in between the toes.   The ASCVD Risk score (Arnett DK, et al., 2019) failed to calculate for the following reasons:   The 2019 ASCVD risk score is only valid for ages 64 to 70     ROS    Objective:     BP 124/85   Pulse 80   Ht 5\' 9"  (1.753 m)   Wt (!) 305 lb 6.4 oz (138.5 kg)   SpO2 95%   BMI 45.10 kg/m    Physical Exam General: Alert, oriented CV: Regular rhythm Pulmonary: Lungs clear bilaterally Extremities: Tinea pedis in the webbing of the toes.   No results found for any visits on 12/29/22.        Assessment & Plan:   Tinea pedis of both feet Assessment & Plan: Ciclopirox prescribed   Asthma, chronic, unspecified asthma severity, with acute exacerbation -     Budesonide-Formoterol Fumarate; Inhale 2 puffs into the lungs 2 (two) times daily.  Dispense: 1 each; Refill: 3  Class 3 severe obesity due to excess calories without serious comorbidity with body mass index (BMI) of 40.0 to 44.9 in adult [facility]) Assessment & Plan: Continues to go to healthy weight clinic.  On bupropion and naltrexone.   Insurance did not cover [employer].   Tobacco use disorder Assessment & Plan: Has switched to nicotine patches.   Other orders -     Ciclopirox Olamine; Apply topically 2 (two) times daily.  Dispense: 15 g; Refill: 1     Return in about 6 months (around 07/01/2023) for weight, lft, .    Sandre Kitty, MD

## 2022-12-29 NOTE — Patient Instructions (Signed)
It was nice to see you today,  We addressed the following topics today: - we refilled your symbicort.  Use it twice a day.  You can also use it as a rescue inhaler if you do not have your albuterol  - I have prescribed a cream to use on your foot twice a day every day for the next 4 weeks.  If this does not improve your symptoms let us know and we can schedule an appointment for a biopsy.    - keep your socks dry.  You can also use otc antifungal powder in your shoes.   Have a great day,  Frederic Jericho, MD

## 2023-01-01 DIAGNOSIS — B353 Tinea pedis: Secondary | ICD-10-CM | POA: Insufficient documentation

## 2023-01-01 NOTE — Assessment & Plan Note (Signed)
Continues to go to healthy weight clinic.  On bupropion and naltrexone.  Insurance did not cover [employer].

## 2023-01-01 NOTE — Assessment & Plan Note (Signed)
Ciclopirox prescribed. 

## 2023-01-01 NOTE — Assessment & Plan Note (Signed)
Has switched to nicotine patches.

## 2023-01-05 ENCOUNTER — Encounter: Payer: Self-pay | Admitting: Family Medicine

## 2023-01-05 NOTE — Progress Notes (Addendum)
TeleHealth Visit:  This visit was completed with telemedicine (audio/video) technology. [name] has verbally consented to this TeleHealth visit. The patient is located at home, the provider is located at home. The participants in this visit include the listed provider and patient. The visit was conducted today via MyChart video.  OBESITY [name] is here to discuss his progress with his obesity treatment plan along with follow-up of his obesity related diagnoses.   Today's visit was # 4 Starting weight: 301 lbs Starting date: 12/05/22 Weight at last in office visit: 296 lbs on 12/19/22 Total weight loss: 5 lbs at last in office visit on 12/19/22. 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 He doesn't feel the bupropion/naltexone is helping with appetite or cravings.  Currently taking 150 of bupropion and 25 mg of naltrexone. He is still eating at [place] twice daily.  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  He recently stopped smoking and is using nicotine pouches instead.  Protein intake is less than prescribed., Is drinking sugar sweetened beverages, Is not skipping meals, Not meeting protein goals., Water intake is adequate., Reports polyphagia, and Reports excessive cravings.  Pharmacotherapy: [name] is on bupropion/natrexone combo 150/25 mg-both in a.m. Adverse side effects: None Hunger is poorly controlled.  Cravings are poorly controlled.  Assessment/Plan:  1. Polyphagia Currently this is poorly controlled. Medication(s): Bupropion 150 mg every morning, naltrexone 25 mg every morning.  Reported side effects: None He does not feel that this combination is helping.  Plan: Continue and increase dose bupropion 300 XL daily. May take 2 of the 150 mg bupropion that he has to use them up. Continue  naltrexone 25 mg every morning.   2. Vitamin D Deficiency Vitamin D is not at goal of 50.  Most recent vitamin D level was 24.8.Marland Kitchen He is on  prescription ergocalciferol 50,000 IU weekly. Lab Results  Component Value Date   VD25OH 24.8 (L) 12/05/2022    Plan: Continue and refill  prescription ergocalciferol 50,000 IU weekly   3. Morbid Obesity: Current BMI 43  Pharmacotherapy Plan Continue and increase dose bupropion 300 XL daily. May take 2 of the 150 mg bupropion that he has to use them up. Continue naltrexone 25 mg every morning.  Consider phentermine/topiramate combo at next office visit if no results from bupropion/naltrexone.  [name] is currently in the action stage of change. As such, his goal is to continue with weight loss efforts.  He has agreed to the Category 4 plan.  1.  Encouraged him to download Lose It! app so he can see the calories in his [place] meals. 2.  Strongly encouraged him to pack food for lunch and eat breakfast at home. 3.  Encouraged him to cook healthy dinners and have his children try them also. 4.  Reduce intake of sugar sweetened beverages.  Exercise goals: No exercise has been prescribed at this time.  Behavioral modification strategies: increasing lean protein intake, decreasing simple carbohydrates , decrease eating out, meal planning , decrease liquid calories, planning for success, increasing vegetables, and pack lunch for work.  [name] has agreed to follow-up with our clinic in 4 weeks.  No orders of the defined types were placed in this encounter.   Medications Discontinued During This Encounter  Medication Reason   buPROPion (WELLBUTRIN XL) 150 MG 24  hr tablet    Vitamin D, Ergocalciferol, (DRISDOL) 1.25 MG (50000 UNIT) CAPS capsule Reorder   naltrexone (DEPADE) 50 MG tablet Reorder     Meds ordered this encounter  Medications   buPROPion (WELLBUTRIN XL) 300 MG 24 hr tablet    Sig: Take 1 tablet (300 mg total) by mouth daily.     Dispense:  30 tablet    Refill:  0    Order Specific Question:   Supervising Provider    Answer:   Glennis Brink [[id number]]   naltrexone (DEPADE) 50 MG tablet    Sig: Take 0.5 tablets (25 mg total) by mouth every morning.    Dispense:  15 tablet    Refill:  0    Order Specific Question:   Supervising Provider    Answer:   Seymour Bars E [[id number]]   Vitamin D, Ergocalciferol, (DRISDOL) 1.25 MG (50000 UNIT) CAPS capsule    Sig: Take 1 capsule (50,000 Units total) by mouth every 7 (seven) days.    Dispense:  5 capsule    Refill:  0    Order Specific Question:   Supervising Provider    Answer:   Glennis Brink [[id number]]      Objective:   VITALS: Per patient if applicable, see vitals. GENERAL: Alert and in no acute distress. CARDIOPULMONARY: No increased WOB. Speaking in clear sentences.  PSYCH: Pleasant and cooperative. Speech normal rate and rhythm. Affect is appropriate. Insight and judgement are appropriate. Attention is focused, linear, and appropriate.  NEURO: Oriented as arrived to appointment on time with no prompting.   Attestation Statements:   Reviewed by clinician on day of visit: allergies, medications, problem list, medical history, surgical history, family history, social history, and previous encounter notes.   This was prepared with the assistance of Engineer, civil (consulting).  Occasional wrong-word or sound-a-like substitutions may have occurred due to the inherent limitations of voice recognition software.

## 2023-01-09 ENCOUNTER — Telehealth (INDEPENDENT_AMBULATORY_CARE_PROVIDER_SITE_OTHER): Payer: 59 | Admitting: Family Medicine

## 2023-01-09 ENCOUNTER — Encounter (INDEPENDENT_AMBULATORY_CARE_PROVIDER_SITE_OTHER): Payer: Self-pay | Admitting: Family Medicine

## 2023-01-09 DIAGNOSIS — E559 Vitamin D deficiency, unspecified: Secondary | ICD-10-CM

## 2023-01-09 DIAGNOSIS — R632 Polyphagia: Secondary | ICD-10-CM | POA: Diagnosis not present

## 2023-01-09 DIAGNOSIS — Z6841 Body Mass Index (BMI) 40.0 and over, adult: Secondary | ICD-10-CM | POA: Diagnosis not present

## 2023-01-09 MED ORDER — NALTREXONE HCL 50 MG PO TABS
25.0000 mg | ORAL_TABLET | Freq: Every morning | ORAL | 0 refills | Status: DC
Start: 2023-01-09 — End: 2023-02-07

## 2023-01-09 MED ORDER — VITAMIN D (ERGOCALCIFEROL) 1.25 MG (50000 UNIT) PO CAPS
50000.0000 [IU] | ORAL_CAPSULE | ORAL | 0 refills | Status: DC
Start: 2023-01-09 — End: 2023-03-30

## 2023-01-09 MED ORDER — BUPROPION HCL ER (XL) 300 MG PO TB24
300.0000 mg | ORAL_TABLET | Freq: Every day | ORAL | 0 refills | Status: DC
Start: 2023-01-09 — End: 2023-02-07

## 2023-02-07 ENCOUNTER — Encounter: Payer: Self-pay | Admitting: Nurse Practitioner

## 2023-02-07 ENCOUNTER — Ambulatory Visit: Payer: 59 | Admitting: Nurse Practitioner

## 2023-02-07 VITALS — BP 133/81 | HR 92 | Temp 98.3°F | Ht 69.0 in | Wt 301.0 lb

## 2023-02-07 DIAGNOSIS — R632 Polyphagia: Secondary | ICD-10-CM | POA: Diagnosis not present

## 2023-02-07 DIAGNOSIS — Z6841 Body Mass Index (BMI) 40.0 and over, adult: Secondary | ICD-10-CM | POA: Diagnosis not present

## 2023-02-07 MED ORDER — TOPIRAMATE 50 MG PO TABS
ORAL_TABLET | ORAL | 0 refills | Status: DC
Start: 2023-02-07 — End: 2023-03-15

## 2023-02-07 MED ORDER — LOMAIRA 8 MG PO TABS
1.0000 | ORAL_TABLET | Freq: Every day | ORAL | 0 refills | Status: DC
Start: 2023-02-07 — End: 2023-03-30

## 2023-02-07 NOTE — Progress Notes (Signed)
Office: [phone]  /  Fax: [phone]  WEIGHT SUMMARY AND BIOMETRICS  Weight Lost Since Last Visit: 0lb  Weight Gained Since Last Visit: 5lb   Vitals Temp: 98.3 F (36.8 C) BP: 133/81 Pulse Rate: 92 SpO2: 97 %   Anthropometric Measurements Height: 5\' 9"  (1.753 m) Weight: (!) 301 lb (136.5 kg) BMI (Calculated): 44.43 Weight at Last Visit: 296lb Weight Lost Since Last Visit: 0lb Weight Gained Since Last Visit: 5lb Starting Weight: 301lb Total Weight Loss (lbs): 0 lb (0 kg)   Body Composition  Body Fat %: 37 % Fat Mass (lbs): 111.4 lbs Muscle Mass (lbs): 180.6 lbs Total Body Water (lbs): 137 lbs Visceral Fat Rating : 20   Other Clinical Data Fasting: Yes Labs: no Today's Visit #: 5 Starting Date: 12/05/22     HPI  Chief Complaint: OBESITY  Meyson is here to discuss his progress with his obesity treatment plan. He is on the the Category 4 Plan and states he is following his eating plan approximately 50 % of the time. He states he is exercising 0 minutes 0 days per week.   Interval History:  Since last office visit he has gained 5 pounds.  He is struggling with hunger and cravings.  He is eating large portion sizes due to his hunger.  He tends to eat breakfast at [facility], his wife preps his lunch and he eats dinner at home.  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  Because of his job, he notes he is stressed and is occ stress eating.  He is drinking water and coffee.  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   He has stopped smoking and is using nicotine pouches.     Pharmacotherapy for weight loss: He is currently taking Wellbutrin XL 300mg  (increased after his last visit) and naltrexone 25mg .  Denies side effects. Doesn't feel that Wellbutrin and naltrexone has helped with hunger or cravings and would like to discuss additional options.     Unable to start GLP-1 due to cost.   Previous pharmacotherapy for medical weight loss:  none    Bariatric surgery:  Patient has not had bariatric surgery.     PHYSICAL EXAM:  Blood pressure 133/81, pulse 92, temperature 98.3 F (36.8 C), height 5\' 9"  (1.753 m), weight (!) 301 lb (136.5 kg), SpO2 97%. Body mass index is 44.45 kg/m.  General: He is overweight, cooperative, alert, well developed, and in no acute distress. PSYCH: Has normal mood, affect and thought process.   Extremities: No edema.  Neurologic: No gross sensory or motor deficits. No tremors or fasciculations noted.    DIAGNOSTIC DATA REVIEWED:  BMET    Component Value Date/Time   NA 137 12/05/2022 0835   K 4.5 12/05/2022 0835   CL 101 12/05/2022 0835   CO2 21 12/05/2022 0835   GLUCOSE 103 (H) 12/05/2022 0835   GLUCOSE 125 (H) 08/04/2021 0547   BUN 17 12/05/2022 0835   CREATININE 0.96 12/05/2022 0835   CALCIUM 8.8 12/05/2022 0835   GFRNONAA >60 08/04/2021 0547   Lab Results  Component Value Date   HGBA1C 5.8 (H) 09/23/2022   HGBA1C 5.3 08/02/2021   Lab Results  Component Value Date   INSULIN 16.3 12/05/2022   Lab Results  Component Value Date   TSH 2.520 12/05/2022   CBC    Component Value Date/Time   WBC 7.7 09/23/2022 1044   WBC 17.0 (H) 08/04/2021 0547   RBC 4.99 09/23/2022 1044  RBC 4.83 08/04/2021 0547   HGB 15.0 09/23/2022 1044   HCT 43.5 09/23/2022 1044   PLT 225 09/23/2022 1044   MCV 87 09/23/2022 1044   MCH 30.1 09/23/2022 1044   MCH 31.3 08/04/2021 0547   MCHC 34.5 09/23/2022 1044   MCHC 33.4 08/04/2021 0547   RDW 11.8 09/23/2022 1044   Iron Studies    Component Value Date/Time   IRON 53 09/30/2022 0946   TIBC 364 09/30/2022 0946   FERRITIN 83 09/30/2022 0946   IRONPCTSAT 15 09/30/2022 0946   Lipid Panel     Component Value Date/Time   CHOL 157 09/23/2022 1044   TRIG 169 (H) 09/23/2022 1044   HDL 39 (L) 09/23/2022 1044   CHOLHDL 4.0 09/23/2022 1044   CHOLHDL 9.4 08/03/2021 0518   VLDL 32 08/03/2021 0518   LDLCALC 89 09/23/2022 1044   Hepatic Function  Panel     Component Value Date/Time   PROT 7.0 12/05/2022 0835   ALBUMIN 4.5 12/05/2022 0835   AST 32 12/05/2022 0835   ALT 60 (H) 12/05/2022 0835   ALKPHOS 92 12/05/2022 0835   BILITOT 0.3 12/05/2022 0835      Component Value Date/Time   TSH 2.520 12/05/2022 0835   Nutritional Lab Results  Component Value Date   VD25OH 24.8 (L) 12/05/2022     ASSESSMENT AND PLAN  TREATMENT PLAN FOR OBESITY:  Recommended Dietary Goals  [place] is currently in the action stage of change. As such, his goal is to continue weight management plan. He has agreed to keeping a food journal and adhering to recommended goals of 1800-1900 calories and 100+ protein.  I've encouraged him to track and will review at his next visit.    Behavioral Intervention  We discussed the following Behavioral Modification Strategies today: increasing lean protein intake, decreasing simple carbohydrates , increasing vegetables, increasing lower glycemic fruits, increasing water intake, work on tracking and journaling calories using tracking application, continue to practice mindfulness when eating, and planning for success.  Additional resources provided today: NA  Recommended Physical Activity Goals  [place] has been advised to work up to 150 minutes of moderate intensity aerobic activity a week and strengthening exercises 2-3 times per week for cardiovascular health, weight loss maintenance and preservation of muscle mass.   He has agreed to Continue current level of physical activity , Think about ways to increase daily physical activity and overcoming barriers to exercise, and Increase physical activity in their day and reduce sedentary time (increase NEAT).   Pharmacotherapy We discussed various medication options to help [place] with his weight loss efforts and we both agreed to start start Lomaria 8mg  and Topamax for cravings.  Side effects discussed.  Discussed with Dr. Manson Passey.  ASSOCIATED CONDITIONS ADDRESSED  TODAY  Action/Plan  Polyphagia -     [name]; Take 1 tablet (8 mg total) by mouth daily.  Dispense: 30 tablet; Refill: 0 -     Topiramate; Take 1/2 tablet daily for one week and then increase to a full pill  Dispense: 30 tablet; Refill: 0  Morbid obesity (HCC): Starting BMI 44.4 -     [name]; Take 1 tablet (8 mg total) by mouth daily.  Dispense: 30 tablet; Refill: 0 -     Topiramate; Take 1/2 tablet daily for one week and then increase to a full pill  Dispense: 30 tablet; Refill: 0  BMI 40.0-44.9, adult (HCC) -     [name]; Take 1 tablet (8 mg total) by mouth daily.  Dispense: 30 tablet; Refill: 0 -     Topiramate; Take 1/2 tablet daily for one week and then increase to a full pill  Dispense: 30 tablet; Refill: 0         Return in about 4 weeks (around 03/07/2023).Marland Kitchen He was informed of the importance of frequent follow up visits to maximize his success with intensive lifestyle modifications for his multiple health conditions.   ATTESTASTION STATEMENTS:  Reviewed by clinician on day of visit: allergies, medications, problem list, medical history, surgical history, family history, social history, and previous encounter notes.     Theodis Sato. Jammy Plotkin FNP-C

## 2023-02-07 NOTE — Patient Instructions (Signed)
 Why [facility] Does Not Prescribe Compounded GLP-1 Drugs  1. Lack of FDA Approval  - Compounded GLP-1 drugs are not approved by the FDA. This means they haven't gone through the rigorous testing for safety, efficacy, and quality that approved medications have.  2. Quality and Consistency Issues  - Compounded drugs may vary in quality and potency. This inconsistency can lead to ineffective treatment or increased risk of serious side effects.  3. Safety Concerns  - Without FDA oversight, compounded drugs may contain impurities or incorrect dosages, posing serious health risks to patients.  4. Potential for Contamination  - Compounding pharmacies often do not follow the stringent manufacturing processes required for approved drugs, increasing the risk of contamination.  5. Lack of Clinical Trials  - Compounded drugs do not undergo clinical trials to prove their safety and effectiveness, unlike FDA-approved medications.  6. Legal Issues  - If a problem arises with a compounded medication, legal recourse may be limited if adverse effects occur due to the compounded medication.  7. Efficacy Concerns  - The efficacy of compounded GLP-1 drugs is not guaranteed, as they are not subjected to the same rigorous testing and standardization as FDA-approved drugs.  8. Professional Endorsements and Recommendations  - Major medical associations and healthcare professionals generally do not endorse the use of compounded medications when FDA-approved alternatives are available.

## 2023-03-09 ENCOUNTER — Ambulatory Visit: Payer: 59 | Admitting: Nurse Practitioner

## 2023-03-14 ENCOUNTER — Encounter: Payer: Self-pay | Admitting: Nurse Practitioner

## 2023-03-14 ENCOUNTER — Other Ambulatory Visit: Payer: Self-pay | Admitting: Nurse Practitioner

## 2023-03-14 DIAGNOSIS — R632 Polyphagia: Secondary | ICD-10-CM

## 2023-03-14 DIAGNOSIS — Z6841 Body Mass Index (BMI) 40.0 and over, adult: Secondary | ICD-10-CM

## 2023-03-15 ENCOUNTER — Other Ambulatory Visit: Payer: Self-pay | Admitting: Nurse Practitioner

## 2023-03-15 DIAGNOSIS — R632 Polyphagia: Secondary | ICD-10-CM

## 2023-03-15 DIAGNOSIS — Z6841 Body Mass Index (BMI) 40.0 and over, adult: Secondary | ICD-10-CM

## 2023-03-15 MED ORDER — TOPIRAMATE 50 MG PO TABS
ORAL_TABLET | ORAL | 0 refills | Status: DC
Start: 2023-03-15 — End: 2023-03-31

## 2023-03-30 ENCOUNTER — Ambulatory Visit: Payer: 59 | Admitting: Nurse Practitioner

## 2023-03-30 ENCOUNTER — Encounter: Payer: Self-pay | Admitting: Nurse Practitioner

## 2023-03-30 VITALS — BP 135/81 | HR 87 | Temp 98.0°F | Ht 69.0 in | Wt 293.0 lb

## 2023-03-30 DIAGNOSIS — Z6841 Body Mass Index (BMI) 40.0 and over, adult: Secondary | ICD-10-CM | POA: Diagnosis not present

## 2023-03-30 DIAGNOSIS — E559 Vitamin D deficiency, unspecified: Secondary | ICD-10-CM | POA: Diagnosis not present

## 2023-03-30 DIAGNOSIS — R632 Polyphagia: Secondary | ICD-10-CM

## 2023-03-30 MED ORDER — VITAMIN D (ERGOCALCIFEROL) 1.25 MG (50000 UNIT) PO CAPS
50000.0000 [IU] | ORAL_CAPSULE | ORAL | 0 refills | Status: DC
Start: 2023-03-30 — End: 2023-04-27

## 2023-03-30 MED ORDER — LOMAIRA 8 MG PO TABS
1.0000 | ORAL_TABLET | Freq: Every day | ORAL | 0 refills | Status: DC
Start: 2023-03-30 — End: 2023-04-27

## 2023-03-30 NOTE — Progress Notes (Signed)
Office: [phone]  /  Fax: [phone]  WEIGHT SUMMARY AND BIOMETRICS  Weight Lost Since Last Visit: 8lb  Weight Gained Since Last Visit: 0lb   Vitals Temp: 98 F (36.7 C) BP: 135/81 Pulse Rate: 87 SpO2: 97 %   Anthropometric Measurements Height: 5\' 9"  (1.753 m) Weight: 293 lb (132.9 kg) BMI (Calculated): 43.25 Weight at Last Visit: 301lb Weight Lost Since Last Visit: 8lb Weight Gained Since Last Visit: 0lb Starting Weight: 301lb Total Weight Loss (lbs): 8 lb (3.629 kg)   Body Composition  Body Fat %: 36 % Fat Mass (lbs): 105.4 lbs Muscle Mass (lbs): 178.4 lbs Total Body Water (lbs): 133.8 lbs Visceral Fat Rating : 19   Other Clinical Data Fasting: Yes Labs: No Today's Visit #: 6 Starting Date: 12/05/22     HPI  Chief Complaint: OBESITY  [name] is here to discuss his progress with his obesity treatment plan. He is on the the Category 4 Plan and states he is following his eating plan approximately 80 % of the time. He states he is exercising 0 minutes 0 days per week.   Interval History:  Since last office visit he has lost 8 pounds.  He is not skipping meals, aiming to eat more protein and is drinking water daily.  He is also drinking 1 Cheerwine and coffee.  He is planning to join a gym.     Pharmacotherapy for weight loss: He is currently taking [name] 8mg  and Topamax 50mg  1/2 tablet (has been out for 1 week) for medical weight loss.  Denies side effects.    Unable to start GLP-1 due to cost   Previous pharmacotherapy for medical weight loss:  Wellbutrin XL and naltrexone.    Bariatric surgery:  Patient has not had bariatric surgery  Vit D deficiency  He is taking Vit D 50,000 IU weekly.  Denies side effects.  Denies nausea, vomiting or muscle weakness.    Lab Results  Component Value Date   VD25OH 24.8 (L) 12/05/2022     PHYSICAL EXAM:  Blood pressure 135/81, pulse 87, temperature 98 F (36.7 C), height 5\' 9"  (1.753 m), weight 293  lb (132.9 kg), SpO2 97%. Body mass index is 43.27 kg/m.  General: He is overweight, cooperative, alert, well developed, and in no acute distress. PSYCH: Has normal mood, affect and thought process.   Extremities: No edema.  Neurologic: No gross sensory or motor deficits. No tremors or fasciculations noted.    DIAGNOSTIC DATA REVIEWED:  BMET    Component Value Date/Time   NA 137 12/05/2022 0835   K 4.5 12/05/2022 0835   CL 101 12/05/2022 0835   CO2 21 12/05/2022 0835   GLUCOSE 103 (H) 12/05/2022 0835   GLUCOSE 125 (H) 08/04/2021 0547   BUN 17 12/05/2022 0835   CREATININE 0.96 12/05/2022 0835   CALCIUM 8.8 12/05/2022 0835   GFRNONAA >60 08/04/2021 0547   Lab Results  Component Value Date   HGBA1C 5.8 (H) 09/23/2022   HGBA1C 5.3 08/02/2021   Lab Results  Component Value Date   INSULIN 16.3 12/05/2022   Lab Results  Component Value Date   TSH 2.520 12/05/2022   CBC    Component Value Date/Time   WBC 7.7 09/23/2022 1044   WBC 17.0 (H) 08/04/2021 0547   RBC 4.99 09/23/2022 1044   RBC 4.83 08/04/2021 0547   HGB 15.0 09/23/2022 1044   HCT 43.5 09/23/2022 1044   PLT 225 09/23/2022 1044   MCV 87 09/23/2022 1044  MCH 30.1 09/23/2022 1044   MCH 31.3 08/04/2021 0547   MCHC 34.5 09/23/2022 1044   MCHC 33.4 08/04/2021 0547   RDW 11.8 09/23/2022 1044   Iron Studies    Component Value Date/Time   IRON 53 09/30/2022 0946   TIBC 364 09/30/2022 0946   FERRITIN 83 09/30/2022 0946   IRONPCTSAT 15 09/30/2022 0946   Lipid Panel     Component Value Date/Time   CHOL 157 09/23/2022 1044   TRIG 169 (H) 09/23/2022 1044   HDL 39 (L) 09/23/2022 1044   CHOLHDL 4.0 09/23/2022 1044   CHOLHDL 9.4 08/03/2021 0518   VLDL 32 08/03/2021 0518   LDLCALC 89 09/23/2022 1044   Hepatic Function Panel     Component Value Date/Time   PROT 7.0 12/05/2022 0835   ALBUMIN 4.5 12/05/2022 0835   AST 32 12/05/2022 0835   ALT 60 (H) 12/05/2022 0835   ALKPHOS 92 12/05/2022 0835   BILITOT  0.3 12/05/2022 0835      Component Value Date/Time   TSH 2.520 12/05/2022 0835   Nutritional Lab Results  Component Value Date   VD25OH 24.8 (L) 12/05/2022     ASSESSMENT AND PLAN  TREATMENT PLAN FOR OBESITY:  Recommended Dietary Goals  [place] is currently in the action stage of change. As such, his goal is to continue weight management plan. He has agreed to the Category 4 Plan-1800-1900 calories and 100+ grams of protein.   Behavioral Intervention  We discussed the following Behavioral Modification Strategies today: continue to work on maintaining a reduced calorie state, getting the recommended amount of protein, incorporating whole foods, making healthy choices, staying well hydrated and practicing mindfulness when eating..  Additional resources provided today: NA  Recommended Physical Activity Goals  [place] has been advised to work up to 150 minutes of moderate intensity aerobic activity a week and strengthening exercises 2-3 times per week for cardiovascular health, weight loss maintenance and preservation of muscle mass.   He has agreed to Think about enjoyable ways to increase daily physical activity and overcoming barriers to exercise, Increase physical activity in their day and reduce sedentary time (increase NEAT)., Start strengthening exercises with a goal of 2-3 sessions a week , and Work on scheduling and tracking physical activity.    Pharmacotherapy We discussed various medication options to help [place] with his weight loss efforts and we both agreed to continue [name] 8mg  for medical weight loss and Topamax 50mg  1/2 tablet for food impulse control and cravings.  ASSOCIATED CONDITIONS ADDRESSED TODAY  Action/Plan  Vitamin D deficiency -     Vitamin D (Ergocalciferol); Take 1 capsule (50,000 Units total) by mouth every 7 (seven) days.  Dispense: 5 capsule; Refill: 0  Polyphagia -     [name]; Take 1 tablet (8 mg total) by mouth daily.  Dispense: 30  tablet; Refill: 0  Morbid obesity (HCC): Starting BMI 44.4 -     [name]; Take 1 tablet (8 mg total) by mouth daily.  Dispense: 30 tablet; Refill: 0  BMI 40.0-44.9, adult (HCC) -     [name]; Take 1 tablet (8 mg total) by mouth daily.  Dispense: 30 tablet; Refill: 0      Will obtain labs next month   Return in about 4 weeks (around 04/27/2023).Marland Kitchen He was informed of the importance of frequent follow up visits to maximize his success with intensive lifestyle modifications for his multiple health conditions.   ATTESTASTION STATEMENTS:  Reviewed by clinician on day of visit: allergies, medications, problem  list, medical history, surgical history, family history, social history, and previous encounter notes.     Theodis Sato. Daichi Moris FNP-C

## 2023-03-31 ENCOUNTER — Other Ambulatory Visit: Payer: Self-pay | Admitting: Nurse Practitioner

## 2023-03-31 DIAGNOSIS — Z6841 Body Mass Index (BMI) 40.0 and over, adult: Secondary | ICD-10-CM

## 2023-03-31 DIAGNOSIS — R632 Polyphagia: Secondary | ICD-10-CM

## 2023-04-03 MED ORDER — TOPIRAMATE 50 MG PO TABS: ORAL_TABLET | ORAL | 0 refills | Status: DC

## 2023-04-27 ENCOUNTER — Encounter: Payer: Self-pay | Admitting: Nurse Practitioner

## 2023-04-27 ENCOUNTER — Ambulatory Visit (INDEPENDENT_AMBULATORY_CARE_PROVIDER_SITE_OTHER): Payer: 59 | Admitting: Nurse Practitioner

## 2023-04-27 VITALS — BP 131/83 | HR 84 | Temp 98.3°F | Ht 69.0 in | Wt 283.0 lb

## 2023-04-27 DIAGNOSIS — R632 Polyphagia: Secondary | ICD-10-CM

## 2023-04-27 DIAGNOSIS — R5383 Other fatigue: Secondary | ICD-10-CM | POA: Diagnosis not present

## 2023-04-27 DIAGNOSIS — Z79899 Other long term (current) drug therapy: Secondary | ICD-10-CM

## 2023-04-27 DIAGNOSIS — R748 Abnormal levels of other serum enzymes: Secondary | ICD-10-CM | POA: Diagnosis not present

## 2023-04-27 DIAGNOSIS — E559 Vitamin D deficiency, unspecified: Secondary | ICD-10-CM

## 2023-04-27 DIAGNOSIS — Z6841 Body Mass Index (BMI) 40.0 and over, adult: Secondary | ICD-10-CM

## 2023-04-27 MED ORDER — LOMAIRA 8 MG PO TABS: 1.0000 | ORAL_TABLET | Freq: Every day | ORAL | 0 refills | Status: DC

## 2023-04-27 MED ORDER — TOPIRAMATE 50 MG PO TABS
ORAL_TABLET | ORAL | 0 refills | Status: DC
Start: 2023-04-27 — End: 2023-05-30

## 2023-04-27 MED ORDER — VITAMIN D (ERGOCALCIFEROL) 1.25 MG (50000 UNIT) PO CAPS: 50000.0000 [IU] | ORAL_CAPSULE | ORAL | 0 refills | Status: DC

## 2023-04-27 NOTE — Progress Notes (Signed)
Office: [phone]  /  Fax: [phone]  WEIGHT SUMMARY AND BIOMETRICS  Weight Lost Since Last Visit: 10lb  Weight Gained Since Last Visit: 0lb   Vitals Temp: 98.3 F (36.8 C) BP: 131/83 Pulse Rate: 84 SpO2: 100 %   Anthropometric Measurements Height: 5\' 9"  (1.753 m) Weight: 283 lb (128.4 kg) BMI (Calculated): 41.77 Weight at Last Visit: 293lb Weight Lost Since Last Visit: 10lb Weight Gained Since Last Visit: 0lb Starting Weight: 301lb Total Weight Loss (lbs): 18 lb (8.165 kg)   Body Composition  Body Fat %: 34.7 % Fat Mass (lbs): 98.4 lbs Muscle Mass (lbs): 176.4 lbs Total Body Water (lbs): 128.2 lbs Visceral Fat Rating : 18   Other Clinical Data Fasting: Yes Labs: Yes Today's Visit #: 7 Starting Date: 12/05/22     HPI  Chief Complaint: OBESITY  [name] is here to discuss his progress with his obesity treatment plan. He is on the the Category 4 Plan and states he is following his eating plan approximately 90 % of the time. He states he is exercising 0 minutes 0 days per week.   Interval History:  Since last office visit he has lost 10 pounds.  Denies skipping meals, aiming to eat a protein with each meal and is drinking water daily.  He is making healthier choices and watching his portion sizes.  Denies polyphagia or cravings.   Reports some fatigue and is requesting labs today-requesting testosterone   Pharmacotherapy for weight loss:  He is taking [name] 8mg  and Topamax 50mg .  Denies side effects.  Doing well. Both have helped with polyphagia and cravings.    Previous pharmacotherapy for medical weight loss:  Wellbutrin XL and naltrexone.     Bariatric surgery:  Patient has not had bariatric surgery   Vit D deficiency  He is taking Vit D 50,000 IU weekly.  Denies side effects.  Denies nausea, vomiting or muscle weakness.     PHYSICAL EXAM:  Blood pressure 131/83, pulse 84, temperature 98.3 F (36.8 C), height 5\' 9"  (1.753 m), weight 283  lb (128.4 kg), SpO2 100%. Body mass index is 41.79 kg/m.  General: He is overweight, cooperative, alert, well developed, and in no acute distress. PSYCH: Has normal mood, affect and thought process.   Extremities: No edema.  Neurologic: No gross sensory or motor deficits. No tremors or fasciculations noted.    DIAGNOSTIC DATA REVIEWED:  BMET    Component Value Date/Time   NA 137 12/05/2022 0835   K 4.5 12/05/2022 0835   CL 101 12/05/2022 0835   CO2 21 12/05/2022 0835   GLUCOSE 103 (H) 12/05/2022 0835   GLUCOSE 125 (H) 08/04/2021 0547   BUN 17 12/05/2022 0835   CREATININE 0.96 12/05/2022 0835   CALCIUM 8.8 12/05/2022 0835   GFRNONAA >60 08/04/2021 0547   Lab Results  Component Value Date   HGBA1C 5.8 (H) 09/23/2022   HGBA1C 5.3 08/02/2021   Lab Results  Component Value Date   INSULIN 16.3 12/05/2022   Lab Results  Component Value Date   TSH 2.520 12/05/2022   CBC    Component Value Date/Time   WBC 7.7 09/23/2022 1044   WBC 17.0 (H) 08/04/2021 0547   RBC 4.99 09/23/2022 1044   RBC 4.83 08/04/2021 0547   HGB 15.0 09/23/2022 1044   HCT 43.5 09/23/2022 1044   PLT 225 09/23/2022 1044   MCV 87 09/23/2022 1044   MCH 30.1 09/23/2022 1044   MCH 31.3 08/04/2021 0547   MCHC 34.5  09/23/2022 1044   MCHC 33.4 08/04/2021 0547   RDW 11.8 09/23/2022 1044   Iron Studies    Component Value Date/Time   IRON 53 09/30/2022 0946   TIBC 364 09/30/2022 0946   FERRITIN 83 09/30/2022 0946   IRONPCTSAT 15 09/30/2022 0946   Lipid Panel     Component Value Date/Time   CHOL 157 09/23/2022 1044   TRIG 169 (H) 09/23/2022 1044   HDL 39 (L) 09/23/2022 1044   CHOLHDL 4.0 09/23/2022 1044   CHOLHDL 9.4 08/03/2021 0518   VLDL 32 08/03/2021 0518   LDLCALC 89 09/23/2022 1044   Hepatic Function Panel     Component Value Date/Time   PROT 7.0 12/05/2022 0835   ALBUMIN 4.5 12/05/2022 0835   AST 32 12/05/2022 0835   ALT 60 (H) 12/05/2022 0835   ALKPHOS 92 12/05/2022 0835   BILITOT  0.3 12/05/2022 0835      Component Value Date/Time   TSH 2.520 12/05/2022 0835   Nutritional Lab Results  Component Value Date   VD25OH 24.8 (L) 12/05/2022     ASSESSMENT AND PLAN  TREATMENT PLAN FOR OBESITY:  Recommended Dietary Goals  [name] is currently in the action stage of change. As such, his goal is to continue weight management plan. He has agreed to the Category 4 Plan.  Behavioral Intervention  We discussed the following Behavioral Modification Strategies today: continue to work on maintaining a reduced calorie state, getting the recommended amount of protein, incorporating whole foods, making healthy choices, staying well hydrated and practicing mindfulness when eating..  Additional resources provided today: NA  Recommended Physical Activity Goals  [name] has been advised to work up to 150 minutes of moderate intensity aerobic activity a week and strengthening exercises 2-3 times per week for cardiovascular health, weight loss maintenance and preservation of muscle mass.   He has agreed to Think about enjoyable ways to increase daily physical activity and overcoming barriers to exercise, Increase physical activity in their day and reduce sedentary time (increase NEAT)., and Work on scheduling and tracking physical activity.    Pharmacotherapy We discussed various medication options to help [name] with his weight loss efforts and we both agreed to continue [name] 8mg  and Topamax 50mg .  Side effects discussed.  ASSOCIATED CONDITIONS ADDRESSED TODAY  Action/Plan  Polyphagia -     Topiramate; Take 1 tablet po daily  Dispense: 30 tablet; Refill: 0 -     [name]; Take 1 tablet (8 mg total) by mouth daily.  Dispense: 30 tablet; Refill: 0  Other fatigue -     Testosterone  Elevated liver enzymes -     Comprehensive metabolic panel  Vitamin D deficiency -     Vitamin D (Ergocalciferol); Take 1 capsule (50,000 Units total) by mouth every 7 (seven) days.   Dispense: 5 capsule; Refill: 0 -     VITAMIN D 25 Hydroxy (Vit-D Deficiency, Fractures)  Medication management -     Comprehensive metabolic panel  Morbid obesity (HCC): Starting BMI 44.4 -     Topiramate; Take 1 tablet po daily  Dispense: 30 tablet; Refill: 0 -     [name]; Take 1 tablet (8 mg total) by mouth daily.  Dispense: 30 tablet; Refill: 0  BMI 40.0-44.9, adult (HCC) -     Topiramate; Take 1 tablet po daily  Dispense: 30 tablet; Refill: 0 -     [name]; Take 1 tablet (8 mg total) by mouth daily.  Dispense: 30 tablet; Refill: 0  Return in about 4 weeks (around 05/25/2023).Marland Kitchen He was informed of the importance of frequent follow up visits to maximize his success with intensive lifestyle modifications for his multiple health conditions.   ATTESTASTION STATEMENTS:  Reviewed by clinician on day of visit: allergies, medications, problem list, medical history, surgical history, family history, social history, and previous encounter notes.     Theodis Sato. Amye Grego FNP-C

## 2023-04-28 LAB — COMPREHENSIVE METABOLIC PANEL
ALT: 78 [IU]/L — ABNORMAL HIGH (ref 0–44)
AST: 36 [IU]/L (ref 0–40)
Albumin: 4.7 g/dL (ref 4.3–5.2)
Alkaline Phosphatase: 85 [IU]/L (ref 44–121)
BUN/Creatinine Ratio: 9 (ref 9–20)
BUN: 10 mg/dL (ref 6–20)
Bilirubin Total: 0.4 mg/dL (ref 0.0–1.2)
CO2: 19 mmol/L — ABNORMAL LOW (ref 20–29)
Calcium: 9.5 mg/dL (ref 8.7–10.2)
Chloride: 109 mmol/L — ABNORMAL HIGH (ref 96–106)
Creatinine, Ser: 1.17 mg/dL (ref 0.76–1.27)
Globulin, Total: 2.3 g/dL (ref 1.5–4.5)
Glucose: 101 mg/dL — ABNORMAL HIGH (ref 70–99)
Potassium: 4.4 mmol/L (ref 3.5–5.2)
Sodium: 142 mmol/L (ref 134–144)
Total Protein: 7 g/dL (ref 6.0–8.5)
eGFR: 87 mL/min/{1.73_m2} (ref >59)

## 2023-04-28 LAB — TESTOSTERONE: Testosterone: 385 ng/dL (ref 264–916)

## 2023-04-28 LAB — VITAMIN D 25 HYDROXY (VIT D DEFICIENCY, FRACTURES): Vit D, 25-Hydroxy: 38.6 ng/mL (ref 30.0–100.0)

## 2023-05-16 ENCOUNTER — Encounter (HOSPITAL_BASED_OUTPATIENT_CLINIC_OR_DEPARTMENT_OTHER): Payer: Self-pay | Admitting: Emergency Medicine

## 2023-05-16 ENCOUNTER — Other Ambulatory Visit: Payer: Self-pay

## 2023-05-16 ENCOUNTER — Emergency Department (HOSPITAL_BASED_OUTPATIENT_CLINIC_OR_DEPARTMENT_OTHER): Payer: 59 | Admitting: Radiology

## 2023-05-16 DIAGNOSIS — B349 Viral infection, unspecified: Secondary | ICD-10-CM | POA: Diagnosis not present

## 2023-05-16 DIAGNOSIS — Z87891 Personal history of nicotine dependence: Secondary | ICD-10-CM | POA: Insufficient documentation

## 2023-05-16 DIAGNOSIS — J45909 Unspecified asthma, uncomplicated: Secondary | ICD-10-CM | POA: Diagnosis not present

## 2023-05-16 DIAGNOSIS — R5383 Other fatigue: Secondary | ICD-10-CM | POA: Diagnosis present

## 2023-05-16 DIAGNOSIS — Z1152 Encounter for screening for COVID-19: Secondary | ICD-10-CM | POA: Diagnosis not present

## 2023-05-16 LAB — SARS CORONAVIRUS 2 BY RT PCR: SARS Coronavirus 2 by RT PCR: NEGATIVE

## 2023-05-16 NOTE — ED Triage Notes (Addendum)
Patient presents with generalized fatigue, chills, subjective fever and SOB since this am.  Denies sick contacts

## 2023-05-17 ENCOUNTER — Emergency Department (HOSPITAL_BASED_OUTPATIENT_CLINIC_OR_DEPARTMENT_OTHER)
Admission: EM | Admit: 2023-05-17 | Discharge: 2023-05-17 | Disposition: A | Discharge status: Home / Self Care | Payer: 59 | Attending: Emergency Medicine | Admitting: Emergency Medicine

## 2023-05-17 DIAGNOSIS — B349 Viral infection, unspecified: Secondary | ICD-10-CM

## 2023-05-17 LAB — RESP PANEL BY RT-PCR (RSV, FLU A&B, COVID)  RVPGX2
Influenza A by PCR: NEGATIVE
Influenza B by PCR: NEGATIVE
Resp Syncytial Virus by PCR: NEGATIVE
SARS Coronavirus 2 by RT PCR: NEGATIVE

## 2023-05-17 MED ORDER — ONDANSETRON 4 MG PO TBDP: 4.0000 mg | ORAL_TABLET | Freq: Three times a day (TID) | ORAL | 0 refills | Status: DC | PRN

## 2023-05-17 MED ORDER — KETOROLAC TROMETHAMINE 15 MG/ML IJ SOLN
30.0000 mg | Freq: Once | INTRAMUSCULAR | Status: AC
  Administered 2023-05-17: 30 mg via INTRAMUSCULAR
  Filled 2023-05-17: qty 2

## 2023-05-17 MED ORDER — NAPROXEN 500 MG PO TABS: 500.0000 mg | ORAL_TABLET | Freq: Two times a day (BID) | ORAL | 0 refills | Status: DC

## 2023-05-17 MED ORDER — ALBUTEROL SULFATE HFA 108 (90 BASE) MCG/ACT IN AERS
2.0000 | INHALATION_SPRAY | Freq: Once | RESPIRATORY_TRACT | Status: AC
  Administered 2023-05-17: 2 via RESPIRATORY_TRACT
  Filled 2023-05-17: qty 6.7

## 2023-05-17 NOTE — Discharge Instructions (Addendum)
You were evaluated in the Emergency Department and after careful evaluation, we did not find any emergent condition requiring admission or further testing in the hospital.  Your exam/testing today is overall reassuring.  Symptoms likely due to a viral or flulike illness.  You tested negative for flu and COVID and RSV.  Recommend plenty of fluids and rest at home.  Use the Naprosyn twice daily for discomfort, Zofran as needed for nausea.  Please return to the Emergency Department if you experience any worsening of your condition.   Thank you for allowing [place] to be a part of your care.

## 2023-05-17 NOTE — ED Provider Notes (Signed)
[facility] EMERGENCY [facility] Emergency Department Provider Note MRN:  [id number]  Arrival date & time: 05/17/23     Chief Complaint   Fatigue   History of Present Illness   [name] is a 27 y.o. year-old male with no pertinent past medical history presenting to the ED with chief complaint of Teague.  Generalized fatigue and malaise today with bodyaches, dyspnea, mild headache, chills.  No nausea vomiting or diarrhea, no chest pain, no abdominal pain.  Review of Systems  A thorough review of systems was obtained and all systems are negative except as noted in the HPI and PMH.   Patient's Health History    Past Medical History:  Diagnosis Date   Asthma    Hyponatremia 08/04/2021    History reviewed. No pertinent surgical history.  Family History  Problem Relation Age of Onset   Diabetes Mother    Cancer Mother    Cancer Father     Social History   Socioeconomic History   Marital status: Single    Spouse name: Not on file   Number of children: Not on file   Years of education: Not on file   Highest education level: Not on file  Occupational History   Not on file  Tobacco Use   Smoking status: Every Day    Current packs/day: 0.00    Types: Cigarettes    Last attempt to quit: 08/20/2021    Years since quitting: 1.7   Smokeless tobacco: Former  Substance and Sexual Activity   Alcohol use: Never   Drug use: Never   Sexual activity: Not on file  Other Topics Concern   Not on file  Social History Narrative   Not on file   Social Determinants of Health   Financial Resource Strain: Not on file  Food Insecurity: Not on file  Transportation Needs: Not on file  Physical Activity: Not on file  Stress: Not on file  Social Connections: Unknown (10/17/2021)   Received [facility]   Social Network    Social Network: Not on file  Intimate Partner Violence: Unknown (09/08/2021)   Received from [employer], [facility]   HITS     Physically Hurt: Not on file    Insult or Talk Down To: Not on file    Threaten Physical Harm: Not on file    Scream or Curse: Not on file     Physical Exam   Vitals:   05/17/23 0002 05/17/23 0054  BP: 138/77   Pulse: 100   Resp: 16   Temp:  99.3 F (37.4 C)  SpO2: 96%     CONSTITUTIONAL: Well-appearing, NAD NEURO/PSYCH:  Alert and oriented x 3, no focal deficits EYES:  eyes equal and reactive ENT/NECK:  no LAD, no JVD CARDIO: Regular rate, well-perfused, normal S1 and S2 PULM:  CTAB no wheezing or rhonchi GI/GU:  non-distended, non-tender MSK/SPINE:  No gross deformities, no edema SKIN:  no rash, atraumatic   *Additional and/or pertinent findings included in MDM below  Diagnostic and Interventional Summary    EKG Interpretation Date/Time:    Ventricular Rate:    PR Interval:    QRS Duration:    QT Interval:    QTC Calculation:   R Axis:      Text Interpretation:         Labs Reviewed  SARS CORONAVIRUS 2 BY RT PCR  RESP PANEL BY RT-PCR (RSV, FLU A&B, COVID)  [id number]    DG Chest 2  View  Final Result      Medications  ketorolac (TORADOL) 15 MG/ML injection 30 mg (has no administration in time range)     Procedures  /  Critical Care Procedures  ED Course and Medical Decision Making  Initial Impression and Ddx Favoring viral illness, possibly COVID or flu.  Complaining of shortness of breath however no increased work of breathing, lungs clear on my exam.  Past medical/surgical history that increases complexity of ED encounter: None otherwise healthy  Interpretation of Diagnostics I personally reviewed the Chest Xray and my interpretation is as follows: No lobar opacity, no pneumothorax  COVID flu and RSV negative  Patient Reassessment and Ultimate Disposition/Management     Discharged with return precautions, likely flulike illness.  Patient management required discussion with the following services or consulting groups:  None  Complexity of  Problems Addressed Acute illness or injury that poses threat of life of bodily function  Additional Data Reviewed and Analyzed Further history obtained from: None  Additional Factors Impacting ED Encounter Risk Prescriptions  Elmer Sow. Pilar Plate, MD [facility] [email or web address]  Final Clinical Impressions(s) / ED Diagnoses     ICD-10-CM   1. Viral illness  B34.9       ED Discharge Orders          Ordered    naproxen (NAPROSYN) 500 MG tablet  2 times daily        05/17/23 0130    ondansetron (ZOFRAN-ODT) 4 MG disintegrating tablet  Every 8 hours PRN        05/17/23 0130             Discharge Instructions Discussed with and Provided to Patient:    Discharge Instructions      You were evaluated in the Emergency Department and after careful evaluation, we did not find any emergent condition requiring admission or further testing in the hospital.  Your exam/testing today is overall reassuring.  Symptoms likely due to a viral or flulike illness.  You tested negative for flu and COVID and RSV.  Recommend plenty of fluids and rest at home.  Use the Naprosyn twice daily for discomfort, Zofran as needed for nausea.  Please return to the Emergency Department if you experience any worsening of your condition.   Thank you for allowing [place] to be a part of your care.      Sabas Sous, MD 05/17/23 [phone]

## 2023-05-23 ENCOUNTER — Ambulatory Visit: Payer: 59 | Admitting: Nurse Practitioner

## 2023-05-30 ENCOUNTER — Ambulatory Visit: Payer: 59 | Admitting: Family Medicine

## 2023-05-30 ENCOUNTER — Ambulatory Visit: Payer: 59 | Admitting: Nurse Practitioner

## 2023-05-30 ENCOUNTER — Encounter: Payer: Self-pay | Admitting: Family Medicine

## 2023-05-30 VITALS — BP 132/79 | HR 83 | Temp 97.9°F | Ht 69.0 in | Wt 281.0 lb

## 2023-05-30 DIAGNOSIS — E559 Vitamin D deficiency, unspecified: Secondary | ICD-10-CM | POA: Diagnosis not present

## 2023-05-30 DIAGNOSIS — K760 Fatty (change of) liver, not elsewhere classified: Secondary | ICD-10-CM | POA: Diagnosis not present

## 2023-05-30 DIAGNOSIS — R632 Polyphagia: Secondary | ICD-10-CM | POA: Diagnosis not present

## 2023-05-30 DIAGNOSIS — Z6841 Body Mass Index (BMI) 40.0 and over, adult: Secondary | ICD-10-CM

## 2023-05-30 MED ORDER — TOPIRAMATE 50 MG PO TABS: ORAL_TABLET | ORAL | 0 refills | Status: DC

## 2023-05-30 MED ORDER — LOMAIRA 8 MG PO TABS: 1.0000 | ORAL_TABLET | Freq: Every day | ORAL | 0 refills | Status: DC

## 2023-05-30 MED ORDER — VITAMIN D (ERGOCALCIFEROL) 1.25 MG (50000 UNIT) PO CAPS: 50000.0000 [IU] | ORAL_CAPSULE | ORAL | 0 refills | Status: DC

## 2023-05-30 NOTE — Progress Notes (Signed)
Office: [phone]  /  Fax: [phone]  WEIGHT SUMMARY AND BIOMETRICS  Starting Date: 12/05/22  Starting Weight: 301lb   Weight Lost Since Last Visit: 2lb   Vitals Temp: 97.9 F (36.6 C) BP: 132/79 Pulse Rate: 83 SpO2: 99 %   Body Composition  Body Fat %: 34.9 % Fat Mass (lbs): 98.2 lbs Muscle Mass (lbs): 174.2 lbs Total Body Water (lbs): 129 lbs Visceral Fat Rating : 18     HPI  Chief Complaint: OBESITY  [name] is here to discuss his progress with his obesity treatment plan. He is on the the Category 4 Plan and states he is following his eating plan approximately 60 % of the time. He states he is exercising 15 minutes 3 times per week.   Interval History:  Since last office visit he is down 2 lb He is doing well with his meals He has been less physically active He has sick between visits This gives him a net weight loss of 18 lb in the past 5 mos This is a 5.9% TBW loss He is taking [name] 8 mg in the morning and Topiramate 50 mg in the morning He has good control over snacking during the day and at night He is working on sleep and hydration  Pharmacotherapy: [name] 8 mg + Topiramate 50 mg once daily  PHYSICAL EXAM:  Blood pressure 132/79, pulse 83, temperature 97.9 F (36.6 C), height 5\' 9"  (1.753 m), weight 281 lb (127.5 kg), SpO2 99%. Body mass index is 41.5 kg/m.  General: He is overweight, cooperative, alert, well developed, and in no acute distress. PSYCH: Has normal mood, affect and thought process.   Lungs: Normal breathing effort, no conversational dyspnea.   ASSESSMENT AND PLAN  TREATMENT PLAN FOR OBESITY:  Recommended Dietary Goals  [name] is currently in the action stage of change. As such, his goal is to continue weight management plan. He has agreed to the Category 4 Plan.  Behavioral Intervention  We discussed the following Behavioral Modification Strategies today: increasing lean protein intake to established goals,  increasing vegetables, increasing fiber rich foods, increasing water intake , work on meal planning and preparation, keeping healthy foods at home, continue to practice mindfulness when eating, planning for success, and continue to work on maintaining a reduced calorie state, getting the recommended amount of protein, incorporating whole foods, making healthy choices, staying well hydrated and practicing mindfulness when eating..  Additional resources provided today: NA  Recommended Physical Activity Goals  [name] has been advised to work up to 150 minutes of moderate intensity aerobic activity a week and strengthening exercises 2-3 times per week for cardiovascular health, weight loss maintenance and preservation of muscle mass.   He has agreed to [employer] strengthening exercises with a goal of 2-3 sessions a week   Pharmacotherapy changes for the treatment of obesity: none  ASSOCIATED CONDITIONS ADDRESSED TODAY  Hepatic steatosis Assessment & Plan: [name] done 10/27/22 for hepatic steatosis Actively working on weight reduction with a  5.9% TBW loss in the past 5 mos of medically supervised weight management ALT remains mildly elevated, reviewed most recent lab results with patient today  Continue active plan for weight reduction If covered, consider change to [facility] given new FDA indication for hepatic steatosis   Polyphagia Assessment & Plan: Improving with use of [name] 8 mg + Topiramate 50 mg once daily.  Work on improving water intake to 100 oz/ day Aim for 120-150 g of dietary protein intake daily  Orders: -  [name]; Take 1 tablet (8 mg total) by mouth daily.  Dispense: 30 tablet; Refill: 0 -     Topiramate; Take 1 tablet po daily  Dispense: 30 tablet; Refill: 0  Morbid obesity (HCC): Starting BMI 44.4 -     [name]; Take 1 tablet (8 mg total) by mouth daily.  Dispense: 30 tablet; Refill: 0 -     Topiramate; Take 1 tablet po daily  Dispense: 30 tablet; Refill:  0  BMI 40.0-44.9, adult (HCC) -     [name]; Take 1 tablet (8 mg total) by mouth daily.  Dispense: 30 tablet; Refill: 0 -     Topiramate; Take 1 tablet po daily  Dispense: 30 tablet; Refill: 0  Vitamin D deficiency Assessment & Plan: Last vitamin D Lab Results  Component Value Date   VD25OH 38.6 04/27/2023   Reviewed lab results with patient Vitamin D level is improving on RX vitamin D weekly Energy level still fairly poor  Continue RX vitamin D weekly with a goal > 50 and repeat lab in 3 mos  Orders: -     Vitamin D (Ergocalciferol); Take 1 capsule (50,000 Units total) by mouth every 7 (seven) days.  Dispense: 5 capsule; Refill: 0      He was informed of the importance of frequent follow up visits to maximize his success with intensive lifestyle modifications for his multiple health conditions.   ATTESTASTION STATEMENTS:  Reviewed by clinician on day of visit: allergies, medications, problem list, medical history, surgical history, family history, social history, and previous encounter notes pertinent to obesity diagnosis.   I have personally spent 30 minutes total time today in preparation, patient care, nutritional counseling and documentation for this visit, including the following: review of clinical lab tests; review of medical tests/procedures/services.      Kyle Brink, DO DABFM, DABOM [facility] [address] [place], [place] [address] [phone]

## 2023-05-30 NOTE — Assessment & Plan Note (Signed)
[name] done 10/27/22 for hepatic steatosis Actively working on weight reduction with a  5.9% TBW loss in the past 5 mos of medically supervised weight management ALT remains mildly elevated, reviewed most recent lab results with patient today  Continue active plan for weight reduction If covered, consider change to [facility] given new FDA indication for hepatic steatosis

## 2023-05-30 NOTE — Assessment & Plan Note (Signed)
Last vitamin D Lab Results  Component Value Date   VD25OH 38.6 04/27/2023   Reviewed lab results with patient Vitamin D level is improving on RX vitamin D weekly Energy level still fairly poor  Continue RX vitamin D weekly with a goal > 50 and repeat lab in 3 mos

## 2023-05-30 NOTE — Assessment & Plan Note (Signed)
Improving with use of [name] 8 mg + Topiramate 50 mg once daily.  Work on improving water intake to 100 oz/ day Aim for 120-150 g of dietary protein intake daily

## 2023-06-29 ENCOUNTER — Ambulatory Visit: Payer: 59 | Admitting: Bariatrics

## 2023-06-29 ENCOUNTER — Encounter: Payer: Self-pay | Admitting: Bariatrics

## 2023-06-29 VITALS — BP 111/73 | HR 96 | Temp 98.1°F | Ht 69.0 in | Wt 274.0 lb

## 2023-06-29 DIAGNOSIS — E559 Vitamin D deficiency, unspecified: Secondary | ICD-10-CM | POA: Diagnosis not present

## 2023-06-29 DIAGNOSIS — E66813 Obesity, class 3: Secondary | ICD-10-CM

## 2023-06-29 DIAGNOSIS — Z6841 Body Mass Index (BMI) 40.0 and over, adult: Secondary | ICD-10-CM | POA: Diagnosis not present

## 2023-06-29 DIAGNOSIS — R632 Polyphagia: Secondary | ICD-10-CM | POA: Diagnosis not present

## 2023-06-29 MED ORDER — VITAMIN D (ERGOCALCIFEROL) 1.25 MG (50000 UNIT) PO CAPS: 50000.0000 [IU] | ORAL_CAPSULE | ORAL | 0 refills | Status: DC

## 2023-06-29 MED ORDER — TOPIRAMATE 50 MG PO TABS: ORAL_TABLET | ORAL | 0 refills | Status: DC

## 2023-06-29 MED ORDER — LOMAIRA 8 MG PO TABS: 1.0000 | ORAL_TABLET | Freq: Every day | ORAL | 0 refills | Status: DC

## 2023-06-29 NOTE — Progress Notes (Signed)
WEIGHT SUMMARY AND BIOMETRICS  Weight Lost Since Last Visit: 7lb  Weight Gained Since Last Visit: 0   Vitals Temp: 98.1 F (36.7 C) BP: 111/73 Pulse Rate: 96 SpO2: 100 %   Anthropometric Measurements Height: 5\' 9"  (1.753 m) Weight: 274 lb (124.3 kg) BMI (Calculated): 40.44 Weight at Last Visit: 281lb Weight Lost Since Last Visit: 7lb Weight Gained Since Last Visit: 0 Starting Weight: 301lb Total Weight Loss (lbs): 27 lb (12.2 kg)   Body Composition  Body Fat %: 33.5 % Fat Mass (lbs): 92 lbs Muscle Mass (lbs): 173.6 lbs Total Body Water (lbs): 126.2 lbs Visceral Fat Rating : 17   Other Clinical Data Fasting: no Labs: no Today's Visit #: 9 Starting Date: 12/05/22    OBESITY Breckyn is here to discuss his progress with his obesity treatment plan along with follow-up of his obesity related diagnoses.    Nutrition Plan: the Category 4 plan - 60-65% adherence.  Current exercise: none  Interim History:  He is down 7 lbs since his last visit.  Eating all of the food on the plan., Is not skipping meals, Meeting protein goals., and Water intake is adequate. 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   Pharmacotherapy: Kealan is on [name] 8 mg by mouth  daily,   and Topamax 50 mg.  Adverse side effects: None Hunger is well controlled.  Cravings are moderately controlled.  Assessment/Plan:   Vitamin D Deficiency Vitamin D is not at goal of 50.  Most recent vitamin D level was 38.6. He is on  prescription ergocalciferol 50,000 IU weekly. Lab Results  Component Value Date   VD25OH 38.6 04/27/2023   VD25OH 24.8 (L) 12/05/2022    Plan: Continue prescription vitamin D 50,000 IU weekly.   Polyphagia Donald endorses excessive hunger.  Medication(s) [name], and Topamax Effects of medication:   moderately controlled. Cravings are moderately controlled.   Plan: Medication(s): Topiramate 50 mg daily and [name] 8 mg by mouth  daily Will increase water, protein and fiber to help assuage hunger.  Will minimize foods that have a high glucose index/load to minimize reactive hypoglycemia.     Morbid Obesity: Current BMI BMI (Calculated): 40.44   Pharmacotherapy Plan Continue and refill  [name] 8 mg by mouth  daily and Topamax 50 mg daily   Jahlon is currently in the action stage of change. As such, his goal is to continue with weight loss efforts.  He has agreed to the Category 4 plan.  Exercise goals: For substantial health benefits, adults should do at least 150 minutes (2 hours and 30 minutes) a week of moderate-intensity, or 75 minutes (1 hour and 15 minutes) a week of vigorous-intensity aerobic physical activity, or an equivalent combination of moderate- and vigorous-intensity aerobic activity. Aerobic activity should be performed in episodes of  at least 10 minutes, and preferably, it should be spread throughout the week.  Behavioral modification strategies: increasing lean protein intake, no meal skipping, meal planning , increase water intake, better snacking choices, planning for success, increasing vegetables, decrease snacking , and mindful eating.  Royel has agreed to follow-up with our clinic in 4 weeks.      Objective:   VITALS: Per patient if applicable, see vitals. GENERAL: Alert and in no acute distress. CARDIOPULMONARY: No increased WOB. Speaking in clear sentences.  PSYCH: Pleasant and cooperative. Speech normal rate and rhythm. Affect is appropriate. Insight and judgement are appropriate. Attention is focused, linear, and appropriate.  NEURO: Oriented as arrived to appointment on time with no prompting.   Attestation Statements:    This was prepared with the assistance of Engineer, civil (consulting).  Occasional wrong-word or sound-a-like substitutions may have  occurred due to the inherent limitations of voice recognition   Corinna Capra, DO

## 2023-07-03 ENCOUNTER — Ambulatory Visit: Payer: 59 | Admitting: Family Medicine

## 2023-07-03 NOTE — Progress Notes (Deleted)
   Established Patient Office Visit  Subjective   Patient ID: Burr Soffer, male    DOB: [birth year]  Age: 28 y.o. MRN: [id number]  No chief complaint on file.   HPI  Weight-healthy weight and wellness.  Taking Contrave or phentermine and Topamax?  Asthma-Symbicort?  Painful scar-?  Athlete's foot?-Ciclopirox   The ASCVD Risk score (Arnett DK, et al., 2019) failed to calculate for the following reasons:   The 2019 ASCVD risk score is only valid for ages 55 to 59  Health Maintenance Due  Topic Date Due   COVID-19 Vaccine (1) Never done   Pneumococcal Vaccine 11-28 Years old (1 of 2 - PCV) Never done   INFLUENZA VACCINE  01/05/2023      Objective:     There were no vitals taken for this visit. {Vitals History (Optional):23777}  Physical Exam   No results found for any visits on 07/03/23.      Assessment & Plan:   There are no diagnoses linked to this encounter.   No follow-ups on file.    Sandre Kitty, MD

## 2023-07-14 ENCOUNTER — Other Ambulatory Visit: Payer: Self-pay | Admitting: Bariatrics

## 2023-07-14 DIAGNOSIS — R632 Polyphagia: Secondary | ICD-10-CM

## 2023-07-17 ENCOUNTER — Encounter: Payer: Self-pay | Admitting: Bariatrics

## 2023-07-18 ENCOUNTER — Telehealth: Payer: Self-pay | Admitting: *Deleted

## 2023-07-18 NOTE — Telephone Encounter (Signed)
LVM for pt to call to get his appt rescheduled due to provider not being in office that day. Please assist in getting this rescheduled if pt calls back.

## 2023-07-26 ENCOUNTER — Ambulatory Visit: Payer: 59 | Admitting: Bariatrics

## 2023-07-27 ENCOUNTER — Ambulatory Visit: Payer: 59 | Admitting: Family Medicine

## 2023-08-16 ENCOUNTER — Encounter: Payer: Self-pay | Admitting: Family Medicine

## 2023-08-16 ENCOUNTER — Ambulatory Visit (INDEPENDENT_AMBULATORY_CARE_PROVIDER_SITE_OTHER): Payer: 59 | Admitting: Family Medicine

## 2023-08-16 VITALS — BP 122/80 | HR 84 | Temp 98.2°F | Ht 69.0 in | Wt 270.0 lb

## 2023-08-16 DIAGNOSIS — R632 Polyphagia: Secondary | ICD-10-CM | POA: Diagnosis not present

## 2023-08-16 DIAGNOSIS — E6609 Other obesity due to excess calories: Secondary | ICD-10-CM

## 2023-08-16 DIAGNOSIS — E559 Vitamin D deficiency, unspecified: Secondary | ICD-10-CM

## 2023-08-16 DIAGNOSIS — E66812 Obesity, class 2: Secondary | ICD-10-CM

## 2023-08-16 DIAGNOSIS — R7989 Other specified abnormal findings of blood chemistry: Secondary | ICD-10-CM | POA: Diagnosis not present

## 2023-08-16 DIAGNOSIS — K760 Fatty (change of) liver, not elsewhere classified: Secondary | ICD-10-CM | POA: Diagnosis not present

## 2023-08-16 DIAGNOSIS — Z6839 Body mass index (BMI) 39.0-39.9, adult: Secondary | ICD-10-CM

## 2023-08-16 MED ORDER — TOPIRAMATE 50 MG PO TABS: ORAL_TABLET | ORAL | 0 refills | Status: DC

## 2023-08-16 MED ORDER — LOMAIRA 8 MG PO TABS: 1.0000 | ORAL_TABLET | Freq: Every day | ORAL | 0 refills | Status: DC

## 2023-08-16 MED ORDER — VITAMIN D (ERGOCALCIFEROL) 1.25 MG (50000 UNIT) PO CAPS: 50000.0000 [IU] | ORAL_CAPSULE | ORAL | 0 refills | Status: DC

## 2023-08-16 NOTE — Progress Notes (Signed)
 Office: [phone]  /  Fax: [phone]  WEIGHT SUMMARY AND BIOMETRICS  Starting Date: 12/05/22  Starting Weight: 301lb   Weight Lost Since Last Visit: 4lb   Vitals Temp: 98.2 F (36.8 C) BP: 122/80 Pulse Rate: 84 SpO2: 99 %   Body Composition  Body Fat %: 32.9 % Fat Mass (lbs): 88.8 lbs Muscle Mass (lbs): 172.6 lbs Total Body Water (lbs): 126.2 lbs Visceral Fat Rating : 16    HPI  Chief Complaint: OBESITY  [name] is here to discuss his progress with his obesity treatment plan. He is on the the Category 4 Plan and states he is following his eating plan approximately 75 % of the time. He states he is exercising 0 minutes 0 times per week.  Interval History:  Since last office visit he is down 4 lb He is down 1 lb of muscle mass and down 3.2 lb of body fat since last visit 1/23 He has been tired, getting 5-6 hrs of sleep at night, trouble falling asleep He has a net weight loss of 31 lb in 8 mos of medically supervised weight management This is an 11.3% TBW loss He has added in more greasy meats, fast food He is struggling with fatigue and meal planning His job remains active His kids are picky eaters He remains on [name] and Topiramate with improved appetite and food impulse control  Pharmacotherapy: [name] 8 mg + Topiramate 50 mg tab once daily  PHYSICAL EXAM:  Blood pressure 122/80, pulse 84, temperature 98.2 F (36.8 C), height 5\' 9"  (1.753 m), weight 270 lb (122.5 kg), SpO2 99%. Body mass index is 39.87 kg/m.  General: He is overweight, cooperative, alert, well developed, and in no acute distress. PSYCH: Has normal mood, affect and thought process.   Lungs: Normal breathing effort, no conversational dyspnea.   ASSESSMENT AND PLAN  TREATMENT PLAN FOR OBESITY:  Recommended Dietary Goals  Tobechukwu is currently in the action stage of change. As such, his goal is to continue weight management plan. He has agreed to the Category 4  Plan.  Behavioral Intervention  We discussed the following Behavioral Modification Strategies today: increasing lean protein intake to established goals, increasing fiber rich foods, increasing water intake , work on meal planning and preparation, keeping healthy foods at home, identifying sources and decreasing liquid calories, avoiding temptations and identifying enticing environmental cues, better snacking choices, and continue to work on maintaining a reduced calorie state, getting the recommended amount of protein, incorporating whole foods, making healthy choices, staying well hydrated and practicing mindfulness when eating..  Additional resources provided today: NA  Recommended Physical Activity Goals  [name] has been advised to work up to 150 minutes of moderate intensity aerobic activity a week and strengthening exercises 2-3 times per week for cardiovascular health, weight loss maintenance and preservation of muscle mass.   He has agreed to Think about enjoyable ways to increase daily physical activity and overcoming barriers to exercise and Increase physical activity in their day and reduce sedentary time (increase NEAT).  Pharmacotherapy changes for the treatment of obesity: none  ASSOCIATED CONDITIONS ADDRESSED TODAY  Hepatic steatosis Ultrasound with elastography reviewed from 10/27/22 showing hepatic steatosis He has lost 11.3% TBW in 8 mos of medically supervised weight management Peak visceral fat rating down 20-->16  Continue active plan for weight reduction.  Aim for body fat 25%  Polyphagia Improved on [name] + Topirmate and still seeing weight reduction despite poor food choices and lack of regular exercise at  times.  BP/ HR WNL.   Continue current medications. [name]; Take 1 tablet (8 mg total) by mouth daily.  Dispense: 30 tablet; Refill: 0 -     Topiramate; Take 1 tablet po daily  Dispense: 30 tablet; Refill: 0  Vitamin D deficiency Last vitamin D Lab  Results  Component Value Date   VD25OH 38.6 04/27/2023  He is doing well on RX vitamin D weekly Energy level still fairly low Repeat lab next visit -     Vitamin D (Ergocalciferol); Take 1 capsule (50,000 Units total) by mouth every 7 (seven) days.  Dispense: 5 capsule; Refill: 0  Class 2 obesity due to excess calories with body mass index (BMI) of 39.0 to 39.9 in adult, unspecified whether serious comorbidity present -     [name]; Take 1 tablet (8 mg total) by mouth daily.  Dispense: 30 tablet; Refill: 0 -     Topiramate; Take 1 tablet po daily  Dispense: 30 tablet; Refill: 0  Low testosterone in male Last total testosterone level 385 11/21 He notes having this repeated outside our clinic and it was similar Though this is not 'low' it is in the low- normal range and he c/o fatigue, difficulty gaining muscle mass.  He was given RX for Enclomiphene 12.5 mg to take 3 x a week (has not yet started) from outside clinic.  He will be due for CMP at his next visit (with fasting insulin, D, A1c)     He was informed of the importance of frequent follow up visits to maximize his success with intensive lifestyle modifications for his multiple health conditions.   ATTESTASTION STATEMENTS:  Reviewed by clinician on day of visit: allergies, medications, problem list, medical history, surgical history, family history, social history, and previous encounter notes pertinent to obesity diagnosis.   I have personally spent 30 minutes total time today in preparation, patient care, nutritional counseling and documentation for this visit, including the following: review of clinical lab tests; review of medical tests/procedures/services.      Glennis Brink, DO DABFM, DABOM [facility] [address] [place], [place] [address] [phone]

## 2023-08-18 ENCOUNTER — Ambulatory Visit: Payer: 59 | Admitting: Family Medicine

## 2023-09-27 ENCOUNTER — Ambulatory Visit (INDEPENDENT_AMBULATORY_CARE_PROVIDER_SITE_OTHER): Admitting: Family Medicine

## 2023-09-27 ENCOUNTER — Encounter: Payer: Self-pay | Admitting: Family Medicine

## 2023-09-27 VITALS — BP 124/85 | HR 91 | Temp 98.2°F | Ht 69.0 in | Wt 265.0 lb

## 2023-09-27 DIAGNOSIS — E6609 Other obesity due to excess calories: Secondary | ICD-10-CM

## 2023-09-27 DIAGNOSIS — R7989 Other specified abnormal findings of blood chemistry: Secondary | ICD-10-CM | POA: Diagnosis not present

## 2023-09-27 DIAGNOSIS — E66812 Obesity, class 2: Secondary | ICD-10-CM

## 2023-09-27 DIAGNOSIS — R632 Polyphagia: Secondary | ICD-10-CM

## 2023-09-27 DIAGNOSIS — E559 Vitamin D deficiency, unspecified: Secondary | ICD-10-CM | POA: Diagnosis not present

## 2023-09-27 DIAGNOSIS — Z6839 Body mass index (BMI) 39.0-39.9, adult: Secondary | ICD-10-CM

## 2023-09-27 MED ORDER — TOPIRAMATE 50 MG PO TABS: ORAL_TABLET | ORAL | 0 refills | Status: DC

## 2023-09-27 MED ORDER — VITAMIN D (ERGOCALCIFEROL) 1.25 MG (50000 UNIT) PO CAPS: 50000.0000 [IU] | ORAL_CAPSULE | ORAL | 0 refills | Status: DC

## 2023-09-27 MED ORDER — LOMAIRA 8 MG PO TABS: 1.0000 | ORAL_TABLET | Freq: Every day | ORAL | 0 refills | Status: DC

## 2023-09-27 NOTE — Progress Notes (Signed)
 Office: [phone]  /  Fax: [phone]  WEIGHT SUMMARY AND BIOMETRICS  Starting Date: 12/05/22  Starting Weight: 301lb   Weight Lost Since Last Visit: 5lb   Vitals Temp: 98.2 F (36.8 C) BP: 124/85 Pulse Rate: 91 SpO2: 99 %   Body Composition  Body Fat %: 32.8 % Fat Mass (lbs): 87 lbs Muscle Mass (lbs): 169.4 lbs Total Body Water (lbs): 130 lbs Visceral Fat Rating : 16   HPI  Chief Complaint: OBESITY  [name] is here to discuss his progress with his obesity treatment plan. He is on the the Category 4 Plan and states he is following his eating plan approximately 90 % of the time. He states he is exercising walking at work 0 minutes 0 times per week.  Interval History:  Since last office visit he is down 5 lb This gives him a net weight loss of 36 lb in 9 mos of medically supervised weight management This is an 11.9% TBW loss He has done well on [name]  8 mg + Topiramate  50 mg qAM He was sick with a GI bug since last visit His job is active He is making better food choices He does indulge in sweet coffee and candy intake  Pharmacotherapy: [name]  8 mg + Topiramate  50 mg qAM  PHYSICAL EXAM:  Blood pressure 124/85, pulse 91, temperature 98.2 F (36.8 C), height 5\' 9"  (1.753 m), weight 265 lb (120.2 kg), SpO2 99%. Body mass index is 39.13 kg/m.  General: He is overweight, cooperative, alert, well developed, and in no acute distress. PSYCH: Has normal mood, affect and thought process.   Lungs: Normal breathing effort, no conversational dyspnea.   ASSESSMENT AND PLAN  TREATMENT PLAN FOR OBESITY:  Recommended Dietary Goals  [name] is currently in the action stage of change. As such, his goal is to continue weight management plan. He has agreed to the Category 4 Plan.  Behavioral Intervention  We discussed the following Behavioral Modification Strategies today: increasing lean protein intake to established goals, increasing fiber rich foods, increasing  water intake , work on meal planning and preparation, work on tracking and journaling calories using tracking application, keeping healthy foods at home, identifying sources and decreasing liquid calories, avoiding temptations and identifying enticing environmental cues, planning for success, and continue to work on maintaining a reduced calorie state, getting the recommended amount of protein, incorporating whole foods, making healthy choices, staying well hydrated and practicing mindfulness when eating..  Additional resources provided today: NA  Recommended Physical Activity Goals  [name] has been advised to work up to 150 minutes of moderate intensity aerobic activity a week and strengthening exercises 2-3 times per week for cardiovascular health, weight loss maintenance and preservation of muscle mass.   He has agreed to Think about enjoyable ways to increase daily physical activity and overcoming barriers to exercise and Increase physical activity in their day and reduce sedentary time (increase NEAT). Add in 30 min of exercise 2 days/ wk  Pharmacotherapy changes for the treatment of obesity: none  ASSOCIATED CONDITIONS ADDRESSED TODAY   Polyphagia Improved on [name]  8 mg tab and topiramate  50 mg tab every morning without adverse side effects Working on improving food choices incorporating more lean protein and fiber with meals Work on increasing water intake Blood pressure and heart rate are within normal limits on current antiobesity medication  Class II obesity with BMI 39 Improving.  Reviewed bioimpedance results with patient. He lacks insurance coverage for use of a GLP-1 receptor agonist He  has room for improvement with food choices, meal planning and regular exercise. He has lost 11.9% of his total body weight in the past 9 months of medically supervised weight management  Vitamin D  deficiency Last vitamin D  Lab Results  Component Value Date   VD25OH 38.6 04/27/2023  He  is on vitamin D  50,000 IU once weekly.  Energy level is improving.  Recheck vitamin D  level next visit with CMP, fasting insulin , and A1c  Low Testosterone  He is seeing an outside clinic in [facility] for low testosterone .  He has been taken off of these and labs are being updated there every month.  He has been feeling more anxious without medication.  He reports his last testosterone  level was in the 200s.   He was informed of the importance of frequent follow up visits to maximize his success with intensive lifestyle modifications for his multiple health conditions.   ATTESTASTION STATEMENTS:  Reviewed by clinician on day of visit: allergies, medications, problem list, medical history, surgical history, family history, social history, and previous encounter notes pertinent to obesity diagnosis.   I have personally spent 31 minutes total time today in preparation, patient care, nutritional counseling and education,  and documentation for this visit, including the following: review of most recent clinical lab tests, prescribing medications/ refilling medications, reviewing medical assistant documentation, review and interpretation of bioimpedence results.     Micky Albee, D.O. DABFM, DABOM [facility] [address] [place], [place] [address] [phone]

## 2023-09-27 NOTE — Patient Instructions (Signed)
 You can try to track your calories using the MyFitnessPal ap Aim for 2000 cal/ day This should include 110-130 g of protein intake daily  PB Fit in overnight oats or mix in low sugar vanilla greek yogurt to make a PB dip for apple slices   Fasting labs next visit  Limit high sugar foods and drinks  Aim for 30 min of exercise outside of work 2 x a week

## 2023-10-12 ENCOUNTER — Other Ambulatory Visit: Payer: Self-pay

## 2023-10-12 ENCOUNTER — Encounter: Payer: Self-pay | Admitting: *Deleted

## 2023-10-12 ENCOUNTER — Ambulatory Visit
Admission: EM | Admit: 2023-10-12 | Discharge: 2023-10-12 | Disposition: A | Discharge status: Home / Self Care | Attending: Family Medicine | Admitting: Family Medicine

## 2023-10-12 DIAGNOSIS — L600 Ingrowing nail: Secondary | ICD-10-CM

## 2023-10-12 IMAGING — DX DG CHEST 1V PORT
1 series · 1 of 1 positions shown · non-contrast
Comparison: None.

CLINICAL DATA: Productive cough and fever for 3 days.

EXAM:
PORTABLE CHEST 1 VIEW

[chest ap]
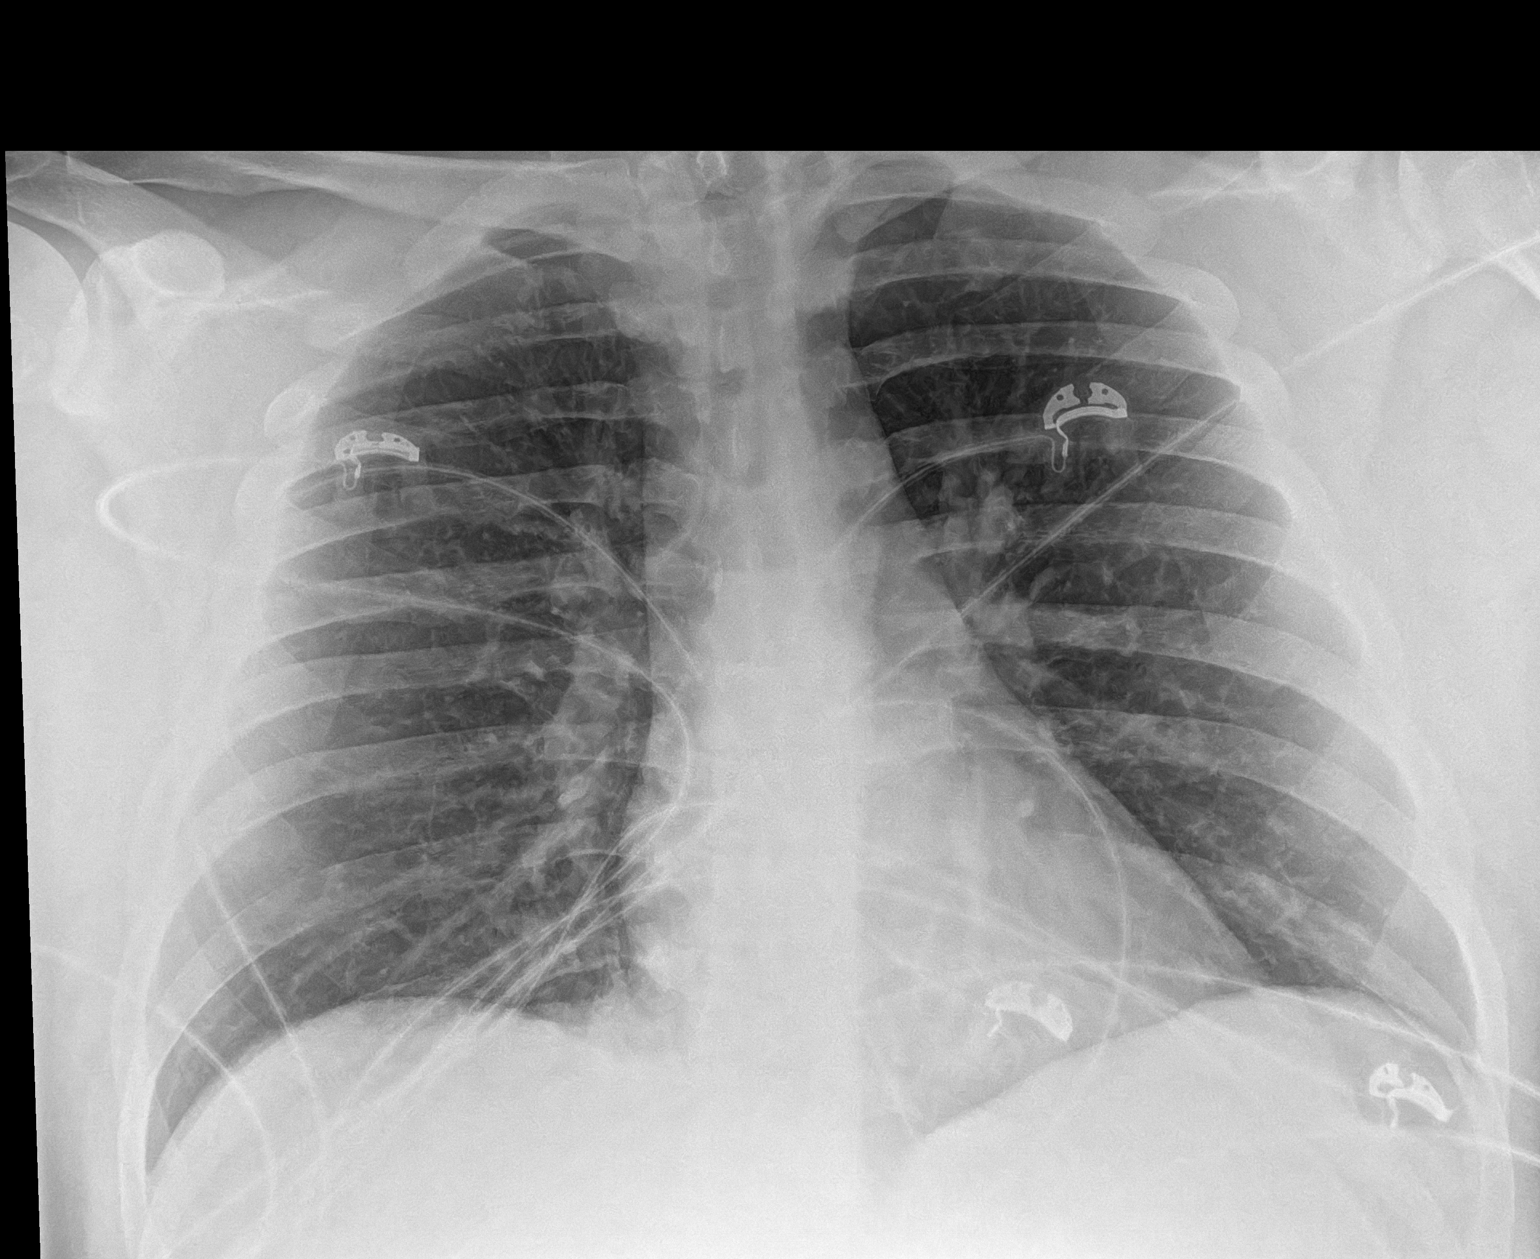

[1 of 1 positions shown; findings below may reference images not displayed]

FINDINGS: Normal heart, mediastinum and hila.

Clear lungs.  No pleural effusion or pneumothorax.

Skeletal structures are grossly intact.
IMPRESSION: No active disease.

## 2023-10-12 IMAGING — CT CT ANGIO CHEST
2 of 7 series · 17 of 46 positions shown · IV contrast (agent unspecified)
Comparison: Chest radiography same day

CLINICAL DATA: Pulmonary embolism suspected. Positive D-dimer.
Cough and congestion.

EXAM:
CT ANGIOGRAPHY CHEST WITH CONTRAST
TECHNIQUE: Multidetector CT imaging of the chest was performed using the
standard protocol during bolus administration of intravenous
contrast. Multiplanar CT image reconstructions and MIPs were
obtained to evaluate the vascular anatomy.

[Series 5: pe axial thins · axial · 0.98mm/px · z∈[-41,+226]mm · 14 of 309 slices shown]
[im 21/309  lung]
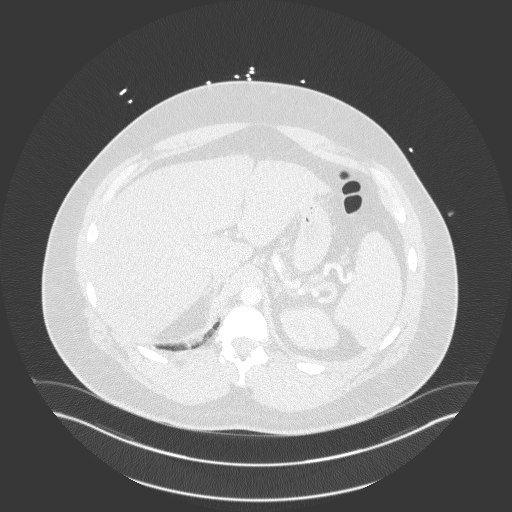
[im 42/309  soft-tissue]
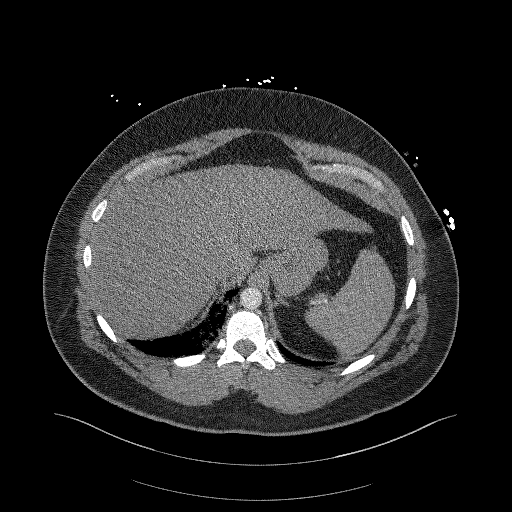
[im 62/309  lung]
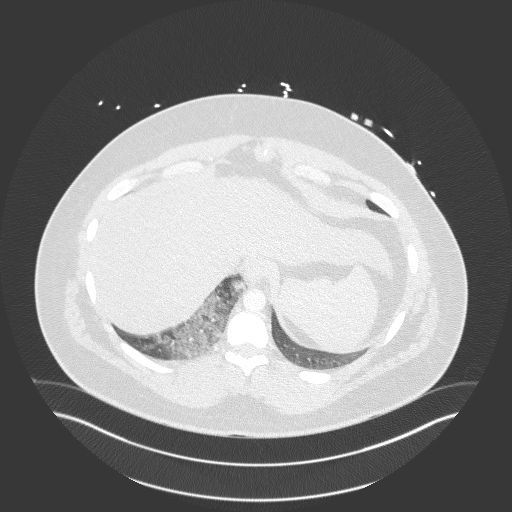
[im 83/309  soft-tissue]
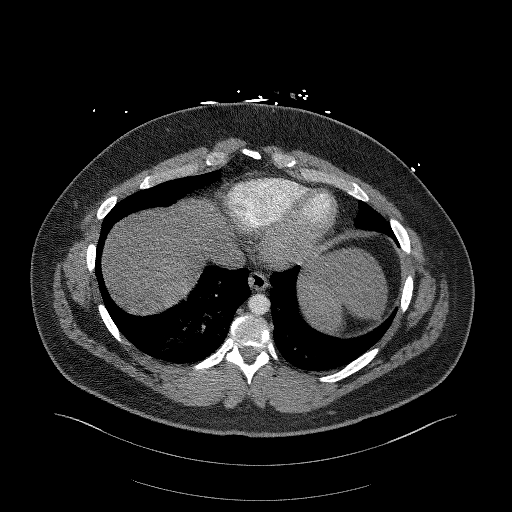
[im 103/309  lung]
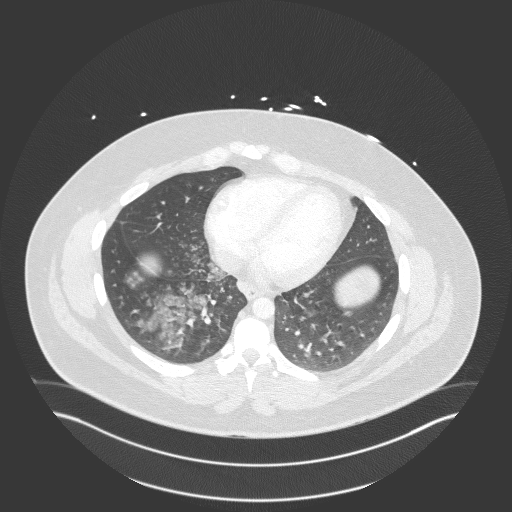
[im 124/309  soft-tissue]
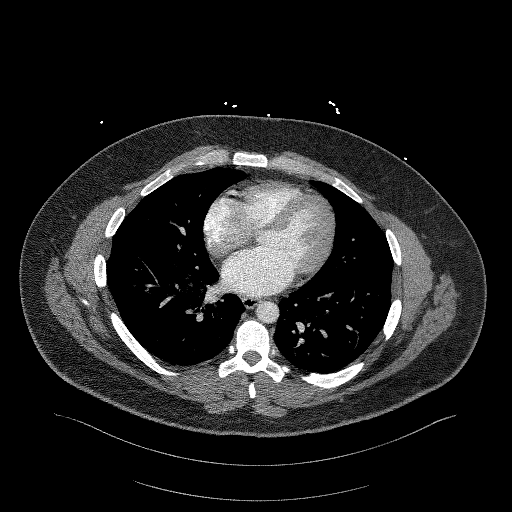
[im 144/309  lung]
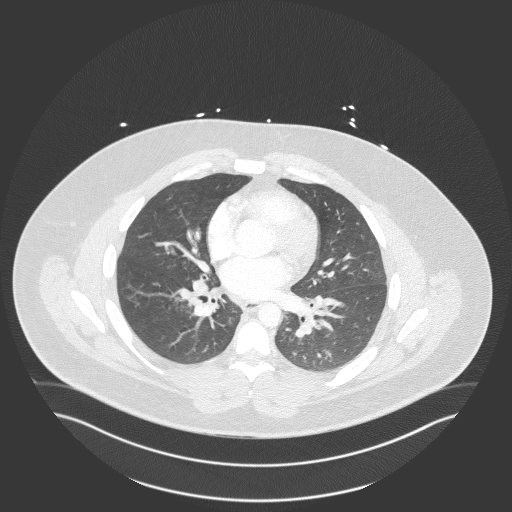
[im 165/309  soft-tissue]
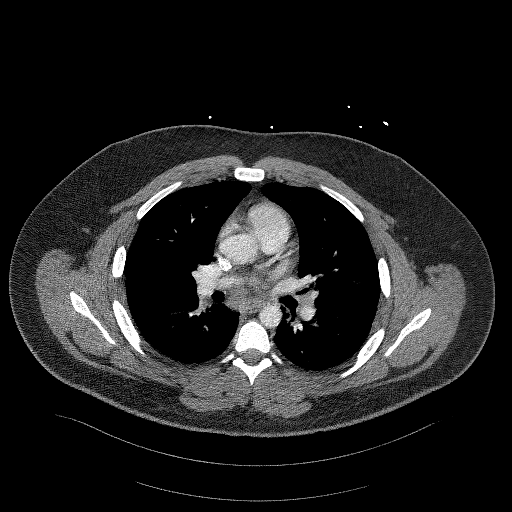
[im 185/309  lung]
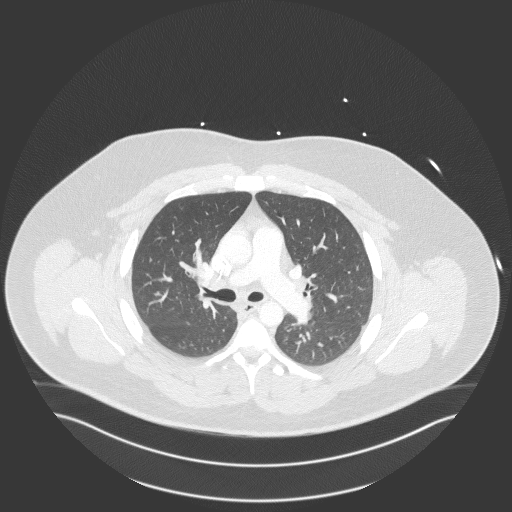
[im 206/309  soft-tissue]
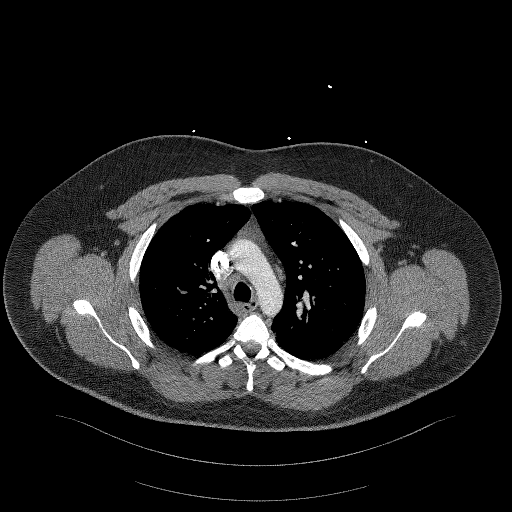
[im 226/309  lung]
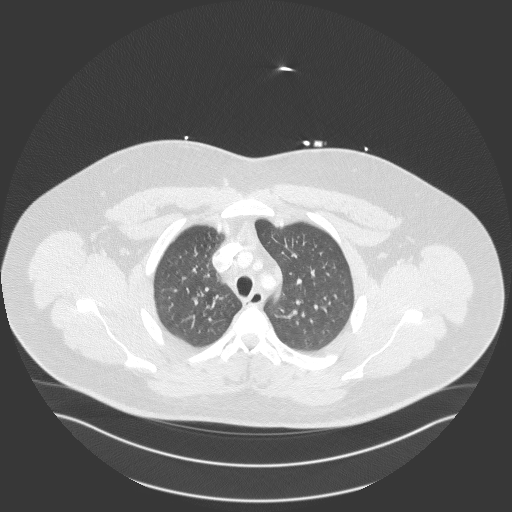
[im 247/309  soft-tissue]
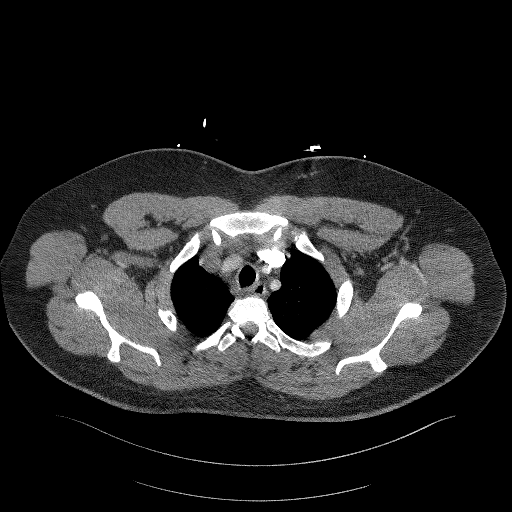
[im 267/309  lung]
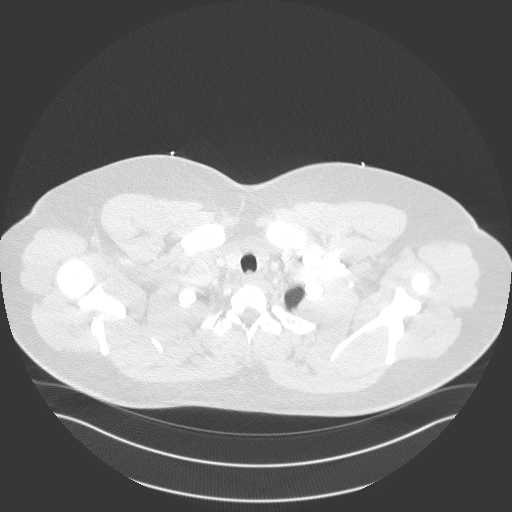
[im 288/309  soft-tissue]
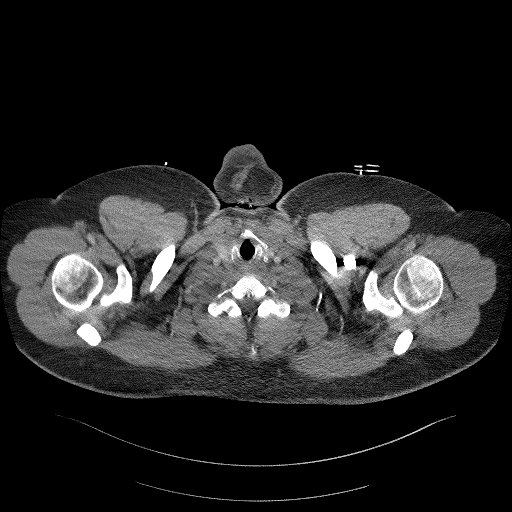

[Series 7: cor soft · coronal · 0.69mm/px · 3 of 157 slices shown]
[im 40/157  soft-tissue]
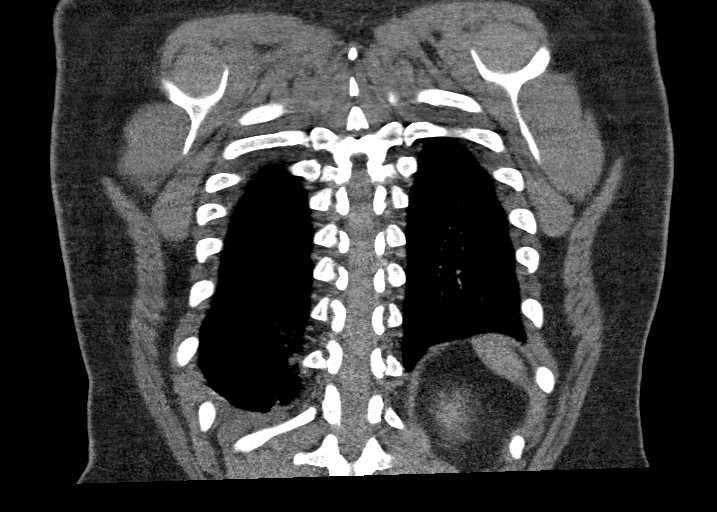
[im 79/157  soft-tissue]
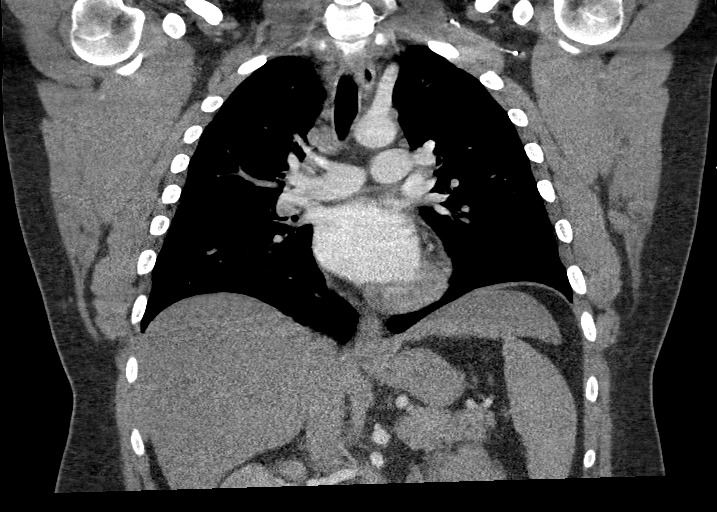
[im 118/157  soft-tissue]
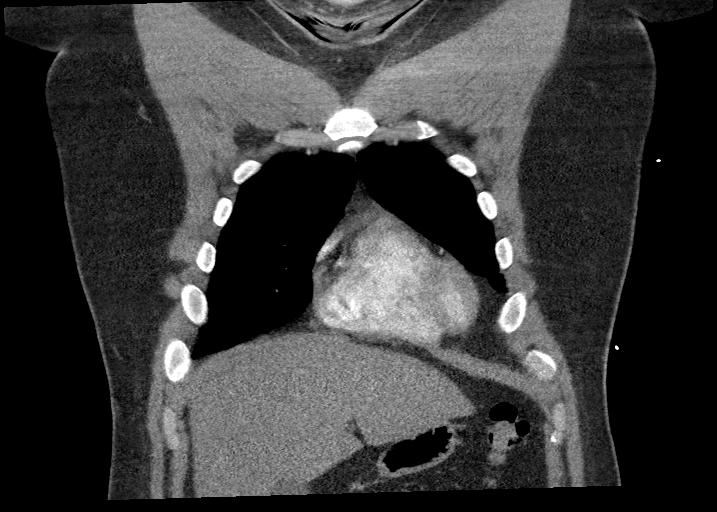

[17 of 46 positions shown; findings below may reference images not displayed]

RADIATION DOSE REDUCTION: This exam was performed according to the
departmental dose-optimization program which includes automated
exposure control, adjustment of the mA and/or kV according to
patient size and/or use of iterative reconstruction technique.

CONTRAST:  100mL OMNIPAQUE IOHEXOL 350 MG/ML SOLN
FINDINGS: Cardiovascular: Heart size is normal. No pericardial fluid. No
visible coronary artery calcification or aortic atherosclerotic
calcification. Pulmonary arterial opacification is moderate. No
pulmonary emboli are identified.

Mediastinum/Nodes: Mild reactive nodal prominence.

Lungs/Pleura: Extensive bronchopneumonia within the right lower lobe
with minimal involvement of the right middle lobe. Right upper lobe
is clear. Mild patchy infiltrate in the left lower lobe. Left upper
lobe is clear. No pleural effusion.

Upper Abdomen: Normal

Musculoskeletal: Normal

Review of the MIP images confirms the above findings.
IMPRESSION: No pulmonary emboli.

Extensive bronchopneumonia in the right lower lobe. Minimal patchy
involvement also of the right middle lobe and left lower lobe.

## 2023-10-12 MED ORDER — DOXYCYCLINE HYCLATE 100 MG PO CAPS: 100.0000 mg | ORAL_CAPSULE | Freq: Two times a day (BID) | ORAL | 0 refills | Status: AC

## 2023-10-12 NOTE — ED Triage Notes (Signed)
 Pt states he tries to treat an ingrown toenail on Tuesday (L great toe) and now believes it is infected

## 2023-10-12 NOTE — ED Provider Notes (Signed)
  [facility]   [id number] 10/12/23 Arrival Time: 1019  ASSESSMENT & PLAN:  1. Ingrown left greater toenail    Podiatry referral placed. Orders Placed This Encounter  Procedures   Ambulatory referral to Podiatry    Referral Priority:   Routine    Referral Type:   Consultation    Referral Reason:   Specialty Services Required    Requested Specialty:   Podiatry    Number of Visits Requested:   1   Post-op shoe for comfort. Work note provided.  Begin: New Prescriptions   DOXYCYCLINE (VIBRAMYCIN) 100 MG CAPSULE    Take 1 capsule (100 mg total) by mouth 2 (two) times daily.    Reviewed expectations re: course of current medical issues. Questions answered. Outlined signs and symptoms indicating need for more acute intervention. Patient verbalized understanding. After Visit Summary given.  SUBJECTIVE: History from: patient. [name] is a 28 y.o. male who reports ingrown nail of LEFT great toe; few days; minor home surgery without relief; scant yellowish drainage around nailfold today; is painful. Denies fever.  Past Surgical History:  Procedure Laterality Date   LEG SURGERY Left    r/t sepsis      OBJECTIVE:  Vitals:   10/12/23 1056  BP: (!) 151/82  Pulse: 90  Resp: 16  Temp: 97.7 F (36.5 C)  TempSrc: Oral  SpO2: 96%    General appearance: alert; no distress Extremities: LLE: warm with well perfused appearance; nailfold of lateral great toe is inflamed/red with scant purulent drainage CV: brisk extremity capillary refill of LLE; 2+ DP pulse of LLE. Neurologic: normal sensation and strength of LLE Psychological: alert and cooperative; normal mood and affect    Allergies  Allergen Reactions   Fish Allergy Nausea And Vomiting    Past Medical History:  Diagnosis Date   Asthma    Hyponatremia 08/04/2021   Social History   Socioeconomic History   Marital status: Single    Spouse name: Not on file   Number of children: Not on file    Years of education: Not on file   Highest education level: Not on file  Occupational History   Not on file  Tobacco Use   Smoking status: Former    Current packs/day: 0.00    Types: Cigarettes    Quit date: 08/20/2021    Years since quitting: 2.1   Smokeless tobacco: Current   Tobacco comments:    ZYN pouches  Vaping Use   Vaping status: Never Used  Substance and Sexual Activity   Alcohol use: Not Currently   Drug use: Never   Sexual activity: Not on file  Other Topics Concern   Not on file  Social History Narrative   Not on file   Social Drivers of Health   Financial Resource Strain: Not on file  Food Insecurity: Not on file  Transportation Needs: Not on file  Physical Activity: Not on file  Stress: Not on file  Social Connections: Unknown (10/17/2021)   [facility]   Social Network    Social Network: Not on file   Family History  Problem Relation Age of Onset   Diabetes Mother    Cancer Mother    Cancer Father    Past Surgical History:  Procedure Laterality Date   LEG SURGERY Left    r/t sepsis       Afton Albright, MD 10/12/23 1109

## 2023-10-23 ENCOUNTER — Ambulatory Visit: Admitting: Family Medicine

## 2023-10-24 ENCOUNTER — Encounter: Payer: Self-pay | Admitting: Family Medicine

## 2023-10-24 ENCOUNTER — Other Ambulatory Visit: Payer: Self-pay | Admitting: Family Medicine

## 2023-10-24 ENCOUNTER — Ambulatory Visit (INDEPENDENT_AMBULATORY_CARE_PROVIDER_SITE_OTHER): Admitting: Family Medicine

## 2023-10-24 VITALS — BP 126/88 | HR 96 | Temp 98.0°F | Ht 69.0 in | Wt 257.0 lb

## 2023-10-24 DIAGNOSIS — R632 Polyphagia: Secondary | ICD-10-CM | POA: Diagnosis not present

## 2023-10-24 DIAGNOSIS — K625 Hemorrhage of anus and rectum: Secondary | ICD-10-CM | POA: Diagnosis not present

## 2023-10-24 DIAGNOSIS — E6609 Other obesity due to excess calories: Secondary | ICD-10-CM

## 2023-10-24 DIAGNOSIS — E66812 Obesity, class 2: Secondary | ICD-10-CM

## 2023-10-24 DIAGNOSIS — R7303 Prediabetes: Secondary | ICD-10-CM

## 2023-10-24 DIAGNOSIS — E559 Vitamin D deficiency, unspecified: Secondary | ICD-10-CM | POA: Diagnosis not present

## 2023-10-24 DIAGNOSIS — Z6839 Body mass index (BMI) 39.0-39.9, adult: Secondary | ICD-10-CM

## 2023-10-24 MED ORDER — LOMAIRA 8 MG PO TABS: 1.0000 | ORAL_TABLET | Freq: Every day | ORAL | 0 refills | Status: AC

## 2023-10-24 MED ORDER — TOPIRAMATE 50 MG PO TABS: ORAL_TABLET | ORAL | 0 refills | Status: AC

## 2023-10-24 MED ORDER — VITAMIN D (ERGOCALCIFEROL) 1.25 MG (50000 UNIT) PO CAPS: 50000.0000 [IU] | ORAL_CAPSULE | ORAL | 0 refills | Status: AC

## 2023-10-24 NOTE — Progress Notes (Signed)
 Office: [phone]  /  Fax: [phone]  WEIGHT SUMMARY AND BIOMETRICS  Starting Date: 12/05/22  Starting Weight: 301lb   Weight Lost Since Last Visit: 8lb   Vitals Temp: 98 F (36.7 C) BP: 126/88 Pulse Rate: 96 SpO2: 98 %   Body Composition  Body Fat %: 31.3 % Fat Mass (lbs): 80.6 lbs Muscle Mass (lbs): 168.6 lbs Total Body Water (lbs): 128 lbs Visceral Fat Rating : 14    HPI  Chief Complaint: OBESITY  [name] is here to discuss his progress with his obesity treatment plan. He is on the the Category 4 Plan and states he is following his eating plan approximately 95 % of the time. He states he is exercising 30-60 minutes 2 times per week.  Interval History:  Since last office visit he is down 8 lb He is down 0.8 lb of muscle mass and down 6.4 lb of body fat in the past month This gives him a net weight loss of 44 lb in 10 mos of medically supervised weight management This is a 14.6% TBW loss using [name]  + Topiramate  + cat 4 meal plan + exercise 2 days/ wk He has been more active with walking and doing pull ups He has been more constipation and has reported 'bloody stools' over the past month Denies abdominal bloating or cramping No straining with BMs He has been able to reduce his sugar intake He is getting in more water and fiber in his diet  Pharmacotherapy: [name]  8 mg tab + Topirmate 50 mg tab at the start of the day He lacks insurance coverage for GLP-1 RA  PHYSICAL EXAM:  Blood pressure 126/88, pulse 96, temperature 98 F (36.7 C), height 5\' 9"  (1.753 m), weight 257 lb (116.6 kg), SpO2 98%. Body mass index is 37.95 kg/m.  General: He is overweight, cooperative, alert, well developed, and in no acute distress. PSYCH: Has normal mood, affect and thought process.   Lungs: Normal breathing effort, no conversational dyspnea.   ASSESSMENT AND PLAN  TREATMENT PLAN FOR OBESITY:  Recommended Dietary Goals  [name] is currently in the action stage  of change. As such, his goal is to continue weight management plan. He has agreed to the Category 4 Plan.  Behavioral Intervention  We discussed the following Behavioral Modification Strategies today: increasing fiber rich foods, increasing water intake , keeping healthy foods at home, decreasing eating out or consumption of processed foods, and making healthy choices when eating convenient foods, work on managing stress, creating time for self-care and relaxation, continue to work on implementation of reduced calorie nutritional plan, continue to practice mindfulness when eating, planning for success, and continue to work on maintaining a reduced calorie state, getting the recommended amount of protein, incorporating whole foods, making healthy choices, staying well hydrated and practicing mindfulness when eating..  Additional resources provided today: NA  Recommended Physical Activity Goals  [name] has been advised to work up to 150 minutes of moderate intensity aerobic activity a week and strengthening exercises 2-3 times per week for cardiovascular health, weight loss maintenance and preservation of muscle mass.   He has agreed to Increase the intensity, frequency or duration of strengthening exercises  and Increase the intensity, frequency or duration of aerobic exercises    Pharmacotherapy changes for the treatment of obesity: none  ASSOCIATED CONDITIONS ADDRESSED TODAY  Pre-diabetes Lab Results  Component Value Date   HGBA1C 5.8 (H) 09/23/2022   He has done well with a 14.6% TBW loss in 10  mos He is actively working on reducing intake of sugar intake and increasing walking time and resistance training, limited by time -     Comprehensive metabolic panel with GFR -     Insulin , random -     Hemoglobin A1c  Vitamin D  deficiency Last vitamin D  Lab Results  Component Value Date   VD25OH 38.6 04/27/2023  Doing well on RX vitamin D  weekly Repeat lab today with a goal >50  -      Vitamin D  (Ergocalciferol ); Take 1 capsule (50,000 Units total) by mouth every 7 (seven) days.  Dispense: 5 capsule; Refill: 0 -     VITAMIN D  25 Hydroxy (Vit-D Deficiency, Fractures)  Polyphagia Improved with consistent 1-2 lb of weight loss per week on [name]  + Topiramate  once daily. BP/ HR are WNL Constipation may be the only unwanted SE Will work on fiber intake, water intake to help ease symptoms -     Topiramate ; Take 1 tablet po daily  Dispense: 30 tablet; Refill: 0 -     [name] ; Take 1 tablet (8 mg total) by mouth daily.  Dispense: 30 tablet; Refill: 0  Class 2 obesity due to excess calories with body mass index (BMI) of 39.0 to 39.9 in adult, unspecified whether serious comorbidity present -     Topiramate ; Take 1 tablet po daily  Dispense: 30 tablet; Refill: 0 -     [name] ; Take 1 tablet (8 mg total) by mouth daily.  Dispense: 30 tablet; Refill: 0  Rectal bleeding New finding He reports a hx of hemorrhoids but has had on and off blood mixed in the toilet bowl x 1 month (bright red).  Denies feeling constipated or straining with BM.  Denies abdominal cramping or pain.  Will get him back in with Dr Arabella Beach for further eval and treatment.     He was informed of the importance of frequent follow up visits to maximize his success with intensive lifestyle modifications for his multiple health conditions.   ATTESTASTION STATEMENTS:  Reviewed by clinician on day of visit: allergies, medications, problem list, medical history, surgical history, family history, social history, and previous encounter notes pertinent to obesity diagnosis.   I have personally spent 30 minutes total time today in preparation, patient care, nutritional counseling and education,  and documentation for this visit, including the following: review of most recent clinical lab tests, prescribing medications/ refilling medications, reviewing medical assistant documentation, review and interpretation of bioimpedence  results.     Micky Albee, D.O. DABFM, DABOM [facility] [address] [place], [place] [address] [phone]

## 2023-10-25 ENCOUNTER — Ambulatory Visit: Payer: Self-pay | Admitting: Family Medicine

## 2023-10-25 LAB — VITAMIN D 25 HYDROXY (VIT D DEFICIENCY, FRACTURES): Vit D, 25-Hydroxy: 54.8 ng/mL (ref 30.0–100.0)

## 2023-10-25 LAB — COMPREHENSIVE METABOLIC PANEL WITH GFR
ALT: 37 IU/L (ref 0–44)
AST: 24 IU/L (ref 0–40)
Albumin: 4.8 g/dL (ref 4.3–5.2)
Alkaline Phosphatase: 84 IU/L (ref 44–121)
BUN/Creatinine Ratio: 14 (ref 9–20)
BUN: 17 mg/dL (ref 6–20)
Bilirubin Total: 0.3 mg/dL (ref 0.0–1.2)
CO2: 20 mmol/L (ref 20–29)
Calcium: 9.4 mg/dL (ref 8.7–10.2)
Chloride: 103 mmol/L (ref 96–106)
Creatinine, Ser: 1.21 mg/dL (ref 0.76–1.27)
Globulin, Total: 2.3 g/dL (ref 1.5–4.5)
Glucose: 88 mg/dL (ref 70–99)
Potassium: 4.1 mmol/L (ref 3.5–5.2)
Sodium: 141 mmol/L (ref 134–144)
Total Protein: 7.1 g/dL (ref 6.0–8.5)
eGFR: 83 mL/min/{1.73_m2} (ref >59)

## 2023-10-25 LAB — HEMOGLOBIN A1C
Est. average glucose Bld gHb Est-mCnc: 108 mg/dL
Hgb A1c MFr Bld: 5.4 % (ref 4.8–5.6)

## 2023-10-25 LAB — INSULIN, RANDOM: INSULIN: 11.1 u[IU]/mL (ref 2.6–24.9)

## 2023-11-22 IMAGING — CT CT CHEST W/O CM
2 of 4 series · 15 of 36 positions shown, 18 images · non-contrast
Comparison: Chest x-ray from earlier in the same day, CT from
08/01/2021

CLINICAL DATA: Shortness of breath



[Series 2: thorax · axial · 0.89mm/px · z∈[-260,-38]mm · 12 of 133 slices shown, 15 images]
[im 11/133  mediastinal]
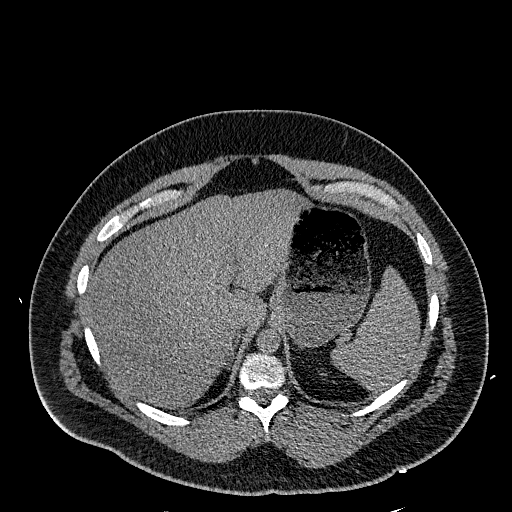
[im 11/133  lung]
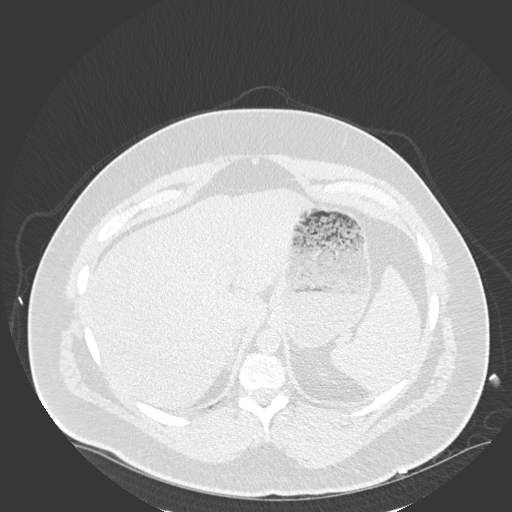
[im 21/133  lung]
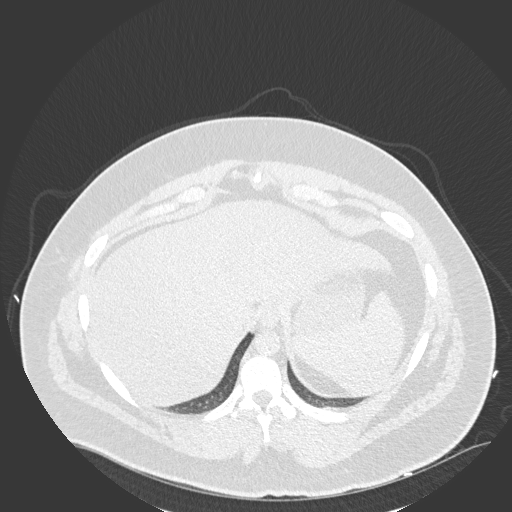
[im 31/133  lung]
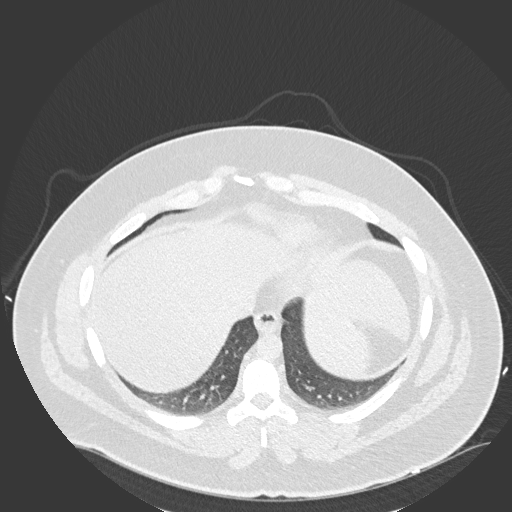
[im 41/133  lung]
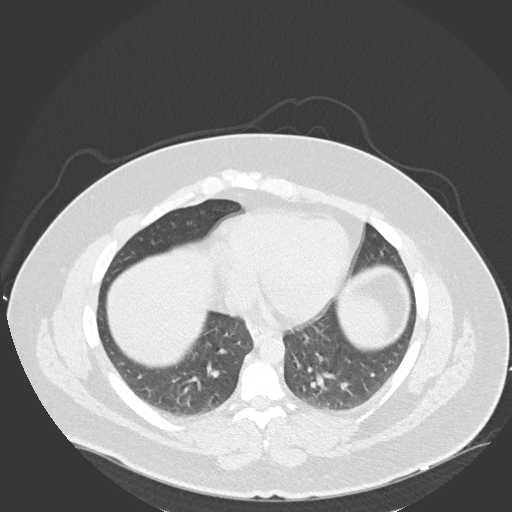
[im 51/133  mediastinal]
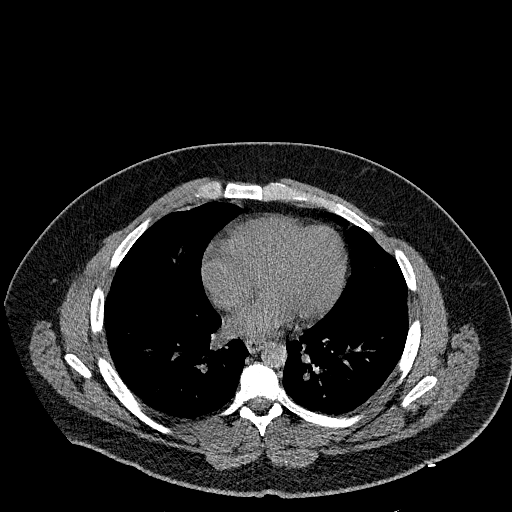
[im 51/133  lung]
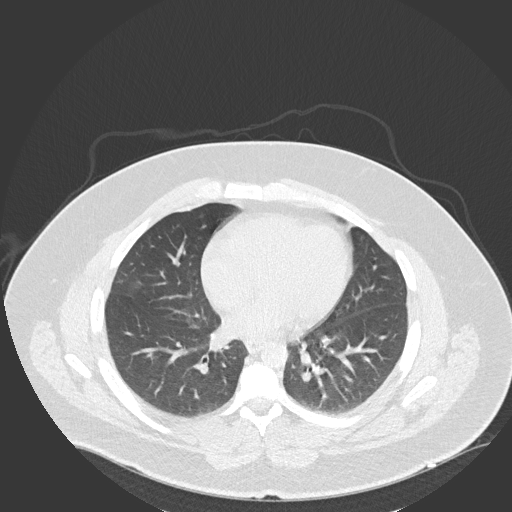
[im 61/133  lung]
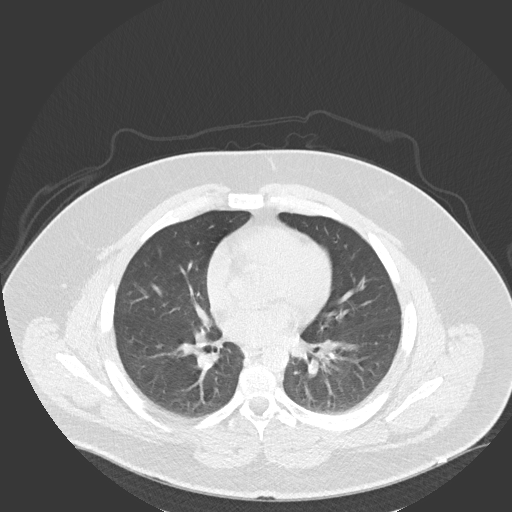
[im 72/133  lung]
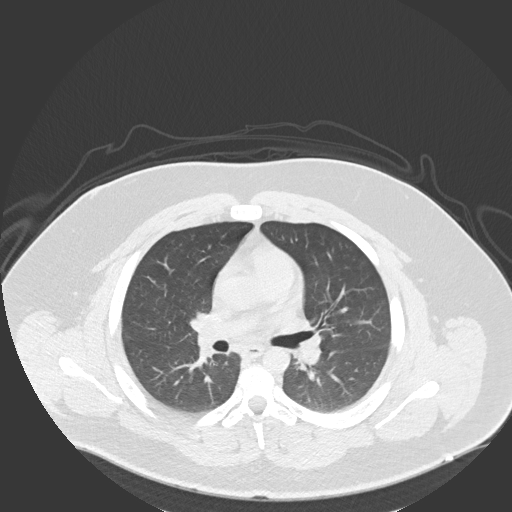
[im 82/133  lung]
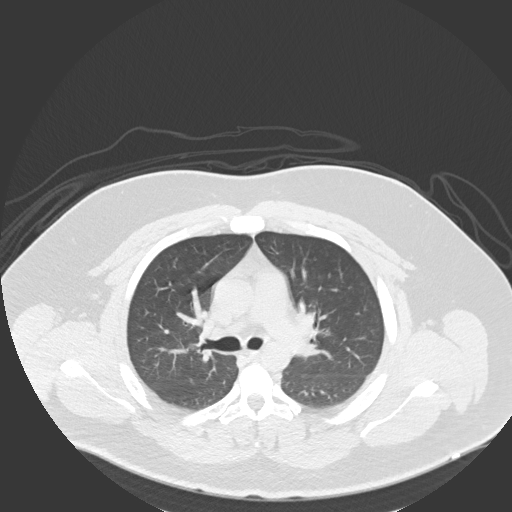
[im 92/133  mediastinal]
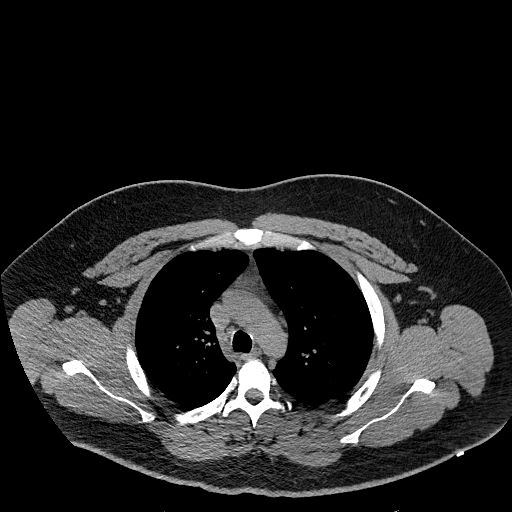
[im 92/133  lung]
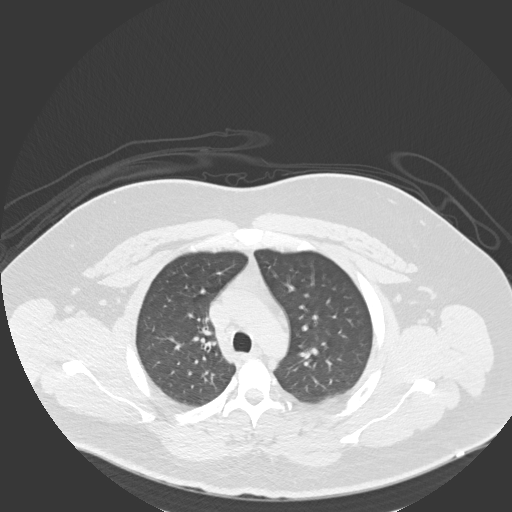
[im 102/133  lung]
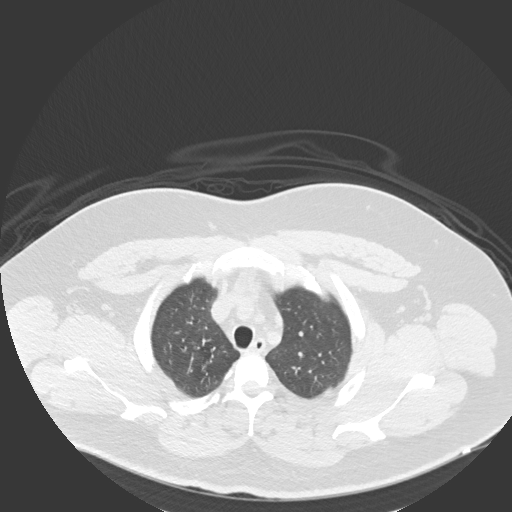
[im 112/133  lung]
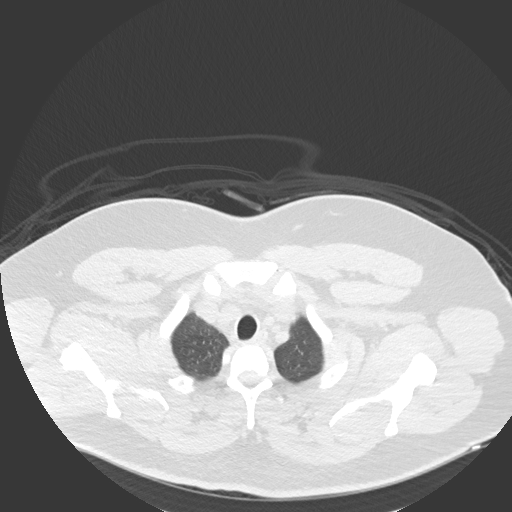
[im 122/133  lung]
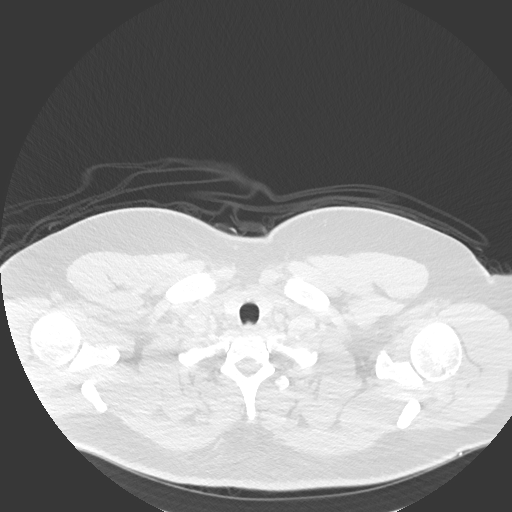

[Series 5: coronal · coronal · 0.59mm/px · 3 of 160 slices shown]
[im 32/160  lung]
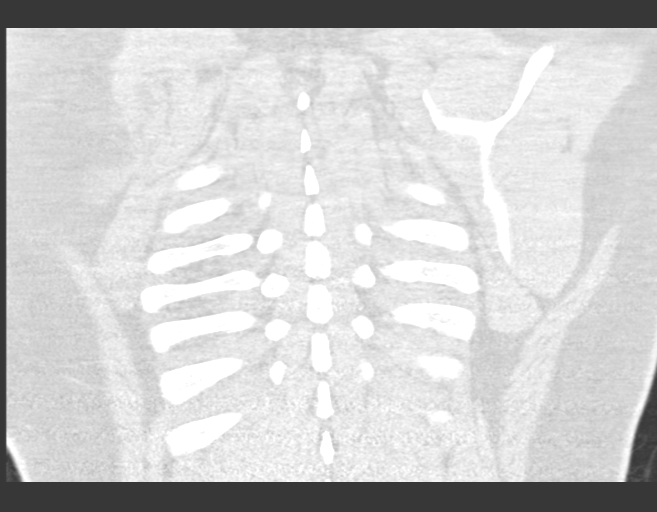
[im 64/160  lung]
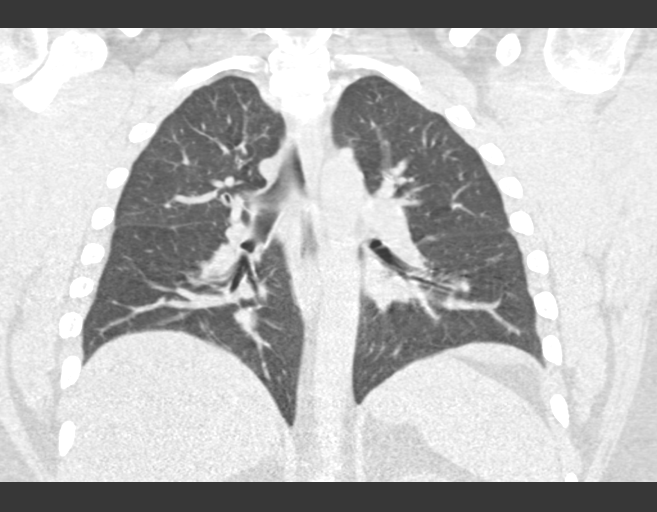
[im 96/160  lung]
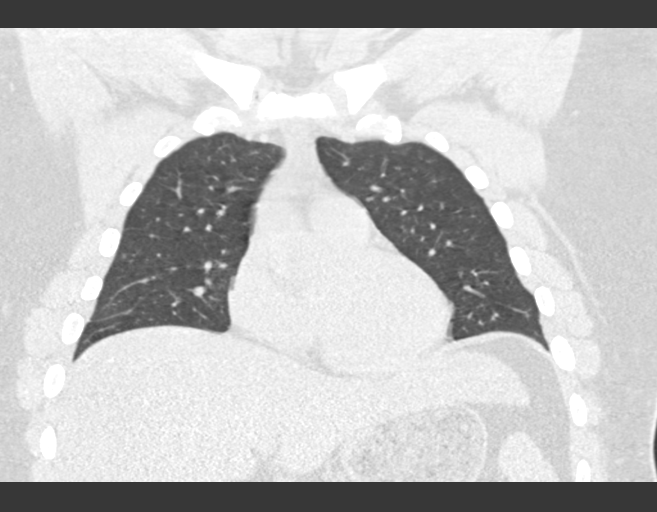

[15 of 36 positions shown; findings below may reference images not displayed]

FINDINGS: Cardiovascular: Limited due to lack of IV contrast. Thoracic aorta
and its branches appear within normal limits. No cardiac enlargement
is seen. No enlargement of the pulmonary artery is noted.

Mediastinum/Nodes: Thoracic inlet is within normal limits. No
sizable hilar or mediastinal adenopathy is noted. The esophagus is
within normal limits as visualized.

Lungs/Pleura: Lungs are well aerated bilaterally. Previously seen
right lower lobe pneumonia has resolved in the interval. No sizable
effusion is noted. No parenchymal nodules are seen.

Upper Abdomen: Visualized upper abdomen shows no acute abnormality.

Musculoskeletal: No chest wall mass or suspicious bone lesions
identified.
IMPRESSION: No acute abnormality is identified.

## 2023-11-27 ENCOUNTER — Ambulatory Visit: Admitting: Family Medicine
